# Patient Record
Sex: Female | Born: 1935 | Race: White | Hispanic: No | Marital: Single | Smoking: Never smoker
Health system: Southern US, Community
[De-identification: ages and names within clinical notes are randomized; demographics above are authoritative.]

## PROBLEM LIST (undated history)

## (undated) DIAGNOSIS — M1990 Unspecified osteoarthritis, unspecified site: Secondary | ICD-10-CM

## (undated) DIAGNOSIS — Y92009 Unspecified place in unspecified non-institutional (private) residence as the place of occurrence of the external cause: Secondary | ICD-10-CM

## (undated) DIAGNOSIS — I351 Nonrheumatic aortic (valve) insufficiency: Secondary | ICD-10-CM

## (undated) DIAGNOSIS — I10 Essential (primary) hypertension: Secondary | ICD-10-CM

## (undated) DIAGNOSIS — K219 Gastro-esophageal reflux disease without esophagitis: Secondary | ICD-10-CM

## (undated) DIAGNOSIS — D759 Disease of blood and blood-forming organs, unspecified: Secondary | ICD-10-CM

## (undated) DIAGNOSIS — I059 Rheumatic mitral valve disease, unspecified: Secondary | ICD-10-CM

## (undated) DIAGNOSIS — I8390 Asymptomatic varicose veins of unspecified lower extremity: Secondary | ICD-10-CM

## (undated) DIAGNOSIS — E7800 Pure hypercholesterolemia, unspecified: Secondary | ICD-10-CM

## (undated) DIAGNOSIS — K5790 Diverticulosis of intestine, part unspecified, without perforation or abscess without bleeding: Secondary | ICD-10-CM

## (undated) DIAGNOSIS — W19XXXA Unspecified fall, initial encounter: Secondary | ICD-10-CM

## (undated) HISTORY — DX: Nonrheumatic aortic (valve) insufficiency: I35.1

## (undated) HISTORY — PX: VAGINAL HYSTERECTOMY: SUR661

## (undated) HISTORY — DX: Asymptomatic varicose veins of unspecified lower extremity: I83.90

## (undated) HISTORY — PX: HERNIA REPAIR: SHX51

## (undated) HISTORY — PX: CATARACT EXTRACTION W/ INTRAOCULAR LENS  IMPLANT, BILATERAL: SHX1307

---

## 1998-02-18 ENCOUNTER — Ambulatory Visit (HOSPITAL_COMMUNITY): Admission: RE | Admit: 1998-02-18 | Discharge: 1998-02-18 | Payer: Self-pay | Admitting: Obstetrics and Gynecology

## 1999-01-23 ENCOUNTER — Ambulatory Visit (HOSPITAL_COMMUNITY): Admission: RE | Admit: 1999-01-23 | Discharge: 1999-01-23 | Payer: Self-pay

## 1999-01-23 ENCOUNTER — Encounter: Payer: Self-pay | Admitting: Obstetrics and Gynecology

## 1999-01-30 ENCOUNTER — Ambulatory Visit (HOSPITAL_COMMUNITY): Admission: RE | Admit: 1999-01-30 | Discharge: 1999-01-30 | Payer: Self-pay | Admitting: Obstetrics and Gynecology

## 1999-07-11 ENCOUNTER — Ambulatory Visit (HOSPITAL_COMMUNITY): Admission: RE | Admit: 1999-07-11 | Discharge: 1999-07-11 | Payer: Self-pay | Admitting: Obstetrics and Gynecology

## 1999-07-11 ENCOUNTER — Encounter: Payer: Self-pay | Admitting: Obstetrics and Gynecology

## 1999-12-23 ENCOUNTER — Encounter: Payer: Self-pay | Admitting: Obstetrics and Gynecology

## 1999-12-23 ENCOUNTER — Encounter: Admission: RE | Admit: 1999-12-23 | Discharge: 1999-12-23 | Payer: Self-pay | Admitting: Obstetrics and Gynecology

## 2000-01-06 ENCOUNTER — Ambulatory Visit (HOSPITAL_COMMUNITY): Admission: RE | Admit: 2000-01-06 | Discharge: 2000-01-06 | Payer: Self-pay | Admitting: Gastroenterology

## 2000-12-28 ENCOUNTER — Encounter: Payer: Self-pay | Admitting: Obstetrics and Gynecology

## 2000-12-28 ENCOUNTER — Ambulatory Visit (HOSPITAL_COMMUNITY): Admission: RE | Admit: 2000-12-28 | Discharge: 2000-12-28 | Payer: Self-pay | Admitting: Obstetrics and Gynecology

## 2002-01-04 ENCOUNTER — Ambulatory Visit (HOSPITAL_COMMUNITY): Admission: RE | Admit: 2002-01-04 | Discharge: 2002-01-04 | Payer: Self-pay | Admitting: Obstetrics and Gynecology

## 2002-01-04 ENCOUNTER — Encounter: Payer: Self-pay | Admitting: Obstetrics and Gynecology

## 2003-01-09 ENCOUNTER — Encounter: Payer: Self-pay | Admitting: Obstetrics and Gynecology

## 2003-01-09 ENCOUNTER — Ambulatory Visit (HOSPITAL_COMMUNITY): Admission: RE | Admit: 2003-01-09 | Discharge: 2003-01-09 | Payer: Self-pay | Admitting: Obstetrics and Gynecology

## 2003-05-30 ENCOUNTER — Encounter: Payer: Self-pay | Admitting: Gastroenterology

## 2003-05-30 ENCOUNTER — Encounter: Admission: RE | Admit: 2003-05-30 | Discharge: 2003-05-30 | Payer: Self-pay | Admitting: Gastroenterology

## 2003-07-10 ENCOUNTER — Encounter: Admission: RE | Admit: 2003-07-10 | Discharge: 2003-07-10 | Payer: Self-pay | Admitting: Gastroenterology

## 2004-01-24 ENCOUNTER — Ambulatory Visit (HOSPITAL_COMMUNITY): Admission: RE | Admit: 2004-01-24 | Discharge: 2004-01-24 | Payer: Self-pay | Admitting: Obstetrics and Gynecology

## 2004-09-12 ENCOUNTER — Ambulatory Visit: Payer: Self-pay | Admitting: Hematology & Oncology

## 2005-01-29 ENCOUNTER — Ambulatory Visit (HOSPITAL_COMMUNITY): Admission: RE | Admit: 2005-01-29 | Discharge: 2005-01-29 | Payer: Self-pay | Admitting: Obstetrics and Gynecology

## 2005-03-17 ENCOUNTER — Ambulatory Visit: Payer: Self-pay | Admitting: Hematology & Oncology

## 2005-03-20 ENCOUNTER — Ambulatory Visit (HOSPITAL_COMMUNITY): Admission: RE | Admit: 2005-03-20 | Discharge: 2005-03-20 | Payer: Self-pay | Admitting: Hematology & Oncology

## 2005-09-16 ENCOUNTER — Ambulatory Visit: Payer: Self-pay | Admitting: Hematology & Oncology

## 2005-12-23 ENCOUNTER — Encounter: Payer: Self-pay | Admitting: Obstetrics and Gynecology

## 2006-01-14 ENCOUNTER — Encounter: Admission: RE | Admit: 2006-01-14 | Discharge: 2006-01-14 | Payer: Self-pay | Admitting: Gastroenterology

## 2006-02-03 ENCOUNTER — Ambulatory Visit (HOSPITAL_COMMUNITY): Admission: RE | Admit: 2006-02-03 | Discharge: 2006-02-03 | Payer: Self-pay | Admitting: Obstetrics and Gynecology

## 2006-02-12 ENCOUNTER — Encounter: Admission: RE | Admit: 2006-02-12 | Discharge: 2006-02-12 | Payer: Self-pay | Admitting: Gastroenterology

## 2006-03-30 ENCOUNTER — Ambulatory Visit: Payer: Self-pay | Admitting: Hematology & Oncology

## 2006-04-01 LAB — CBC WITH DIFFERENTIAL/PLATELET
BASO%: 0.4 % (ref 0.0–2.0)
EOS%: 1.7 % (ref 0.0–7.0)
HCT: 40 % (ref 34.8–46.6)
LYMPH%: 19 % (ref 14.0–48.0)
MCH: 33.7 pg (ref 26.0–34.0)
MCHC: 34.7 g/dL (ref 32.0–36.0)
MONO#: 0.8 10*3/uL (ref 0.1–0.9)
NEUT%: 68.7 % (ref 39.6–76.8)
Platelets: 238 10*3/uL (ref 145–400)
RBC: 4.12 10*6/uL (ref 3.70–5.32)
WBC: 8.1 10*3/uL (ref 3.9–10.0)

## 2006-04-02 LAB — PROTEIN ELECTROPHORESIS, SERUM
Gamma Globulin: 17.5 % (ref 11.1–18.8)
M-Spike, %: 0.36 g/dL
Total Protein, Serum Electrophoresis: 7.2 g/dL (ref 6.0–8.3)

## 2006-04-06 LAB — UIFE/LIGHT CHAINS/TP QN, 24-HR UR
Free Kappa Lt Chains,Ur: 0.13 mg/dL (ref 0.04–1.51)
Free Lambda Lt Chains,Ur: <0.09 mg/dL (ref 0.08–1.01)
Free Lt Chn Excr Rate: 2.44 mg/d
Time: 24 hours
Volume, Urine: 1875 mL

## 2006-09-22 ENCOUNTER — Ambulatory Visit: Payer: Self-pay | Admitting: Hematology & Oncology

## 2006-09-27 LAB — CBC WITH DIFFERENTIAL/PLATELET
Basophils Absolute: 0 10*3/uL (ref 0.0–0.1)
EOS%: 1.5 % (ref 0.0–7.0)
HCT: 37.3 % (ref 34.8–46.6)
HGB: 13.2 g/dL (ref 11.6–15.9)
LYMPH%: 18.1 % (ref 14.0–48.0)
MCH: 34 pg (ref 26.0–34.0)
MCV: 96 fL (ref 81.0–101.0)
NEUT%: 72 % (ref 39.6–76.8)
Platelets: 209 10*3/uL (ref 145–400)
lymph#: 1.3 10*3/uL (ref 0.9–3.3)

## 2006-09-27 LAB — COMPREHENSIVE METABOLIC PANEL
AST: 27 U/L (ref 0–37)
BUN: 17 mg/dL (ref 6–23)
Calcium: 9.2 mg/dL (ref 8.4–10.5)
Chloride: 105 mEq/L (ref 96–112)
Creatinine, Ser: 0.77 mg/dL (ref 0.40–1.20)
Total Bilirubin: 0.3 mg/dL (ref 0.3–1.2)

## 2006-09-27 LAB — CHCC SMEAR

## 2006-09-29 LAB — PROTEIN ELECTROPHORESIS, SERUM
Albumin ELP: 57.9 % (ref 55.8–66.1)
Alpha-1-Globulin: 4.2 % (ref 2.9–4.9)
Alpha-2-Globulin: 9.6 % (ref 7.1–11.8)
Beta 2: 4.5 % (ref 3.2–6.5)
Beta Globulin: 6.5 % (ref 4.7–7.2)
Gamma Globulin: 17.3 % (ref 11.1–18.8)
M-Spike, %: 0.46 g/dL
Total Protein, Serum Electrophoresis: 6.9 g/dL (ref 6.0–8.3)

## 2006-09-29 LAB — IGG, IGA, IGM
IgA: 267 mg/dL (ref 68–378)
IgG (Immunoglobin G), Serum: 1150 mg/dL (ref 694–1618)
IgM, Serum: 148 mg/dL (ref 60–263)

## 2007-02-09 ENCOUNTER — Ambulatory Visit (HOSPITAL_COMMUNITY): Admission: RE | Admit: 2007-02-09 | Discharge: 2007-02-09 | Payer: Self-pay | Admitting: Obstetrics and Gynecology

## 2007-03-24 ENCOUNTER — Ambulatory Visit: Payer: Self-pay | Admitting: Hematology & Oncology

## 2007-04-01 LAB — CBC WITH DIFFERENTIAL/PLATELET
Eosinophils Absolute: 0.1 10*3/uL (ref 0.0–0.5)
MONO#: 0.8 10*3/uL (ref 0.1–0.9)
NEUT#: 5.5 10*3/uL (ref 1.5–6.5)
RBC: 3.84 10*6/uL (ref 3.70–5.32)
RDW: 13 % (ref 11.3–14.5)
WBC: 7.9 10*3/uL (ref 3.9–10.0)
lymph#: 1.4 10*3/uL (ref 0.9–3.3)

## 2007-04-05 LAB — PROTEIN ELECTROPHORESIS, SERUM
Albumin ELP: 57.9 % (ref 55.8–66.1)
Alpha-1-Globulin: 4.4 % (ref 2.9–4.9)
Alpha-2-Globulin: 9 % (ref 7.1–11.8)
Beta Globulin: 6.7 % (ref 4.7–7.2)
Total Protein, Serum Electrophoresis: 7 g/dL (ref 6.0–8.3)

## 2007-11-14 ENCOUNTER — Encounter: Admission: RE | Admit: 2007-11-14 | Discharge: 2007-11-14 | Payer: Self-pay | Admitting: Gastroenterology

## 2008-02-10 ENCOUNTER — Ambulatory Visit (HOSPITAL_COMMUNITY): Admission: RE | Admit: 2008-02-10 | Discharge: 2008-02-10 | Payer: Self-pay | Admitting: Obstetrics and Gynecology

## 2008-03-22 ENCOUNTER — Ambulatory Visit (HOSPITAL_COMMUNITY): Admission: RE | Admit: 2008-03-22 | Discharge: 2008-03-22 | Payer: Self-pay | Admitting: Obstetrics and Gynecology

## 2008-04-04 ENCOUNTER — Ambulatory Visit: Payer: Self-pay | Admitting: Hematology & Oncology

## 2008-04-23 LAB — CBC WITH DIFFERENTIAL (CANCER CENTER ONLY)
BASO%: 0.5 % (ref 0.0–2.0)
LYMPH%: 25.3 % (ref 14.0–48.0)
MCH: 33.5 pg (ref 26.0–34.0)
MCHC: 35 g/dL (ref 32.0–36.0)
MCV: 96 fL (ref 81–101)
MONO%: 6.8 % (ref 0.0–13.0)
Platelets: 216 10*3/uL (ref 145–400)
RDW: 10.9 % (ref 10.5–14.6)
WBC: 6 10*3/uL (ref 3.9–10.0)

## 2008-04-24 ENCOUNTER — Ambulatory Visit (HOSPITAL_BASED_OUTPATIENT_CLINIC_OR_DEPARTMENT_OTHER): Admission: RE | Admit: 2008-04-24 | Discharge: 2008-04-24 | Payer: Self-pay | Admitting: Hematology & Oncology

## 2008-04-25 LAB — PROTEIN ELECTROPHORESIS, SERUM
Alpha-1-Globulin: 4.4 % (ref 2.9–4.9)
Beta 2: 5 % (ref 3.2–6.5)
Gamma Globulin: 16.6 % (ref 11.1–18.8)
M-Spike, %: 0.53 g/dL

## 2008-06-07 ENCOUNTER — Emergency Department (HOSPITAL_BASED_OUTPATIENT_CLINIC_OR_DEPARTMENT_OTHER): Admission: EM | Admit: 2008-06-07 | Discharge: 2008-06-07 | Payer: Self-pay | Admitting: Emergency Medicine

## 2008-10-23 ENCOUNTER — Ambulatory Visit: Payer: Self-pay | Admitting: Hematology & Oncology

## 2008-10-25 LAB — CBC WITH DIFFERENTIAL (CANCER CENTER ONLY)
BASO#: 0 10*3/uL (ref 0.0–0.2)
Eosinophils Absolute: 0.2 10*3/uL (ref 0.0–0.5)
LYMPH%: 21.8 % (ref 14.0–48.0)
MCH: 32.9 pg (ref 26.0–34.0)
MCV: 100 fL (ref 81–101)
MONO%: 6.5 % (ref 0.0–13.0)
NEUT%: 68.7 % (ref 39.6–80.0)
Platelets: 189 10*3/uL (ref 145–400)
RBC: 4.09 10*6/uL (ref 3.70–5.32)

## 2008-10-29 LAB — PROTEIN ELECTROPHORESIS, SERUM
Beta 2: 4.5 % (ref 3.2–6.5)
Beta Globulin: 6.4 % (ref 4.7–7.2)
Gamma Globulin: 16.6 % (ref 11.1–18.8)

## 2008-10-29 LAB — COMPREHENSIVE METABOLIC PANEL
Albumin: 4 g/dL (ref 3.5–5.2)
BUN: 17 mg/dL (ref 6–23)
CO2: 27 mEq/L (ref 19–32)
Chloride: 102 mEq/L (ref 96–112)
Creatinine, Ser: 0.72 mg/dL (ref 0.40–1.20)
Glucose, Bld: 77 mg/dL (ref 70–99)

## 2009-02-12 ENCOUNTER — Ambulatory Visit (HOSPITAL_BASED_OUTPATIENT_CLINIC_OR_DEPARTMENT_OTHER): Admission: RE | Admit: 2009-02-12 | Discharge: 2009-02-12 | Payer: Self-pay | Admitting: Obstetrics and Gynecology

## 2009-02-12 ENCOUNTER — Ambulatory Visit: Payer: Self-pay | Admitting: Diagnostic Radiology

## 2009-05-07 ENCOUNTER — Ambulatory Visit: Payer: Self-pay | Admitting: Hematology & Oncology

## 2009-05-09 LAB — CBC WITH DIFFERENTIAL (CANCER CENTER ONLY)
BASO#: 0.1 10*3/uL (ref 0.0–0.2)
Eosinophils Absolute: 0.1 10*3/uL (ref 0.0–0.5)
HGB: 13.1 g/dL (ref 11.6–15.9)
MCH: 34 pg (ref 26.0–34.0)
MCV: 98 fL (ref 81–101)
MONO%: 9.3 % (ref 0.0–13.0)
NEUT#: 4.4 10*3/uL (ref 1.5–6.5)
RBC: 3.85 10*6/uL (ref 3.70–5.32)

## 2009-05-13 LAB — KAPPA/LAMBDA LIGHT CHAINS
Kappa free light chain: 1.16 mg/dL (ref 0.33–1.94)
Kappa:Lambda Ratio: 0.5 (ref 0.26–1.65)
Lambda Free Lght Chn: 2.33 mg/dL (ref 0.57–2.63)

## 2009-05-13 LAB — SPEP & IFE WITH QIG
Albumin ELP: 55.8 % (ref 55.8–66.1)
Alpha-1-Globulin: 5.1 % — ABNORMAL HIGH (ref 2.9–4.9)
Beta 2: 4.9 % (ref 3.2–6.5)
Beta Globulin: 6.6 % (ref 4.7–7.2)

## 2009-08-24 HISTORY — PX: PATELLA FRACTURE SURGERY: SHX735

## 2009-11-13 ENCOUNTER — Ambulatory Visit: Payer: Self-pay | Admitting: Hematology & Oncology

## 2009-11-14 ENCOUNTER — Ambulatory Visit: Payer: Self-pay | Admitting: Diagnostic Radiology

## 2009-11-14 ENCOUNTER — Ambulatory Visit (HOSPITAL_BASED_OUTPATIENT_CLINIC_OR_DEPARTMENT_OTHER): Admission: RE | Admit: 2009-11-14 | Discharge: 2009-11-14 | Payer: Self-pay | Admitting: Hematology & Oncology

## 2009-11-14 LAB — CBC WITH DIFFERENTIAL (CANCER CENTER ONLY)
BASO#: 0 10*3/uL (ref 0.0–0.2)
Eosinophils Absolute: 0.2 10*3/uL (ref 0.0–0.5)
HCT: 39.1 % (ref 34.8–46.6)
LYMPH%: 26.4 % (ref 14.0–48.0)
MCH: 32.7 pg (ref 26.0–34.0)
MCV: 98 fL (ref 81–101)
MONO#: 0.5 10*3/uL (ref 0.1–0.9)
MONO%: 8.6 % (ref 0.0–13.0)
NEUT%: 61.7 % (ref 39.6–80.0)
RBC: 4 10*6/uL (ref 3.70–5.32)
WBC: 6.2 10*3/uL (ref 3.9–10.0)

## 2009-11-18 LAB — SPEP & IFE WITH QIG
Alpha-2-Globulin: 10.3 % (ref 7.1–11.8)
Gamma Globulin: 16.6 % (ref 11.1–18.8)
IgA: 281 mg/dL (ref 68–378)
IgG (Immunoglobin G), Serum: 1200 mg/dL (ref 694–1618)
IgM, Serum: 201 mg/dL (ref 60–263)
M-Spike, %: 0.56 g/dL

## 2009-11-18 LAB — KAPPA/LAMBDA LIGHT CHAINS: Kappa:Lambda Ratio: 0.76 (ref 0.26–1.65)

## 2010-05-06 ENCOUNTER — Ambulatory Visit: Payer: Self-pay | Admitting: Hematology & Oncology

## 2010-05-21 ENCOUNTER — Emergency Department (HOSPITAL_BASED_OUTPATIENT_CLINIC_OR_DEPARTMENT_OTHER)
Admission: EM | Admit: 2010-05-21 | Discharge: 2010-05-21 | Payer: Self-pay | Source: Home / Self Care | Admitting: Emergency Medicine

## 2010-05-21 ENCOUNTER — Ambulatory Visit: Payer: Self-pay | Admitting: Diagnostic Radiology

## 2010-06-11 ENCOUNTER — Ambulatory Visit: Payer: Self-pay | Admitting: Hematology & Oncology

## 2010-06-12 LAB — CBC WITH DIFFERENTIAL (CANCER CENTER ONLY)
BASO#: 0 10*3/uL (ref 0.0–0.2)
Eosinophils Absolute: 0.1 10*3/uL (ref 0.0–0.5)
HGB: 11.6 g/dL (ref 11.6–15.9)
MONO#: 0.7 10*3/uL (ref 0.1–0.9)
NEUT#: 5.7 10*3/uL (ref 1.5–6.5)
Platelets: 213 10*3/uL (ref 145–400)
RBC: 3.51 10*6/uL — ABNORMAL LOW (ref 3.70–5.32)
WBC: 8.2 10*3/uL (ref 3.9–10.0)

## 2010-06-12 LAB — CHCC SATELLITE - SMEAR

## 2010-06-16 LAB — PROTEIN ELECTROPHORESIS, SERUM
Alpha-2-Globulin: 11.2 % (ref 7.1–11.8)
Gamma Globulin: 15.8 % (ref 11.1–18.8)
M-Spike, %: 0.48 g/dL

## 2010-06-16 LAB — COMPREHENSIVE METABOLIC PANEL
ALT: <8 U/L (ref 0–35)
Albumin: 4.1 g/dL (ref 3.5–5.2)
CO2: 25 mEq/L (ref 19–32)
Calcium: 9.5 mg/dL (ref 8.4–10.5)
Chloride: 103 mEq/L (ref 96–112)
Potassium: 4.2 mEq/L (ref 3.5–5.3)
Sodium: 140 mEq/L (ref 135–145)
Total Protein: 7 g/dL (ref 6.0–8.3)

## 2010-06-16 LAB — KAPPA/LAMBDA LIGHT CHAINS
Kappa free light chain: 1.35 mg/dL (ref 0.33–1.94)
Kappa:Lambda Ratio: 0.63 (ref 0.26–1.65)
Lambda Free Lght Chn: 2.15 mg/dL (ref 0.57–2.63)

## 2010-06-16 LAB — LACTATE DEHYDROGENASE: LDH: 166 U/L (ref 94–250)

## 2010-06-26 ENCOUNTER — Ambulatory Visit (HOSPITAL_BASED_OUTPATIENT_CLINIC_OR_DEPARTMENT_OTHER): Admission: RE | Admit: 2010-06-26 | Discharge: 2010-06-26 | Payer: Self-pay | Admitting: Obstetrics and Gynecology

## 2010-06-26 ENCOUNTER — Ambulatory Visit: Payer: Self-pay | Admitting: Diagnostic Radiology

## 2010-08-05 ENCOUNTER — Encounter
Admission: RE | Admit: 2010-08-05 | Discharge: 2010-08-05 | Payer: Self-pay | Source: Home / Self Care | Attending: Rheumatology | Admitting: Rheumatology

## 2010-09-02 ENCOUNTER — Encounter
Admission: RE | Admit: 2010-09-02 | Discharge: 2010-09-02 | Payer: Self-pay | Source: Home / Self Care | Attending: Rheumatology | Admitting: Rheumatology

## 2010-09-14 ENCOUNTER — Encounter: Payer: Self-pay | Admitting: Gastroenterology

## 2010-11-06 LAB — CBC
HCT: 33.5 % — ABNORMAL LOW (ref 36.0–46.0)
Hemoglobin: 11.3 g/dL — ABNORMAL LOW (ref 12.0–15.0)
MCHC: 33.8 g/dL (ref 30.0–36.0)

## 2010-11-06 LAB — DIFFERENTIAL
Basophils Relative: 0 % (ref 0–1)
Lymphocytes Relative: 6 % — ABNORMAL LOW (ref 12–46)
Lymphs Abs: 0.6 10*3/uL — ABNORMAL LOW (ref 0.7–4.0)
Monocytes Absolute: 0.8 10*3/uL (ref 0.1–1.0)
Monocytes Relative: 8 % (ref 3–12)
Neutro Abs: 8.7 10*3/uL — ABNORMAL HIGH (ref 1.7–7.7)

## 2010-11-06 LAB — BASIC METABOLIC PANEL
GFR calc non Af Amer: >60 mL/min (ref >60)
Glucose, Bld: 115 mg/dL — ABNORMAL HIGH (ref 70–99)
Potassium: 4.5 mEq/L (ref 3.5–5.1)
Sodium: 141 mEq/L (ref 135–145)

## 2010-12-18 ENCOUNTER — Encounter (HOSPITAL_BASED_OUTPATIENT_CLINIC_OR_DEPARTMENT_OTHER): Payer: Medicare Other | Admitting: Hematology & Oncology

## 2010-12-18 ENCOUNTER — Other Ambulatory Visit: Payer: Self-pay | Admitting: Hematology & Oncology

## 2010-12-18 DIAGNOSIS — D472 Monoclonal gammopathy: Secondary | ICD-10-CM

## 2010-12-18 LAB — CBC WITH DIFFERENTIAL (CANCER CENTER ONLY)
BASO%: 0.3 % (ref 0.0–2.0)
EOS%: 1.3 % (ref 0.0–7.0)
LYMPH#: 1.4 10*3/uL (ref 0.9–3.3)
MCHC: 33.2 g/dL (ref 32.0–36.0)
MONO#: 0.9 10*3/uL (ref 0.1–0.9)
Platelets: 229 10*3/uL (ref 145–400)
RDW: 12.2 % (ref 11.1–15.7)
WBC: 9.4 10*3/uL (ref 3.9–10.0)

## 2010-12-22 LAB — SPEP & IFE WITH QIG
Alpha-2-Globulin: 12.9 % — ABNORMAL HIGH (ref 7.1–11.8)
Beta 2: 4.5 % (ref 3.2–6.5)
Gamma Globulin: 13.2 % (ref 11.1–18.8)
IgG (Immunoglobin G), Serum: 841 mg/dL (ref 694–1618)
IgM, Serum: 206 mg/dL (ref 60–263)
M-Spike, %: 0.38 g/dL

## 2010-12-22 LAB — KAPPA/LAMBDA LIGHT CHAINS
Kappa free light chain: 1.91 mg/dL (ref 0.33–1.94)
Kappa:Lambda Ratio: 0.7 (ref 0.26–1.65)
Lambda Free Lght Chn: 2.72 mg/dL — ABNORMAL HIGH (ref 0.57–2.63)

## 2010-12-22 LAB — COMPREHENSIVE METABOLIC PANEL
BUN: 21 mg/dL (ref 6–23)
CO2: 24 mEq/L (ref 19–32)
Calcium: 9.5 mg/dL (ref 8.4–10.5)
Chloride: 101 mEq/L (ref 96–112)
Creatinine, Ser: 0.8 mg/dL (ref 0.40–1.20)
Glucose, Bld: 89 mg/dL (ref 70–99)

## 2010-12-30 ENCOUNTER — Ambulatory Visit: Payer: Medicare Other | Admitting: Physical Therapy

## 2011-01-06 ENCOUNTER — Ambulatory Visit: Payer: Medicare Other | Attending: Hematology & Oncology | Admitting: Physical Therapy

## 2011-01-06 DIAGNOSIS — M545 Low back pain, unspecified: Secondary | ICD-10-CM | POA: Insufficient documentation

## 2011-01-06 DIAGNOSIS — IMO0001 Reserved for inherently not codable concepts without codable children: Secondary | ICD-10-CM | POA: Insufficient documentation

## 2011-01-06 DIAGNOSIS — M2560 Stiffness of unspecified joint, not elsewhere classified: Secondary | ICD-10-CM | POA: Insufficient documentation

## 2011-01-06 DIAGNOSIS — M25519 Pain in unspecified shoulder: Secondary | ICD-10-CM | POA: Insufficient documentation

## 2011-01-09 NOTE — Procedures (Signed)
[place]  Patient:    [name], [name]                       MRN: [id number] Proc. Date: 01/06/00 Adm. Date:  [id number] Attending:  Orland Mustard                           Procedure Report  PROCEDURE:  Colonoscopy.  MEDICATIONS:  Fentanyl 70 mcg, Versed 6 mg IV.  INDICATIONS:  Patient has had what was felt as clinical diverticular disease with left lower quadrant pain.  Due to continued symptoms, a colonoscopy was performed.  DESCRIPTION OF SCOPE:  The Olympus pediatric video colonoscope.  DESCRIPTION OF PROCEDURE:  Procedure has been explained to patient, consent obtained.  Patient in left lateral decubitus position.  The pediatric Olympus video colonoscope was inserted and advanced under direct visualization.  The patient had marked diverticular disease of the sigmoid colon.  Eventually, we were able to pass this with the scope using abdominal pressure and position changes.  Finally, the patient in supine position, we were able to advance to the cecum.  Ileocecal valve and appendiceal orifice were seen and the right lower quadrant was transilluminated.  Scope withdrawn.  Cecum, ascending colon, hepatic flexure, transverse colon, splenic flexure, descending and sigmoid colon were seen well.  The sigmoid colon, again marked diverticular disease was seen.  No polyps or other lesions were seen.  The scope was withdrawn.  The patient tolerated the procedure well, was maintained on low-flow oxygen and pulse oximeter throughout the procedure with no obvious problem.  ASSESSMENT:  Marked diverticulosis of the sigmoid colon.  Patient probably had left lower quadrant pain on the basis of low-grade diverticulitis.  No active diverticulitis currently.  PLAN:  Will give information about diverticular disease, start on fiber supplements daily, see back in the office in six weeks. DD:  01/06/00 TD:  01/06/00 Job: [id number]

## 2011-01-13 ENCOUNTER — Ambulatory Visit: Payer: Medicare Other | Admitting: Physical Therapy

## 2011-01-15 ENCOUNTER — Ambulatory Visit: Payer: Medicare Other | Admitting: Physical Therapy

## 2011-01-20 ENCOUNTER — Ambulatory Visit: Payer: Medicare Other | Admitting: Physical Therapy

## 2011-01-22 ENCOUNTER — Ambulatory Visit: Payer: Medicare Other | Admitting: Physical Therapy

## 2011-01-27 ENCOUNTER — Ambulatory Visit: Payer: Medicare Other | Attending: Hematology & Oncology | Admitting: Physical Therapy

## 2011-01-27 DIAGNOSIS — M25519 Pain in unspecified shoulder: Secondary | ICD-10-CM | POA: Insufficient documentation

## 2011-01-27 DIAGNOSIS — M545 Low back pain, unspecified: Secondary | ICD-10-CM | POA: Insufficient documentation

## 2011-01-27 DIAGNOSIS — M2560 Stiffness of unspecified joint, not elsewhere classified: Secondary | ICD-10-CM | POA: Insufficient documentation

## 2011-01-27 DIAGNOSIS — IMO0001 Reserved for inherently not codable concepts without codable children: Secondary | ICD-10-CM | POA: Insufficient documentation

## 2011-01-29 ENCOUNTER — Ambulatory Visit: Payer: Medicare Other | Admitting: Physical Therapy

## 2011-02-04 ENCOUNTER — Ambulatory Visit: Payer: Medicare Other | Admitting: Physical Therapy

## 2011-02-11 ENCOUNTER — Ambulatory Visit: Payer: Medicare Other | Admitting: Physical Therapy

## 2011-02-13 ENCOUNTER — Ambulatory Visit: Payer: Medicare Other | Admitting: Physical Therapy

## 2011-02-16 ENCOUNTER — Ambulatory Visit: Payer: Medicare Other | Admitting: Physical Therapy

## 2011-02-18 ENCOUNTER — Ambulatory Visit: Payer: Medicare Other | Admitting: Physical Therapy

## 2011-02-23 ENCOUNTER — Ambulatory Visit: Payer: Medicare Other | Attending: Hematology & Oncology | Admitting: Physical Therapy

## 2011-02-23 DIAGNOSIS — M25519 Pain in unspecified shoulder: Secondary | ICD-10-CM | POA: Insufficient documentation

## 2011-02-23 DIAGNOSIS — IMO0001 Reserved for inherently not codable concepts without codable children: Secondary | ICD-10-CM | POA: Insufficient documentation

## 2011-02-23 DIAGNOSIS — M545 Low back pain, unspecified: Secondary | ICD-10-CM | POA: Insufficient documentation

## 2011-02-23 DIAGNOSIS — M2560 Stiffness of unspecified joint, not elsewhere classified: Secondary | ICD-10-CM | POA: Insufficient documentation

## 2011-02-26 ENCOUNTER — Ambulatory Visit: Payer: Medicare Other | Admitting: Physical Therapy

## 2011-03-01 ENCOUNTER — Emergency Department (INDEPENDENT_AMBULATORY_CARE_PROVIDER_SITE_OTHER): Payer: Medicare Other

## 2011-03-01 ENCOUNTER — Emergency Department (HOSPITAL_BASED_OUTPATIENT_CLINIC_OR_DEPARTMENT_OTHER)
Admission: EM | Admit: 2011-03-01 | Discharge: 2011-03-02 | Disposition: A | Discharge status: Other Acute Inpatient Hospital | Payer: Medicare Other | Source: Home / Self Care | Attending: Emergency Medicine | Admitting: Emergency Medicine

## 2011-03-01 ENCOUNTER — Encounter: Payer: Self-pay | Admitting: *Deleted

## 2011-03-01 ENCOUNTER — Other Ambulatory Visit: Payer: Self-pay

## 2011-03-01 DIAGNOSIS — T148XXA Other injury of unspecified body region, initial encounter: Secondary | ICD-10-CM

## 2011-03-01 DIAGNOSIS — S1093XA Contusion of unspecified part of neck, initial encounter: Secondary | ICD-10-CM

## 2011-03-01 DIAGNOSIS — W010XXA Fall on same level from slipping, tripping and stumbling without subsequent striking against object, initial encounter: Secondary | ICD-10-CM

## 2011-03-01 DIAGNOSIS — S82009A Unspecified fracture of unspecified patella, initial encounter for closed fracture: Secondary | ICD-10-CM

## 2011-03-01 DIAGNOSIS — S0003XA Contusion of scalp, initial encounter: Secondary | ICD-10-CM

## 2011-03-01 DIAGNOSIS — R609 Edema, unspecified: Secondary | ICD-10-CM

## 2011-03-01 DIAGNOSIS — S8000XA Contusion of unspecified knee, initial encounter: Secondary | ICD-10-CM

## 2011-03-01 DIAGNOSIS — W19XXXA Unspecified fall, initial encounter: Secondary | ICD-10-CM

## 2011-03-01 DIAGNOSIS — Z01818 Encounter for other preprocedural examination: Secondary | ICD-10-CM

## 2011-03-01 DIAGNOSIS — M25569 Pain in unspecified knee: Secondary | ICD-10-CM

## 2011-03-01 HISTORY — DX: Pure hypercholesterolemia, unspecified: E78.00

## 2011-03-01 HISTORY — DX: Essential (primary) hypertension: I10

## 2011-03-01 LAB — CBC
MCHC: 34.5 g/dL (ref 30.0–36.0)
MCV: 94.8 fL (ref 78.0–100.0)
Platelets: 193 10*3/uL (ref 150–400)
RDW: 12.4 % (ref 11.5–15.5)
WBC: 11.5 10*3/uL — ABNORMAL HIGH (ref 4.0–10.5)

## 2011-03-01 LAB — BASIC METABOLIC PANEL
BUN: 18 mg/dL (ref 6–23)
Calcium: 10.5 mg/dL (ref 8.4–10.5)
Chloride: 99 mEq/L (ref 96–112)
Creatinine, Ser: 0.7 mg/dL (ref 0.50–1.10)
GFR calc Af Amer: >60 mL/min (ref >60)
GFR calc non Af Amer: >60 mL/min (ref >60)

## 2011-03-01 LAB — PROTIME-INR: Prothrombin Time: 13.4 seconds (ref 11.6–15.2)

## 2011-03-01 MED ORDER — ONDANSETRON HCL 4 MG/2ML IJ SOLN
4.0000 mg | Freq: Once | INTRAMUSCULAR | Status: AC
  Administered 2011-03-01: 4 mg via INTRAVENOUS
  Filled 2011-03-01: qty 2

## 2011-03-01 MED ORDER — MORPHINE SULFATE 4 MG/ML IJ SOLN
4.0000 mg | Freq: Once | INTRAMUSCULAR | Status: AC
  Administered 2011-03-01: 4 mg via INTRAVENOUS
  Filled 2011-03-01: qty 1

## 2011-03-01 NOTE — ED Notes (Signed)
Prior to pt leaving, she began to c/o CP. [facility] personnel out to desk. Dr. Ignacia Palma advised and EKG requested and being done. Xray also advised of pt's need for a C-Spine xray. They spoke with Dr. Ignacia Palma and the order was changed.

## 2011-03-01 NOTE — ED Notes (Signed)
MD at bedside. 

## 2011-03-01 NOTE — ED Provider Notes (Addendum)
History     Chief Complaint  Patient presents with  . Fall    Patient tripped over dog's leash and hit R knee & face, hurts to stand or touch knee   Patient is a 77 y.o. female presenting with fall. The history is provided by the patient.  Fall The accident occurred 3 to 5 hours ago. The fall occurred while walking. She landed on a hard floor. The point of impact was the right knee and head. The pain is present in the right knee. The pain is moderate. She was not ambulatory at the scene. Pertinent negatives include no fever, no numbness, no abdominal pain, no vomiting, no headaches and no loss of consciousness. The symptoms are aggravated by ambulation.   Shortly pta, pt tripped over dogs leash at home, falling onto right knee and hitting face/nose on ground. No loc. No faintness or dizziness. C/o pain to right knee, and nose. No headache. No neck or back pain. No nv. Hurts to bend knee or try to stand. No numbness/weakness. No coumadin use.    Past Medical History  Diagnosis Date  . Hypertension   . High cholesterol     Past Surgical History  Procedure Date  . Abdominal hysterectomy     No family history on file.  History  Substance Use Topics  . Smoking status: Never Smoker   . Smokeless tobacco: Not on file  . Alcohol Use: Yes     very rarely    OB History    Grav Para Term Preterm Abortions TAB SAB Ect Mult Living                  Review of Systems  Constitutional: Negative for fever and chills.  HENT: Negative for neck pain.   Eyes: Negative for pain.  Respiratory: Negative for shortness of breath.   Cardiovascular: Negative for chest pain.  Gastrointestinal: Negative for vomiting and abdominal pain.  Genitourinary: Negative for dysuria.  Musculoskeletal: Negative for arthralgias.  Skin: Negative for rash.  Neurological: Negative for loss of consciousness, numbness and headaches.  Hematological: Does not bruise/bleed easily.  Psychiatric/Behavioral: Negative  for behavioral problems.    Physical Exam  BP 179/77  Pulse 77  Temp(Src) 98.1 F (36.7 C) (Oral)  Resp 18  SpO2 99%  Physical Exam  Nursing note and vitals reviewed. Constitutional: She appears well-developed and well-nourished. No distress.  HENT:       sts and tenderness to nose. No septal hematoma. Other facial bones/orbits intact. No malocclusion. Teeth intact. -  Eyes: Conjunctivae are normal. Pupils are equal, round, and reactive to light. No scleral icterus.  Neck: Neck supple. No tracheal deviation present.  Cardiovascular: Normal rate.   Pulmonary/Chest: Effort normal. No respiratory distress.  Abdominal: Normal appearance. She exhibits no distension.  Musculoskeletal: She exhibits no edema.       Cervical back: She exhibits no tenderness.       Thoracic back: She exhibits no tenderness.       Lumbar back: She exhibits no tenderness.       Right knee sts and tenderness. rom limited by pain/swelling. Distal pulses palp. No pain w rom at hip and ankle. Skin intact.   Neurological: She is alert.  Skin: Skin is warm and dry. No rash noted.  Psychiatric: She has a normal mood and affect.    ED Course  Procedures  MDM Icepack/elevation to knee. xrays ordered. 1015 xrays pending, signed out to dr Ignacia Palma to check xrays  when back, recheck pt, dispo appropriately. Morphine iv. Icepack. Pain improved. Discussed pt with ddr duda who accepts in transfer to [facility]. Pt agreeable w transfer to [facility]. States has no pcp or othopedist.   Discussed plan of transfer w Dr Ignacia Palma and that labs remain pending - will check when back.       Suzi Roots, MD 03/01/11 2149  Suzi Roots, MD 03/01/11 4132  Suzi Roots, MD 03/01/11 4401  Suzi Roots, MD 03/01/11 0272  Suzi Roots, MD 03/01/11 5366  Suzi Roots, MD 03/11/11 [phone]

## 2011-03-01 NOTE — ED Notes (Signed)
Given ice pack

## 2011-03-01 NOTE — ED Notes (Signed)
UEA:VW09<WJ> Expected date:03/01/11<BR> Expected time: 7:48 PM<BR> Means of arrival:Ambulance<BR> Comments:<BR> [facility] 77 yo fall, knee injury, no obvious deformity, no head injury.

## 2011-03-01 NOTE — ED Provider Notes (Addendum)
History     Chief Complaint  Patient presents with  . Fall    Patient tripped over dog's leash and hit R knee & face, hurts to stand or touch knee   HPI  Past Medical History  Diagnosis Date  . Hypertension   . High cholesterol     Past Surgical History  Procedure Date  . Abdominal hysterectomy     No family history on file.  History  Substance Use Topics  . Smoking status: Never Smoker   . Smokeless tobacco: Not on file  . Alcohol Use: Yes     very rarely    OB History    Grav Para Term Preterm Abortions TAB SAB Ect Mult Living                  Review of Systems  Physical Exam  BP 164/77  Pulse 80  Temp(Src) 98.2 F (36.8 C) (Oral)  Resp 28  SpO2 100%  Physical Exam  ED Course  Procedures  MDM Lab, EKG, and CT of C-spine all negative.  Pt transferred.      Carleene Cooper III, MD 03/02/11 0101  Carleene Cooper III, MD 03/25/11 1818  Carleene Cooper III, MD 03/29/11 0454  Carleene Cooper III, MD 03/29/11 [phone]

## 2011-03-02 ENCOUNTER — Encounter (HOSPITAL_BASED_OUTPATIENT_CLINIC_OR_DEPARTMENT_OTHER): Payer: Self-pay | Admitting: *Deleted

## 2011-03-02 ENCOUNTER — Ambulatory Visit: Payer: Medicare Other | Admitting: Physical Therapy

## 2011-03-02 ENCOUNTER — Inpatient Hospital Stay (HOSPITAL_COMMUNITY)
Admission: EM | Admit: 2011-03-02 | Discharge: 2011-03-05 | DRG: 494 | Disposition: A | Discharge status: Skilled Nursing Facility | Payer: Medicare Other | Source: Other Acute Inpatient Hospital | Attending: Orthopedic Surgery | Admitting: Orthopedic Surgery

## 2011-03-02 ENCOUNTER — Emergency Department (INDEPENDENT_AMBULATORY_CARE_PROVIDER_SITE_OTHER): Payer: Medicare Other

## 2011-03-02 DIAGNOSIS — W010XXA Fall on same level from slipping, tripping and stumbling without subsequent striking against object, initial encounter: Secondary | ICD-10-CM

## 2011-03-02 DIAGNOSIS — Z01812 Encounter for preprocedural laboratory examination: Secondary | ICD-10-CM

## 2011-03-02 DIAGNOSIS — M5020 Other cervical disc displacement, unspecified cervical region: Secondary | ICD-10-CM

## 2011-03-02 DIAGNOSIS — M542 Cervicalgia: Secondary | ICD-10-CM

## 2011-03-02 DIAGNOSIS — W19XXXA Unspecified fall, initial encounter: Secondary | ICD-10-CM | POA: Diagnosis present

## 2011-03-02 DIAGNOSIS — S0003XA Contusion of scalp, initial encounter: Secondary | ICD-10-CM | POA: Diagnosis present

## 2011-03-02 DIAGNOSIS — S1093XA Contusion of unspecified part of neck, initial encounter: Secondary | ICD-10-CM | POA: Diagnosis present

## 2011-03-02 DIAGNOSIS — I10 Essential (primary) hypertension: Secondary | ICD-10-CM | POA: Diagnosis present

## 2011-03-02 DIAGNOSIS — M47812 Spondylosis without myelopathy or radiculopathy, cervical region: Secondary | ICD-10-CM

## 2011-03-02 DIAGNOSIS — S82009A Unspecified fracture of unspecified patella, initial encounter for closed fracture: Principal | ICD-10-CM | POA: Diagnosis present

## 2011-03-02 LAB — SURGICAL PCR SCREEN: Staphylococcus aureus: NEGATIVE

## 2011-03-02 NOTE — ED Notes (Signed)
Pt was admitted to [facility] Rm 5023 and transferrred there by [facility].

## 2011-03-03 DIAGNOSIS — S82009A Unspecified fracture of unspecified patella, initial encounter for closed fracture: Secondary | ICD-10-CM

## 2011-03-03 LAB — PROTIME-INR: Prothrombin Time: 14.2 seconds (ref 11.6–15.2)

## 2011-03-04 ENCOUNTER — Encounter: Payer: Medicare Other | Admitting: Physical Therapy

## 2011-03-04 NOTE — Discharge Summary (Signed)
  NAMEMOIRA, UMHOLTZ                ACCOUNT NO.:  [id number]  MEDICAL RECORD NO.:  [id number]  LOCATION:  5023                         FACILITY:  [facility]  PHYSICIAN:  Nadara Mustard, MD     DATE OF BIRTH:  [birth year]  DATE OF ADMISSION:  03/02/2011 DATE OF DISCHARGE:  03/04/2011                              DISCHARGE SUMMARY   FINAL DIAGNOSIS:  Transverse right mid patella fracture.  Discharged to skilled nursing in stable condition.  Physical therapy, progressive ambulation, weightbearing as tolerated with the knee immobilizer with the Bledsoe brace locked in extension.  Dry dressing changes p.r.n.  May shower.  May get incision wet.  Discharge medications as per her admission medications plus a prescription for Percocet for pain and Coumadin for DVT prophylaxis.  Follow up with Dr. Lajoyce Corners in 2 weeks.     Nadara Mustard, MD     MVD/MEDQ  D:  03/04/2011  T:  03/04/2011  Job:  [id number]  Electronically Signed by Aldean Baker MD on 03/04/2011 09:42:57 AM

## 2011-03-04 NOTE — Op Note (Signed)
  NAMEJAYLEAN, BUENAVENTURA                ACCOUNT NO.:  [id number]  MEDICAL RECORD NO.:  [id number]  LOCATION:  5023                         FACILITY:  [facility]  PHYSICIAN:  Nadara Mustard, MD     DATE OF BIRTH:  [birth year]  DATE OF PROCEDURE:  03/02/2011 DATE OF DISCHARGE:                              OPERATIVE REPORT   PREOPERATIVE DIAGNOSIS:  Transverse right mid patellar fracture.  POSTOPERATIVE DIAGNOSIS:  Transverse right mid patellar fracture.  PROCEDURE:  Open reduction and internal fixation of right patella.  SURGEON:  Nadara Mustard, MD  ANESTHESIA:  General.  ESTIMATED BLOOD LOSS:  Minimal.  ANTIBIOTICS:  One gram of Kefzol.  DRAINS:  None.  COMPLICATIONS:  None.  TOURNIQUET TIME:  None.  DISPOSITION:  To PACU in stable condition.  INDICATIONS FOR PROCEDURE:  The patient is an 77 year old woman who fell at home.  She lives alone, sustaining a midshaft patella fracture.  The patient was initially brought to [facility] and then was brought to [facility] for definitive treatment.  Risks and benefits with surgery were discussed including infection, neurovascular injury, persistent pain, arthritis, and need for additional surgery.  The patient states she understands and wished to proceed at this time.  DESCRIPTION OF PROCEDURE:  The patient was brought to OR room #15 and underwent general anesthetic.  After adequate level of anesthesia was obtained, the patient's right lower extremity was prepped using DuraPrep, draped into a sterile field and Ioban was used to cover all exposed skin.  A midline incision was made and this was carried down to the patella.  The hemarthrosis was evacuated and irrigated with normal saline.  The edges of the bone were freshened.  A 1.6-mm K-wire was then inserted from the proximal aspect of the distal pole.  From the mid substance exiting distally, the guidewires were then grasped distally. The fracture was reduced and the  guidewires were then driven across, the fracture site exiting the proximal aspect of the proximal pole.  A 20 gauge wire was then looped around the wire in a figure-of-eight fashion. This was secured.  The proximal aspect of the wires was curved and brought distally to capture the proximal aspect of the wire.  They were also then curved distally to minimize risk of the wire pulling out.  The wound was then irrigated with normal saline.  Subcu was closed using a running 0 Vicryl.  Skin was closed with approximating staples.  The wound was covered with a Mepilex dressing, ABD, Kerlix, and Coban.  The patient was placed in a knee immobilizer and taken to PACU in stable condition.  Anticipate short-term skilled nursing placement.     Nadara Mustard, MD     MVD/MEDQ  D:  03/02/2011  T:  03/03/2011  Job:  [id number]  Electronically Signed by Aldean Baker MD on 03/04/2011 09:42:53 AM

## 2011-03-05 LAB — PROTIME-INR
INR: 2.13 — ABNORMAL HIGH (ref 0.00–1.49)
Prothrombin Time: 24.2 seconds — ABNORMAL HIGH (ref 11.6–15.2)

## 2011-03-10 ENCOUNTER — Encounter: Payer: Medicare Other | Admitting: Physical Therapy

## 2011-03-12 ENCOUNTER — Encounter: Payer: Medicare Other | Admitting: Physical Therapy

## 2011-03-17 ENCOUNTER — Encounter: Payer: Medicare Other | Admitting: Physical Therapy

## 2011-03-19 ENCOUNTER — Encounter: Payer: Medicare Other | Admitting: Physical Therapy

## 2011-05-04 ENCOUNTER — Ambulatory Visit: Payer: Medicare Other | Admitting: Physical Therapy

## 2011-06-30 ENCOUNTER — Other Ambulatory Visit (HOSPITAL_BASED_OUTPATIENT_CLINIC_OR_DEPARTMENT_OTHER): Payer: Self-pay | Admitting: Obstetrics and Gynecology

## 2011-06-30 ENCOUNTER — Ambulatory Visit (HOSPITAL_BASED_OUTPATIENT_CLINIC_OR_DEPARTMENT_OTHER)
Admission: RE | Admit: 2011-06-30 | Discharge: 2011-06-30 | Disposition: A | Discharge status: Home / Self Care | Payer: Medicare Other | Source: Ambulatory Visit | Attending: Obstetrics and Gynecology | Admitting: Obstetrics and Gynecology

## 2011-06-30 ENCOUNTER — Inpatient Hospital Stay (HOSPITAL_BASED_OUTPATIENT_CLINIC_OR_DEPARTMENT_OTHER): Admission: RE | Admit: 2011-06-30 | Payer: Medicare Other | Source: Ambulatory Visit

## 2011-06-30 DIAGNOSIS — Z1231 Encounter for screening mammogram for malignant neoplasm of breast: Secondary | ICD-10-CM | POA: Insufficient documentation

## 2011-07-03 ENCOUNTER — Telehealth: Payer: Self-pay | Admitting: Hematology & Oncology

## 2011-07-03 NOTE — Telephone Encounter (Signed)
Pt called made 12-21 appointment from missed 10-11

## 2011-08-14 ENCOUNTER — Other Ambulatory Visit (HOSPITAL_BASED_OUTPATIENT_CLINIC_OR_DEPARTMENT_OTHER): Payer: Medicare Other | Admitting: Lab

## 2011-08-14 ENCOUNTER — Other Ambulatory Visit: Payer: Self-pay | Admitting: Hematology & Oncology

## 2011-08-14 ENCOUNTER — Ambulatory Visit (HOSPITAL_BASED_OUTPATIENT_CLINIC_OR_DEPARTMENT_OTHER): Payer: Medicare Other | Admitting: Hematology & Oncology

## 2011-08-14 VITALS — BP 110/61 | HR 68 | Temp 96.5°F | Ht 62.5 in | Wt 125.0 lb

## 2011-08-14 DIAGNOSIS — D472 Monoclonal gammopathy: Secondary | ICD-10-CM

## 2011-08-14 DIAGNOSIS — M4800 Spinal stenosis, site unspecified: Secondary | ICD-10-CM

## 2011-08-14 DIAGNOSIS — E559 Vitamin D deficiency, unspecified: Secondary | ICD-10-CM

## 2011-08-14 LAB — CBC WITH DIFFERENTIAL (CANCER CENTER ONLY)
BASO%: 0.8 % (ref 0.0–2.0)
EOS%: 2.3 % (ref 0.0–7.0)
LYMPH#: 1.5 10*3/uL (ref 0.9–3.3)
LYMPH%: 20.4 % (ref 14.0–48.0)
MCHC: 33.1 g/dL (ref 32.0–36.0)
MONO#: 0.7 10*3/uL (ref 0.1–0.9)
Platelets: 238 10*3/uL (ref 145–400)
RDW: 13.4 % (ref 11.1–15.7)
WBC: 7.5 10*3/uL (ref 3.9–10.0)

## 2011-08-14 NOTE — Progress Notes (Signed)
This office note has been dictated.

## 2011-08-14 NOTE — Progress Notes (Signed)
CC:   Nolon Nations, MD  DIAGNOSIS:  IgG kappa monoclonal gammopathy of undetermined significance.  CURRENT THERAPY:  Observation.  INTERIM HISTORY:  [name] comes in for followup.  We last saw her in April.  She fell at home and broke her right knee.  She had surgery for this.  She went to rehab for about a month.  She is back home now.  She is doing fairly well.  When we last saw her, her monoclonal studies show that her M spike was 0.38 g/dL.  Her IgG level was 841 mg/dL.  Her serum kappa light chain was 2.72 mg/dL.  These were all relatively stable.  She was doing some physical therapy for her neck and shoulders.  She does have spinal stenosis.  She has also been told that she has polymyalgia rheumatica.  She is eating well.  She is having no problem with bowels or bladder. There have been no rashes.  PHYSICAL EXAM:  General:  This is a well-developed, well-nourished white female in no obvious distress.  Vital Signs:  Temperature 96.5, pulse 68, respiratory rate 20, blood pressure 110/61.  Weight is 125. Head/Neck:  Exam shows a normocephalic, atraumatic skull.  There are no ocular or oral lesions.  There are no palpable cervical or supraclavicular lymph nodes.  Lungs:  Clear to percussion and auscultation bilaterally.  Cardiac:  Regular rate and rhythm with a normal S1, S2.  There are no murmurs, rubs, or bruits.  Abdomen:  Soft with good bowel sounds.  There is no palpable abdominal mass.  There is no fluid wave.  There is no palpable hepatosplenomegaly.  Back:  Exam shows no kyphosis or osteoporotic changes.  There is no tenderness over the spine, ribs, or hips.  Extremities:  No clubbing, cyanosis or edema. Neurologic:  Exam shows no focal neurological deficits.  LABORATORY STUDIES:  White cell count 7.5, hemoglobin 12.3, hematocrit 37.2, platelet count 238.  IMPRESSION:  [name] is a 77 year old white female with a history of IgG kappa monoclonal gammopathy of  undetermined significance.  So far, there has been no evidence of progression of her MGUS.  Of note, she had her mammogram in November.  Everything looks fine on the mammogram.  We will plan to get her back in 6 more months for followup.  I do not see that we need to do any blood work in between visits.   ______________________________ Josph Macho, M.D. PRE/MEDQ  D:  08/14/2011  T:  08/14/2011  Job:  [id number]  ADDENDUM:  M-Spike is 0.44 g/dL.  IgG is 972 mg/dL. KAPPA light is 2.85 mg/dL.

## 2011-08-19 LAB — SPEP & IFE WITH QIG
Alpha-1-Globulin: 5.1 % — ABNORMAL HIGH (ref 2.9–4.9)
Alpha-2-Globulin: 11.2 % (ref 7.1–11.8)
Beta 2: 4.9 % (ref 3.2–6.5)
Beta Globulin: 6.4 % (ref 4.7–7.2)
Gamma Globulin: 14.2 % (ref 11.1–18.8)

## 2011-08-19 LAB — LACTATE DEHYDROGENASE: LDH: 163 U/L (ref 94–250)

## 2011-08-19 LAB — COMPREHENSIVE METABOLIC PANEL
BUN: 27 mg/dL — ABNORMAL HIGH (ref 6–23)
CO2: 26 mEq/L (ref 19–32)
Creatinine, Ser: 0.94 mg/dL (ref 0.50–1.10)
Glucose, Bld: 117 mg/dL — ABNORMAL HIGH (ref 70–99)
Total Bilirubin: 0.3 mg/dL (ref 0.3–1.2)
Total Protein: 6.9 g/dL (ref 6.0–8.3)

## 2011-08-19 LAB — KAPPA/LAMBDA LIGHT CHAINS
Kappa free light chain: 2.85 mg/dL — ABNORMAL HIGH (ref 0.33–1.94)
Kappa:Lambda Ratio: 1.04 (ref 0.26–1.65)
Lambda Free Lght Chn: 2.75 mg/dL — ABNORMAL HIGH (ref 0.57–2.63)

## 2011-08-19 LAB — VITAMIN D 25 HYDROXY (VIT D DEFICIENCY, FRACTURES): Vit D, 25-Hydroxy: 34 ng/mL (ref 30–89)

## 2011-08-24 ENCOUNTER — Encounter (HOSPITAL_BASED_OUTPATIENT_CLINIC_OR_DEPARTMENT_OTHER): Payer: Self-pay | Admitting: Emergency Medicine

## 2011-08-24 ENCOUNTER — Emergency Department (INDEPENDENT_AMBULATORY_CARE_PROVIDER_SITE_OTHER): Payer: Medicare Other

## 2011-08-24 ENCOUNTER — Inpatient Hospital Stay (HOSPITAL_BASED_OUTPATIENT_CLINIC_OR_DEPARTMENT_OTHER)
Admission: EM | Admit: 2011-08-24 | Discharge: 2011-08-30 | DRG: 354 | Disposition: A | Discharge status: Home / Self Care | Payer: Medicare Other | Attending: Surgery | Admitting: Surgery

## 2011-08-24 DIAGNOSIS — K450 Other specified abdominal hernia with obstruction, without gangrene: Principal | ICD-10-CM | POA: Diagnosis present

## 2011-08-24 DIAGNOSIS — I10 Essential (primary) hypertension: Secondary | ICD-10-CM | POA: Diagnosis present

## 2011-08-24 DIAGNOSIS — E7800 Pure hypercholesterolemia, unspecified: Secondary | ICD-10-CM | POA: Diagnosis present

## 2011-08-24 DIAGNOSIS — M1990 Unspecified osteoarthritis, unspecified site: Secondary | ICD-10-CM

## 2011-08-24 DIAGNOSIS — K560 Paralytic ileus: Secondary | ICD-10-CM | POA: Diagnosis not present

## 2011-08-24 DIAGNOSIS — Z8673 Personal history of transient ischemic attack (TIA), and cerebral infarction without residual deficits: Secondary | ICD-10-CM

## 2011-08-24 DIAGNOSIS — K4130 Unilateral femoral hernia, with obstruction, without gangrene, not specified as recurrent: Secondary | ICD-10-CM

## 2011-08-24 DIAGNOSIS — D472 Monoclonal gammopathy: Secondary | ICD-10-CM | POA: Diagnosis present

## 2011-08-24 DIAGNOSIS — K56609 Unspecified intestinal obstruction, unspecified as to partial versus complete obstruction: Secondary | ICD-10-CM

## 2011-08-24 DIAGNOSIS — I252 Old myocardial infarction: Secondary | ICD-10-CM

## 2011-08-24 DIAGNOSIS — I351 Nonrheumatic aortic (valve) insufficiency: Secondary | ICD-10-CM

## 2011-08-24 LAB — URINE MICROSCOPIC-ADD ON

## 2011-08-24 LAB — DIFFERENTIAL
Basophils Absolute: 0 10*3/uL (ref 0.0–0.1)
Lymphocytes Relative: 15 % (ref 12–46)
Lymphs Abs: 1.4 10*3/uL (ref 0.7–4.0)
Monocytes Absolute: 0.7 10*3/uL (ref 0.1–1.0)
Neutro Abs: 7.2 10*3/uL (ref 1.7–7.7)

## 2011-08-24 LAB — CBC
Hemoglobin: 12.9 g/dL (ref 12.0–15.0)
MCH: 32.2 pg (ref 26.0–34.0)
MCHC: 33.3 g/dL (ref 30.0–36.0)
Platelets: 196 10*3/uL (ref 150–400)

## 2011-08-24 LAB — URINALYSIS, ROUTINE W REFLEX MICROSCOPIC
Glucose, UA: NEGATIVE mg/dL
Hgb urine dipstick: NEGATIVE
Specific Gravity, Urine: 1.018 (ref 1.005–1.030)
pH: 7.5 (ref 5.0–8.0)

## 2011-08-24 MED ORDER — FENTANYL CITRATE 0.05 MG/ML IJ SOLN
50.0000 ug | Freq: Once | INTRAMUSCULAR | Status: AC
  Administered 2011-08-25: 50 ug via INTRAVENOUS
  Filled 2011-08-24: qty 2

## 2011-08-24 MED ORDER — SODIUM CHLORIDE 0.9 % IV SOLN
Freq: Once | INTRAVENOUS | Status: AC
  Administered 2011-08-25: via INTRAVENOUS

## 2011-08-24 MED ORDER — ONDANSETRON HCL 4 MG/2ML IJ SOLN
4.0000 mg | Freq: Once | INTRAMUSCULAR | Status: AC
  Administered 2011-08-24: 4 mg via INTRAVENOUS
  Filled 2011-08-24: qty 2

## 2011-08-24 NOTE — ED Notes (Signed)
Pt c/o abd pain & nausea since 1700; has been able to eat, but pain is worse now

## 2011-08-24 NOTE — ED Provider Notes (Addendum)
History    Scribed for Hanley Seamen, MD, the patient was seen in room MH08/MH08. This chart was scribed by Katha Cabal.   CSN: [id number]  Arrival date & time 08/24/11  2216   None     Chief Complaint  Patient presents with  . Abdominal Pain    (Consider location/radiation/quality/duration/timing/severity/associated sxs/prior treatment) Patient is a 77 y.o. female presenting with abdominal pain. The history is provided by the patient. No language interpreter was used.  Abdominal Pain The primary symptoms of the illness include abdominal pain and nausea. The primary symptoms of the illness do not include fever, vomiting, diarrhea or dysuria. Episode onset: 5 PM. The problem has been gradually worsening.  The abdominal pain is located in the LLQ and LUQ. The abdominal pain radiates to the suprapubic region.  The patient states that she believes she is currently not pregnant. The patient has not had a change in bowel habit. Risk factors for an acute abdominal problem include being elderly. Symptoms associated with the illness do not include chills.   Patient reports never having pain before.  Accompanied to the ED by daughter.  Patient reports sharp pains suddenly across lower abdomen.  Patient was able to eat dinner around 8 PM.  Patient has not vomited.  Pain is not relieved by BM.  No burning with urination.  Patient reports difficulty urinating and left lower chest pain that may be referred.  Patient denies cough, straining, fever or chills.      Past Medical History  Diagnosis Date  . Hypertension   . High cholesterol     Past Surgical History  Procedure Date  . Abdominal hysterectomy     No family history on file.  History  Substance Use Topics  . Smoking status: Never Smoker   . Smokeless tobacco: Not on file  . Alcohol Use: Yes     very rarely    OB History    Grav Para Term Preterm Abortions TAB SAB Ect Mult Living                  Review of Systems    Constitutional: Negative for fever and chills.  Respiratory: Negative for cough.   Gastrointestinal: Positive for nausea and abdominal pain. Negative for vomiting and diarrhea.  Genitourinary: Positive for difficulty urinating. Negative for dysuria.  All other systems reviewed and are negative.    Allergies  Zyrtec  Home Medications   Current Outpatient Rx  Name Route Sig Dispense Refill  . ACTONEL 150 MG PO TABS Oral Take 150 mg by mouth every 30 (thirty) days.     . ASPIRIN 500 MG PO TBEC Oral Take 1,000 mg by mouth at bedtime.     Marland Kitchen CALCIUM CITRATE-VITAMIN D 315-200 MG-UNIT PO TABS Oral Take 1 tablet by mouth daily.     Marland Kitchen VITAMIN D 2000 UNITS PO CAPS Oral Take 2,000 Units by mouth daily.      . FUROSEMIDE 20 MG PO TABS Oral Take 20 mg by mouth daily.      Marland Kitchen GABAPENTIN 300 MG PO CAPS Oral Take 600 mg by mouth at bedtime.      Marland Kitchen LISINOPRIL 20 MG PO TABS Oral Take 20 mg by mouth daily.      Marland Kitchen POLYETHYL GLYCOL-PROPYL GLYCOL 0.4-0.3 % OP SOLN Both Eyes Place 1 drop into both eyes 2 (two) times daily.     Marland Kitchen POLYETHYLENE GLYCOL 3350 PO PACK Oral Take 17 g by mouth daily.      Marland Kitchen  SIMVASTATIN 40 MG PO TABS Oral Take 40 mg by mouth at bedtime.        BP 191/88  Pulse 72  Temp(Src) 97.9 F (36.6 C) (Oral)  Resp 18  SpO2 100%  Physical Exam General: Well-developed, well-nourished female in no acute distress; appearance much younger than her years HENT: normocephalic, atraumatic Eyes: pupils equal round and reactive to light; extraocular muscles intact, lens implants from cataract surgery, right conjunctival hemorrhage  Neck: supple Heart: regular rate and rhythm; no murmurs, rubs or gallops Lungs: clear to auscultation bilaterally Abdomen: soft; nontender; nondistended; no masses or hepatosplenomegaly; bowel sounds present, tenderness prominent in LLQ and tenderness in LUQ and suprapubic region Back: kyphosis that is age appropriate  Extremities: No deformity; full range of motion;  pulses normal, trace edema, well healing surgical wound of right knee Neurologic: Awake, alert and oriented;motor function intact in all extremities and symmetric; no facial droop Skin: Warm and dry Psychiatric: Normal mood and affect   ED Course  Hernia reduction Date/Time: 08/25/2011 2:25 AM Performed by: Hanley Seamen Authorized by: Hanley Seamen Consent: Verbal consent obtained. Written consent obtained. Risks and benefits: risks, benefits and alternatives were discussed Consent given by: patient Patient identity confirmed: verbally with patient and arm band Time out: Immediately prior to procedure a "time out" was called to verify the correct patient, procedure, equipment, support staff and site/side marked as required. Patient sedated: yes Sedation type: moderate (conscious) sedation Sedatives: etomidate Vitals: Vital signs were monitored during sedation. Patient tolerance: Patient tolerated the procedure well with no immediate complications. Comments: Unsuccessful attempt to reduce left femoral hernia. Patient did experience emesis and was suctioned and rolled to her side to prevent aspiration. Patient is now back to her presedation baseline.   (including critical care time)   DIAGNOSTIC STUDIES: Oxygen Saturation is 100% on room air, normal by my interpretation.     COORDINATION OF CARE: 10:54 PM   Physical exam complete.         MDM   Nursing notes and vitals signs, including pulse oximetry, reviewed.  Summary of this visit's results, reviewed by myself:  Labs:  Results for orders placed during the hospital encounter of 08/24/11  CBC      Component Value Range   WBC 9.4  4.0 - 10.5 (K/uL)   RBC 4.01  3.87 - 5.11 (MIL/uL)   Hemoglobin 12.9  12.0 - 15.0 (g/dL)   HCT 16.1  09.6 - 04.5 (%)   MCV 96.5  78.0 - 100.0 (fL)   MCH 32.2  26.0 - 34.0 (pg)   MCHC 33.3  30.0 - 36.0 (g/dL)   RDW 40.9  81.1 - 91.4 (%)   Platelets 196  150 - 400 (K/uL)  DIFFERENTIAL        Component Value Range   Neutrophils Relative 77  43 - 77 (%)   Neutro Abs 7.2  1.7 - 7.7 (K/uL)   Lymphocytes Relative 15  12 - 46 (%)   Lymphs Abs 1.4  0.7 - 4.0 (K/uL)   Monocytes Relative 7  3 - 12 (%)   Monocytes Absolute 0.7  0.1 - 1.0 (K/uL)   Eosinophils Relative 1  0 - 5 (%)   Eosinophils Absolute 0.1  0.0 - 0.7 (K/uL)   Basophils Relative 0  0 - 1 (%)   Basophils Absolute 0.0  0.0 - 0.1 (K/uL)  BASIC METABOLIC PANEL      Component Value Range   Sodium 138  135 -  145 (mEq/L)   Potassium 4.2  3.5 - 5.1 (mEq/L)   Chloride 99  96 - 112 (mEq/L)   CO2 28  19 - 32 (mEq/L)   Glucose, Bld 121 (*) 70 - 99 (mg/dL)   BUN 27 (*) 6 - 23 (mg/dL)   Creatinine, Ser 1.61  0.50 - 1.10 (mg/dL)   Calcium 9.8  8.4 - 09.6 (mg/dL)   GFR calc non Af Amer 80 (*) >90 (mL/min)   GFR calc Af Amer >90  >90 (mL/min)  URINALYSIS, ROUTINE W REFLEX MICROSCOPIC      Component Value Range   Color, Urine YELLOW  YELLOW    APPearance CLEAR  CLEAR    Specific Gravity, Urine 1.018  1.005 - 1.030    pH 7.5  5.0 - 8.0    Glucose, UA NEGATIVE  NEGATIVE (mg/dL)   Hgb urine dipstick NEGATIVE  NEGATIVE    Bilirubin Urine NEGATIVE  NEGATIVE    Ketones, ur NEGATIVE  NEGATIVE (mg/dL)   Protein, ur NEGATIVE  NEGATIVE (mg/dL)   Urobilinogen, UA 0.2  0.0 - 1.0 (mg/dL)   Nitrite NEGATIVE  NEGATIVE    Leukocytes, UA SMALL (*) NEGATIVE   URINE MICROSCOPIC-ADD ON      Component Value Range   Squamous Epithelial / LPF FEW (*) RARE    WBC, UA 3-6  <3 (WBC/hpf)   RBC / HPF 0-2  <3 (RBC/hpf)   Casts GRANULAR CAST (*) NEGATIVE    Urine-Other AMORPHOUS URATES/PHOSPHATES     I personally performed the services described in this documentation, which was scribed in my presence.  The recorded information has been reviewed and considered.  Ct Abdomen Pelvis W Contrast  08/25/2011  *RADIOLOGY REPORT*  Clinical Data: Left lower quadrant pain.  Nausea and vomiting.  CT ABDOMEN AND PELVIS WITH CONTRAST  Technique:   Multidetector CT imaging of the abdomen and pelvis was performed following the standard protocol during bolus administration of intravenous contrast.  Contrast: OMNIPAQUE IOHEXOL 300 MG/ML IV SOLN, 20mL OMNIPAQUE IOHEXOL 300 MG/ML IV SOLN  Comparison: 04/24/2008  Findings: The patient has a left inguinal or femoral hernia which is causing a small bowel obstruction.  This is best seen on images number 63 through 67 of series 2.  The liver, spleen, adrenal glands, and kidneys are normal except for a simple appearing 2 cm cyst on the inferior aspect of the midportion of the right kidney.  There is diffuse pancreatic atrophy.  The appendix and terminal ileum are normal.  There are diverticula in the distal colon but there is no diverticulitis.  IMPRESSION: Incarcerated left femoral hernia causing small bowel obstruction.  Original Report Authenticated By: Gwynn Burly, M.D.   Dg Chest Portable 1 View  08/25/2011  *RADIOLOGY REPORT*  Clinical Data: Preop bowel obstruction.  Abdominal pain.  PORTABLE CHEST - 1 VIEW  Comparison: 03/01/2011  Findings: Shallow inspiration.  Heart size and pulmonary vascularity are normal.  Linear fibrosis in the left lung base.  No focal airspace consolidation.  No blunting of costophrenic angles. No pneumothorax.  Tortuous aorta.  Degenerative changes and curvature of the spine.  No significant change since previous study.  IMPRESSION: No evidence of active pulmonary disease.  Original Report Authenticated By: Marlon Pel, M.D.    2:33 AM Nursing staff unable to place nasogastric tube despite several attempts. We'll transfer patient to [facility] ED for where Dr. Biagio Quint will evaluate her.  EKG Interpretation:  Date & Time: 08/25/2011 2:49 AM  Rate: 71  Rhythm: normal sinus rhythm  QRS Axis: normal  Intervals: normal  ST/T Wave abnormalities: normal  Conduction Disutrbances:none  Narrative Interpretation:   Old EKG Reviewed: none available     Hanley Seamen, MD 08/25/11 0234  Hanley Seamen, MD 08/25/11 8469  Hanley Seamen, MD 08/25/11 0345

## 2011-08-25 ENCOUNTER — Encounter (HOSPITAL_COMMUNITY): Payer: Self-pay | Admitting: Anesthesiology

## 2011-08-25 ENCOUNTER — Encounter (HOSPITAL_COMMUNITY): Payer: Self-pay | Admitting: Surgery

## 2011-08-25 ENCOUNTER — Emergency Department (HOSPITAL_COMMUNITY): Payer: Medicare Other | Admitting: Anesthesiology

## 2011-08-25 ENCOUNTER — Encounter (HOSPITAL_COMMUNITY): Admission: EM | Disposition: A | Discharge status: Home / Self Care | Payer: Self-pay | Source: Home / Self Care

## 2011-08-25 ENCOUNTER — Other Ambulatory Visit: Payer: Self-pay

## 2011-08-25 ENCOUNTER — Emergency Department (INDEPENDENT_AMBULATORY_CARE_PROVIDER_SITE_OTHER): Payer: Medicare Other

## 2011-08-25 DIAGNOSIS — R112 Nausea with vomiting, unspecified: Secondary | ICD-10-CM

## 2011-08-25 DIAGNOSIS — K56609 Unspecified intestinal obstruction, unspecified as to partial versus complete obstruction: Secondary | ICD-10-CM

## 2011-08-25 DIAGNOSIS — R1032 Left lower quadrant pain: Secondary | ICD-10-CM

## 2011-08-25 DIAGNOSIS — Z01818 Encounter for other preprocedural examination: Secondary | ICD-10-CM

## 2011-08-25 DIAGNOSIS — K450 Other specified abdominal hernia with obstruction, without gangrene: Secondary | ICD-10-CM

## 2011-08-25 DIAGNOSIS — R109 Unspecified abdominal pain: Secondary | ICD-10-CM

## 2011-08-25 DIAGNOSIS — IMO0001 Reserved for inherently not codable concepts without codable children: Secondary | ICD-10-CM

## 2011-08-25 HISTORY — PX: LAPAROSCOPY: SHX197

## 2011-08-25 LAB — BASIC METABOLIC PANEL
CO2: 28 mEq/L (ref 19–32)
Chloride: 99 mEq/L (ref 96–112)
Glucose, Bld: 121 mg/dL — ABNORMAL HIGH (ref 70–99)
Sodium: 138 mEq/L (ref 135–145)

## 2011-08-25 SURGERY — LAPAROSCOPY, DIAGNOSTIC
Anesthesia: General | Site: Abdomen | Wound class: Clean

## 2011-08-25 MED ORDER — LIDOCAINE-EPINEPHRINE 1 %-1:100000 IJ SOLN
INTRAMUSCULAR | Status: DC | PRN
  Administered 2011-08-25: 15 mL

## 2011-08-25 MED ORDER — LACTATED RINGERS IR SOLN
Status: DC | PRN
  Administered 2011-08-25: 1000 mL

## 2011-08-25 MED ORDER — CEFAZOLIN SODIUM 1-5 GM-% IV SOLN
1.0000 g | Freq: Once | INTRAVENOUS | Status: AC
  Administered 2011-08-25: 1 g via INTRAVENOUS
  Filled 2011-08-25: qty 50

## 2011-08-25 MED ORDER — BUPIVACAINE HCL (PF) 0.25 % IJ SOLN
INTRAMUSCULAR | Status: DC | PRN
  Administered 2011-08-25: 15 mL

## 2011-08-25 MED ORDER — LACTATED RINGERS IV SOLN: INTRAVENOUS | Status: DC

## 2011-08-25 MED ORDER — PROPOFOL 10 MG/ML IV BOLUS
INTRAVENOUS | Status: DC | PRN
  Administered 2011-08-25: 160 mg via INTRAVENOUS

## 2011-08-25 MED ORDER — NEOSTIGMINE METHYLSULFATE 1 MG/ML IJ SOLN
INTRAMUSCULAR | Status: DC | PRN
  Administered 2011-08-25: 4 mg via INTRAVENOUS

## 2011-08-25 MED ORDER — ENOXAPARIN SODIUM 40 MG/0.4ML ~~LOC~~ SOLN
40.0000 mg | SUBCUTANEOUS | Status: DC
  Administered 2011-08-26 – 2011-08-30 (×5): 40 mg via SUBCUTANEOUS
  Filled 2011-08-25 (×5): qty 0.4

## 2011-08-25 MED ORDER — IOHEXOL 300 MG/ML  SOLN
20.0000 mL | INTRAMUSCULAR | Status: AC
  Administered 2011-08-24: 20 mL via ORAL

## 2011-08-25 MED ORDER — FENTANYL CITRATE 0.05 MG/ML IJ SOLN
50.0000 ug | Freq: Once | INTRAMUSCULAR | Status: AC
  Administered 2011-08-25: 50 ug via INTRAVENOUS
  Filled 2011-08-25: qty 2

## 2011-08-25 MED ORDER — IOHEXOL 300 MG/ML  SOLN
100.0000 mL | Freq: Once | INTRAMUSCULAR | Status: AC | PRN
  Administered 2011-08-25: 100 mL via INTRAVENOUS

## 2011-08-25 MED ORDER — FENTANYL CITRATE 0.05 MG/ML IJ SOLN: 25.0000 ug | INTRAMUSCULAR | Status: DC | PRN

## 2011-08-25 MED ORDER — HYDROMORPHONE HCL PF 1 MG/ML IJ SOLN
INTRAMUSCULAR | Status: AC
  Administered 2011-08-25: 0.5 mg
  Administered 2011-08-25: 0.25 mg
  Filled 2011-08-25: qty 1

## 2011-08-25 MED ORDER — LIDOCAINE-EPINEPHRINE 1 %-1:100000 IJ SOLN
INTRAMUSCULAR | Status: AC
  Filled 2011-08-25: qty 1

## 2011-08-25 MED ORDER — SUCCINYLCHOLINE CHLORIDE 20 MG/ML IJ SOLN
INTRAMUSCULAR | Status: DC | PRN
  Administered 2011-08-25: 100 mg via INTRAVENOUS

## 2011-08-25 MED ORDER — PROMETHAZINE HCL 25 MG/ML IJ SOLN: 6.2500 mg | INTRAMUSCULAR | Status: DC | PRN

## 2011-08-25 MED ORDER — GLYCOPYRROLATE 0.2 MG/ML IJ SOLN
INTRAMUSCULAR | Status: DC | PRN
  Administered 2011-08-25: .6 mg via INTRAVENOUS

## 2011-08-25 MED ORDER — METOCLOPRAMIDE HCL 5 MG/ML IJ SOLN
5.0000 mg | Freq: Once | INTRAMUSCULAR | Status: AC
  Administered 2011-08-25: 5 mg via INTRAVENOUS
  Filled 2011-08-25: qty 2

## 2011-08-25 MED ORDER — ONDANSETRON HCL 4 MG/2ML IJ SOLN
INTRAMUSCULAR | Status: DC | PRN
  Administered 2011-08-25: 4 mg via INTRAVENOUS

## 2011-08-25 MED ORDER — LACTATED RINGERS IV SOLN
INTRAVENOUS | Status: DC
  Administered 2011-08-25: 09:00:00 via INTRAVENOUS
  Administered 2011-08-25: 1000 mL via INTRAVENOUS

## 2011-08-25 MED ORDER — CEFAZOLIN SODIUM 1-5 GM-% IV SOLN
INTRAVENOUS | Status: AC
  Filled 2011-08-25: qty 50

## 2011-08-25 MED ORDER — FENTANYL CITRATE 0.05 MG/ML IJ SOLN
INTRAMUSCULAR | Status: DC | PRN
  Administered 2011-08-25: 100 ug via INTRAVENOUS
  Administered 2011-08-25 (×3): 50 ug via INTRAVENOUS

## 2011-08-25 MED ORDER — LIDOCAINE HCL (CARDIAC) 20 MG/ML IV SOLN
INTRAVENOUS | Status: DC | PRN
  Administered 2011-08-25: 80 mg via INTRAVENOUS

## 2011-08-25 MED ORDER — ONDANSETRON HCL 4 MG/2ML IJ SOLN: 4.0000 mg | Freq: Four times a day (QID) | INTRAMUSCULAR | Status: DC | PRN

## 2011-08-25 MED ORDER — BUPIVACAINE HCL (PF) 0.25 % IJ SOLN
INTRAMUSCULAR | Status: AC
  Filled 2011-08-25: qty 30

## 2011-08-25 MED ORDER — ETOMIDATE 2 MG/ML IV SOLN
0.3000 mg/kg | Freq: Once | INTRAVENOUS | Status: AC
  Administered 2011-08-25: 8.5 mg via INTRAVENOUS

## 2011-08-25 MED ORDER — MORPHINE SULFATE 2 MG/ML IJ SOLN
1.0000 mg | INTRAMUSCULAR | Status: DC | PRN
  Administered 2011-08-25 – 2011-08-28 (×7): 2 mg via INTRAVENOUS
  Filled 2011-08-25 (×7): qty 1

## 2011-08-25 MED ORDER — ONDANSETRON HCL 4 MG/2ML IJ SOLN
INTRAMUSCULAR | Status: AC
  Administered 2011-08-25: 4 mg
  Filled 2011-08-25: qty 2

## 2011-08-25 MED ORDER — ROCURONIUM BROMIDE 100 MG/10ML IV SOLN
INTRAVENOUS | Status: DC | PRN
  Administered 2011-08-25: 20 mg via INTRAVENOUS
  Administered 2011-08-25: 5 mg via INTRAVENOUS

## 2011-08-25 MED ORDER — 0.9 % SODIUM CHLORIDE (POUR BTL) OPTIME
TOPICAL | Status: DC | PRN
  Administered 2011-08-25: 1000 mL

## 2011-08-25 MED ORDER — ETOMIDATE 2 MG/ML IV SOLN
INTRAVENOUS | Status: AC
  Administered 2011-08-25: 8.5 mg via INTRAVENOUS
  Filled 2011-08-25: qty 10

## 2011-08-25 MED ORDER — KCL IN DEXTROSE-NACL 20-5-0.45 MEQ/L-%-% IV SOLN
INTRAVENOUS | Status: DC
  Administered 2011-08-25 – 2011-08-28 (×6): via INTRAVENOUS
  Filled 2011-08-25 (×15): qty 1000

## 2011-08-25 MED ORDER — EPHEDRINE SULFATE 50 MG/ML IJ SOLN
INTRAMUSCULAR | Status: DC | PRN
  Administered 2011-08-25: 10 mg via INTRAVENOUS
  Administered 2011-08-25: 5 mg via INTRAVENOUS

## 2011-08-25 SURGICAL SUPPLY — 86 items
ADH SKN CLS APL DERMABOND .7 (GAUZE/BANDAGES/DRESSINGS)
APL SKNCLS STERI-STRIP NONHPOA (GAUZE/BANDAGES/DRESSINGS) ×2
APPLIER CLIP 5 13 M/L LIGAMAX5 (MISCELLANEOUS)
APPLIER CLIP ROT 10 11.4 M/L (STAPLE)
APR CLP MED LRG 11.4X10 (STAPLE)
APR CLP MED LRG 5 ANG JAW (MISCELLANEOUS)
BENZOIN TINCTURE PRP APPL 2/3 (GAUZE/BANDAGES/DRESSINGS) ×3 IMPLANT
BLADE EXTENDED COATED 6.5IN (ELECTRODE) IMPLANT
BLADE HEX COATED 2.75 (ELECTRODE) IMPLANT
BLADE SURG SZ10 CARB STEEL (BLADE) IMPLANT
CABLE HI FREQUENCY MONOPOLAR (ELECTROSURGICAL) ×2 IMPLANT
CANISTER SUCTION 2500CC (MISCELLANEOUS) ×3 IMPLANT
CANNULA ENDOPATH XCEL 11M (ENDOMECHANICALS) IMPLANT
CLIP APPLIE 5 13 M/L LIGAMAX5 (MISCELLANEOUS) IMPLANT
CLIP APPLIE ROT 10 11.4 M/L (STAPLE) IMPLANT
CLOTH BEACON ORANGE TIMEOUT ST (SAFETY) ×3 IMPLANT
COVER MAYO STAND STRL (DRAPES) ×1 IMPLANT
DECANTER SPIKE VIAL GLASS SM (MISCELLANEOUS) ×3 IMPLANT
DERMABOND ADVANCED (GAUZE/BANDAGES/DRESSINGS)
DERMABOND ADVANCED .7 DNX12 (GAUZE/BANDAGES/DRESSINGS) IMPLANT
DEVICE TROCAR PUNCTURE CLOSURE (ENDOMECHANICALS) ×2 IMPLANT
DRAIN PENROSE 18X1/2 LTX STRL (DRAIN) IMPLANT
DRAPE LAPAROSCOPIC ABDOMINAL (DRAPES) ×3 IMPLANT
DRAPE LAPAROTOMY TRNSV 102X78 (DRAPE) ×1 IMPLANT
DRAPE UTILITY XL STRL (DRAPES) ×3 IMPLANT
DRAPE WARM FLUID 44X44 (DRAPE) IMPLANT
DRSG TEGADERM 4X4.75 (GAUZE/BANDAGES/DRESSINGS) ×3 IMPLANT
ELECT CAUTERY BLADE 6.4 (BLADE) ×1 IMPLANT
ELECT REM PT RETURN 9FT ADLT (ELECTROSURGICAL) ×3
ELECTRODE REM PT RTRN 9FT ADLT (ELECTROSURGICAL) ×2 IMPLANT
FILTER SMOKE EVAC LAPAROSHD (FILTER) IMPLANT
GLOVE BIO SURGEON STRL SZ7 (GLOVE) ×3 IMPLANT
GLOVE BIO SURGEON STRL SZ7.5 (GLOVE) ×3 IMPLANT
GLOVE BIOGEL M 7.0 STRL (GLOVE) ×3 IMPLANT
GLOVE BIOGEL PI IND STRL 7.0 (GLOVE) ×2 IMPLANT
GLOVE BIOGEL PI INDICATOR 7.0 (GLOVE) ×1
GLOVE SURG SS PI 7.5 STRL IVOR (GLOVE) ×6 IMPLANT
GOWN PREVENTION PLUS LG XLONG (DISPOSABLE) ×3 IMPLANT
GOWN STRL NON-REIN LRG LVL3 (GOWN DISPOSABLE) ×3 IMPLANT
GOWN STRL REIN XL XLG (GOWN DISPOSABLE) ×6 IMPLANT
KIT BASIN OR (CUSTOM PROCEDURE TRAY) ×3 IMPLANT
LIGASURE IMPACT 36 18CM CVD LR (INSTRUMENTS) IMPLANT
NEEDLE HYPO 22GX1.5 SAFETY (NEEDLE) ×1 IMPLANT
NS IRRIG 1000ML POUR BTL (IV SOLUTION) ×3 IMPLANT
PACK GENERAL/GYN (CUSTOM PROCEDURE TRAY) ×3 IMPLANT
PENCIL BUTTON HOLSTER BLD 10FT (ELECTRODE) ×2 IMPLANT
SCALPEL HARMONIC ACE (MISCELLANEOUS) IMPLANT
SCISSORS LAP 5X35 DISP (ENDOMECHANICALS) ×2 IMPLANT
SET IRRIG TUBING LAPAROSCOPIC (IRRIGATION / IRRIGATOR) ×4 IMPLANT
SOLUTION ANTI FOG 6CC (MISCELLANEOUS) ×3 IMPLANT
SPONGE GAUZE 4X4 12PLY (GAUZE/BANDAGES/DRESSINGS) ×1 IMPLANT
SPONGE LAP 18X18 X RAY DECT (DISPOSABLE) IMPLANT
STAPLER VISISTAT 35W (STAPLE) IMPLANT
STRIP CLOSURE SKIN 1/2X4 (GAUZE/BANDAGES/DRESSINGS) ×1 IMPLANT
SUCTION POOLE TIP (SUCTIONS) IMPLANT
SUT MNCRL AB 4-0 PS2 18 (SUTURE) ×5 IMPLANT
SUT PDS AB 1 TP1 96 (SUTURE) IMPLANT
SUT PROLENE 2 0 CT2 30 (SUTURE) IMPLANT
SUT PROLENE 2 0 KS (SUTURE) IMPLANT
SUT PROLENE 2 0 SH DA (SUTURE) IMPLANT
SUT SILK 2 0 (SUTURE)
SUT SILK 2 0 SH (SUTURE) ×6 IMPLANT
SUT SILK 2 0 SH CR/8 (SUTURE) IMPLANT
SUT SILK 2-0 18XBRD TIE 12 (SUTURE) IMPLANT
SUT SILK 3 0 (SUTURE)
SUT SILK 3 0 SH CR/8 (SUTURE) IMPLANT
SUT SILK 3-0 18XBRD TIE 12 (SUTURE) IMPLANT
SUT VIC AB 2-0 SH 27 (SUTURE)
SUT VIC AB 2-0 SH 27X BRD (SUTURE) ×1 IMPLANT
SUT VIC AB 3-0 SH 18 (SUTURE) ×1 IMPLANT
SUT VICRYL 0 ENDOLOOP (SUTURE) ×2 IMPLANT
SUT VICRYL 0 UR6 27IN ABS (SUTURE) ×2 IMPLANT
SYR CONTROL 10ML LL (SYRINGE) ×1 IMPLANT
SYS LAPSCP GELPORT 120MM (MISCELLANEOUS)
SYSTEM LAPSCP GELPORT 120MM (MISCELLANEOUS) IMPLANT
TOWEL OR 17X26 10 PK STRL BLUE (TOWEL DISPOSABLE) ×3 IMPLANT
TRAY FOLEY CATH 14FRSI W/METER (CATHETERS) ×3 IMPLANT
TRAY LAP CHOLE (CUSTOM PROCEDURE TRAY) ×3 IMPLANT
TROCAR BALLN 12MMX100 BLUNT (TROCAR) ×2 IMPLANT
TROCAR BLADELESS OPT 5 75 (ENDOMECHANICALS) ×7 IMPLANT
TROCAR XCEL 12X100 BLDLESS (ENDOMECHANICALS) IMPLANT
TROCAR XCEL BLUNT TIP 100MML (ENDOMECHANICALS) IMPLANT
TROCAR XCEL NON-BLD 11X100MML (ENDOMECHANICALS) IMPLANT
TUBING INSUFFLATION 10FT LAP (TUBING) ×3 IMPLANT
YANKAUER SUCT BULB TIP 10FT TU (MISCELLANEOUS) IMPLANT
YANKAUER SUCT BULB TIP NO VENT (SUCTIONS) IMPLANT

## 2011-08-25 NOTE — Transfer of Care (Signed)
Immediate Anesthesia Transfer of Care Note  Patient: [name]  Procedure(s) Performed:  LAPAROSCOPY DIAGNOSTIC - reduction obturator hernia obturator hernia repair  Patient Location: PACU  Anesthesia Type: General  Level of Consciousness: awake, alert  and oriented  Airway & Oxygen Therapy: Patient Spontanous Breathing and Patient connected to face mask oxygen  Post-op Assessment: Report given to PACU RN and Post -op Vital signs reviewed and stable  Post vital signs: Reviewed and stable  Complications: No apparent anesthesia complications

## 2011-08-25 NOTE — ED Notes (Signed)
EAV:[id number]> Expected date:<BR> Expected time:<BR> Means of arrival:<BR> Comments:<BR> [facility] [place]surgery-Dr. Biagio Quint

## 2011-08-25 NOTE — ED Notes (Signed)
PT vomitted about 100cc with food particles

## 2011-08-25 NOTE — H&P (Signed)
Reason for Consult:left femoral hernia, bowel obstruction Referring Physician: [place]  [name] is an 78 y.o. female.  HPI: this [place] of a bowel obstruction but because by an incarcerated left femoral hernia is found on CT scan. She was in her usual state of health until this afternoon at about 4:00 she began having some left-sided abdominal pain which radiated across her abdomen and worsened throughout the night. She describes as "cramping" and had associated nausea and several episodes of vomiting. She has some mild distention but otherwise her abdomen is normal except for the pain. She denies any fevers or chills and had a normal colonoscopy 2 weeks ago except for some diverticulosis. She states that her bowels have been normal otherwise. She has denied any prior bulge in her groin area. She denied any knowledge of any prior hernia.  Past Medical History  Diagnosis Date  . Hypertension   . High cholesterol   leaky heart valves x3 followed by Dr. Harriette Bouillon  Past Surgical History  Procedure Date  . Abdominal hysterectomy     No family history on file.  Social History:  reports that she has never smoked. She does not have any smokeless tobacco history on file. She reports that she drinks alcohol. She reports that she does not use illicit drugs.  Allergies:  Allergies  Allergen Reactions  . Zyrtec (Cetirizine Hcl) Hives    Medications: I have reviewed the patient's current medications.  Results for orders placed during the hospital encounter of 08/24/11 (from the past 48 hour(s))  URINALYSIS, ROUTINE W REFLEX MICROSCOPIC     Status: Abnormal   Collection Time   08/24/11 11:19 PM      Component Value Range Comment   Color, Urine YELLOW  YELLOW     APPearance CLEAR  CLEAR     Specific Gravity, Urine 1.018  1.005 - 1.030     pH 7.5  5.0 - 8.0     Glucose, UA NEGATIVE  NEGATIVE (mg/dL)    Hgb urine dipstick NEGATIVE   NEGATIVE     Bilirubin Urine NEGATIVE  NEGATIVE     Ketones, ur NEGATIVE  NEGATIVE (mg/dL)    Protein, ur NEGATIVE  NEGATIVE (mg/dL)    Urobilinogen, UA 0.2  0.0 - 1.0 (mg/dL)    Nitrite NEGATIVE  NEGATIVE     Leukocytes, UA SMALL (*) NEGATIVE    URINE MICROSCOPIC-ADD ON     Status: Abnormal   Collection Time   08/24/11 11:19 PM      Component Value Range Comment   Squamous Epithelial / LPF FEW (*) RARE     WBC, UA 3-6  <3 (WBC/hpf)    RBC / HPF 0-2  <3 (RBC/hpf)    Casts GRANULAR CAST (*) NEGATIVE  HYALINE CASTS   Urine-Other AMORPHOUS URATES/PHOSPHATES     CBC     Status: Normal   Collection Time   08/24/11 11:35 PM      Component Value Range Comment   WBC 9.4  4.0 - 10.5 (K/uL)    RBC 4.01  3.87 - 5.11 (MIL/uL)    Hemoglobin 12.9  12.0 - 15.0 (g/dL)    HCT 09.8  11.9 - 14.7 (%)    MCV 96.5  78.0 - 100.0 (fL)    MCH 32.2  26.0 - 34.0 (pg)    MCHC 33.3  30.0 - 36.0 (g/dL)    RDW 82.9  56.2 - 13.0 (%)  Platelets 196  150 - 400 (K/uL)   DIFFERENTIAL     Status: Normal   Collection Time   08/24/11 11:35 PM      Component Value Range Comment   Neutrophils Relative 77  43 - 77 (%)    Neutro Abs 7.2  1.7 - 7.7 (K/uL)    Lymphocytes Relative 15  12 - 46 (%)    Lymphs Abs 1.4  0.7 - 4.0 (K/uL)    Monocytes Relative 7  3 - 12 (%)    Monocytes Absolute 0.7  0.1 - 1.0 (K/uL)    Eosinophils Relative 1  0 - 5 (%)    Eosinophils Absolute 0.1  0.0 - 0.7 (K/uL)    Basophils Relative 0  0 - 1 (%)    Basophils Absolute 0.0  0.0 - 0.1 (K/uL)   BASIC METABOLIC PANEL     Status: Abnormal   Collection Time   08/24/11 11:35 PM      Component Value Range Comment   Sodium 138  135 - 145 (mEq/L)    Potassium 4.2  3.5 - 5.1 (mEq/L)    Chloride 99  96 - 112 (mEq/L)    CO2 28  19 - 32 (mEq/L)    Glucose, Bld 121 (*) 70 - 99 (mg/dL)    BUN 27 (*) 6 - 23 (mg/dL)    Creatinine, Ser 1.61  0.50 - 1.10 (mg/dL)    Calcium 9.8  8.4 - 10.5 (mg/dL)    GFR calc non Af Amer 80 (*) >90 (mL/min)     GFR calc Af Amer >90  >90 (mL/min)     Ct Abdomen Pelvis W Contrast  08/25/2011  *RADIOLOGY REPORT*  Clinical Data: Left lower quadrant pain.  Nausea and vomiting.  CT ABDOMEN AND PELVIS WITH CONTRAST  Technique:  Multidetector CT imaging of the abdomen and pelvis was performed following the standard protocol during bolus administration of intravenous contrast.  Contrast: OMNIPAQUE IOHEXOL 300 MG/ML IV SOLN, 20mL OMNIPAQUE IOHEXOL 300 MG/ML IV SOLN  Comparison: 04/24/2008  Findings: The patient has a left inguinal or femoral hernia which is causing a small bowel obstruction.  This is best seen on images number 63 through 67 of series 2.  The liver, spleen, adrenal glands, and kidneys are normal except for a simple appearing 2 cm cyst on the inferior aspect of the midportion of the right kidney.  There is diffuse pancreatic atrophy.  The appendix and terminal ileum are normal.  There are diverticula in the distal colon but there is no diverticulitis.  IMPRESSION: Incarcerated left femoral hernia causing small bowel obstruction.  Original Report Authenticated By: Gwynn Burly, M.D.   Dg Chest Portable 1 View  08/25/2011  *RADIOLOGY REPORT*  Clinical Data: Preop bowel obstruction.  Abdominal pain.  PORTABLE CHEST - 1 VIEW  Comparison: 03/01/2011  Findings: Shallow inspiration.  Heart size and pulmonary vascularity are normal.  Linear fibrosis in the left lung base.  No focal airspace consolidation.  No blunting of costophrenic angles. No pneumothorax.  Tortuous aorta.  Degenerative changes and curvature of the spine.  No significant change since previous study.  IMPRESSION: No evidence of active pulmonary disease.  Original Report Authenticated By: Marlon Pel, M.D.    All other review of systems negative or noncontributory except as stated in the HPI  Blood pressure 141/67, pulse 64, temperature 97.9 F (36.6 C), temperature source Oral, resp. rate 15, SpO2 97.00%. General appearance:  alert, cooperative and no distress  Head: Normocephalic, without obvious abnormality, atraumatic Neck: no JVD and supple, symmetrical, trachea midline Resp: clear to auscultation bilaterally Chest wall: no tenderness Cardio: normal rate, regular rhythm GI: soft, no significant distension, LLQ tenderness and some tenderness to palpation in left inguinal region but I do not appreciate any bulge with valsalva, no right inguinal bulge as well., I do not appreciate any bulge in the femoral region as well. Extremities: extremities normal, atraumatic, no cyanosis or edema Neurologic: Grossly normal  Assessment/Plan: Bowel obstruction thought to be due to incarcerated left inguinal or femoral hernia. Though I do not appreciate any femoral or inguinal hernia on exam, she is tender in the left groin region and left lower quadrant consistent with CT scan findings of an incarcerated left femoral hernia. This could also represent an incarcerated obturator hernia. Given her tenderness and CT scan findings, have recommended diagnostic laparoscopy and possible repair with laparoscopic or open repair of her left inguinal hernia. Discussed the risks of surgery as well as the alternatives of doing nothing and the risk of that.  She would like to proceed with surgery as recommended. We discussed the risks of infection, bleeding, pain, scarring, need for open surgery, need for possible bowel resection, recurrence and she expressed understanding. To proceed with a laparoscopy to an open or laparoscopic repair as soon as possible.  Lodema Pilot DAVID 08/25/2011, 5:18 AM

## 2011-08-25 NOTE — Anesthesia Preprocedure Evaluation (Signed)
Anesthesia Evaluation  Patient identified by MRN, date of birth, ID band Patient awake    Reviewed: Allergy & Precautions, H&P , NPO status , Patient's Chart, lab work & pertinent test results  Airway Mallampati: III TM Distance: <3 FB Neck ROM: full  Mouth opening: Limited Mouth Opening  Dental No notable dental hx. (+) Caps, Teeth Intact and Dental Advidsory Given   Pulmonary neg pulmonary ROS,  clear to auscultation  Pulmonary exam normal       Cardiovascular Exercise Tolerance: Good hypertension, On Medications neg cardio ROS + Valvular Problems/Murmurs regular Normal    Neuro/Psych CVA, No Residual Symptoms Negative Neurological ROS  Negative Psych ROS   GI/Hepatic negative GI ROS, Neg liver ROS,   Endo/Other  Negative Endocrine ROS  Renal/GU negative Renal ROS  Genitourinary negative   Musculoskeletal   Abdominal Normal abdominal exam  (+)   Peds  Hematology negative hematology ROS (+)   Anesthesia Other Findings   Reproductive/Obstetrics negative OB ROS                           Anesthesia Physical Anesthesia Plan  ASA: III and Emergent  Anesthesia Plan: General ETT   Post-op Pain Management:    Induction:   Airway Management Planned:   Additional Equipment:   Intra-op Plan:   Post-operative Plan:   Informed Consent: I have reviewed the patients History and Physical, chart, labs and discussed the procedure including the risks, benefits and alternatives for the proposed anesthesia with the patient or authorized representative who has indicated his/her understanding and acceptance.   Dental Advisory Given  Plan Discussed with: CRNA  Anesthesia Plan Comments:         Anesthesia Quick Evaluation

## 2011-08-25 NOTE — Op Note (Signed)
[name], [name]                ACCOUNT NO.:  [id number]  MEDICAL RECORD NO.:  [id number]  LOCATION:  [facility]                         FACILITY:  [facility]  PHYSICIAN:  Lodema Pilot, MD       DATE OF BIRTH:  Sep 26, 1928  DATE OF PROCEDURE:  08/25/2011 DATE OF DISCHARGE:                              OPERATIVE REPORT   PROCEDURE:  Diagnostic laparoscopy with reduction of obturator hernia and laparoscopic repair of obturator hernia.  PREOPERATIVE DIAGNOSIS:  Incarcerated bowel obstruction and femoral hernia.  POSTOPERATIVE DIAGNOSIS:  Obturator hernia and bowel obstruction.  SURGEON:  Lodema Pilot, MD  ASSISTANT:  Dr. Azalia Bilis.  ANESTHESIA:  General endotracheal anesthesia with 30 mL of 1% lidocaine with epinephrine, 0.25% Marcaine in a 50:50 mixture.  FLUIDS:  1550 mL of fluids.  ESTIMATED BLOOD LOSS:  Minimal.  DRAINS:  None.  SPECIMENS:  None.  COMPLICATIONS:  None apparent.  FINDINGS:  Left obturator hernia, weakness of the inguinal floor in the direct space, but no evidence of any obvious hernia.  No indirect hernia on the left.  No right inguinal hernias were identified.  There was no obvious incarceration of any bowel.  She did have an adhesive band to the right lower quadrant, which was lysed using sharp dissection.  She also had an obvious area of incarcerated bowel, which was identified while running the bowel, but this was viable, and there was no other evidence of any obstruction.  COMPLICATIONS:  None apparent.  INDICATION FOR PROCEDURE:  [name] is an 78 year old female with 1- day history of left lower quadrant abdominal pain and nausea and vomiting, which she presented to the [facility].  CT scan of the abdomen was obtained, which demonstrated incarcerated left femoral hernia as the source of small bowel obstruction.  She was transferred to [facility] for further evaluation.  On exam, I did not feel any hernias in the inguinal indirect region,  but she had tenderness in her left lower quadrant and some abdominal distention consistent with bowel obstruction and given her CT findings and history and exam, we offered her diagnostic laparoscopy for further evaluation.  OPERATIVE DETAILS:  [name] was seen and evaluated in the emergency room.  Risks and benefits of the procedure were discussed in lay terms. Informed consent was obtained.  She was taken to the operating room and given prophylactic antibiotics and general endotracheal anesthesia was obtained.  Foley catheter was placed and her arms were tucked bilaterally.  Abdomen was prepped and draped in a standard surgical fashion.  Procedure time-out was performed with all operative team members to confirm proper patient, procedure, and a 12-mm balloon port was placed inferior to the umbilicus in the midline.  Two 5 mm right and left rectus ports were placed under direct visualization.  The abdomen was explored and there was no obvious inguinal hernias bilaterally.  She did have some weakness in the direct space in the left groin, but no obvious hernia.  No evidence of any incarcerated bowel.  She has had a defect in the medial and posterior to the iliac vessels on the left, near the ureter, which was seen also in the  area.  This appeared to be posterior to the pubic bone, concerning for obturator hernia, but as the patient was placed in Trendelenburg position, and the bowel was rolled out of the pelvis, there was no obvious incarceration of bowel.  I then decided to run the small intestine from the ileocecal valve.  Approximately, the appendix was seen and noted to be normal and the terminal ileum was run proximally.  Distal small bowel was normal in appearance.  She did have an adhesion to the right pelvic sidewall, which was a single band of adhesion.  This was divided using sharp dissection.  This did not appear to be the source of her obstruction. She also had some  interloop adhesions, which almost appeared as though she has had 2 areas of prior surgical anastomoses in a side-to-side fashion.  The small bowel was adhesed in a side-to-side fashion, but again this did not appear to be the source of her obstruction.  As I got just proximal to these areas, as I discontinued around the bowel, she had lifted up the small intestine, which had approximately one-third of the circumference of the small bowel, a circular area of edema and ecchymoses consistent as the source of the obstruction as this had been incarcerated and a potential hernia space.  The bowel proximal to this was fluid filled and mildly dilated, but was healthy in appearance. This appeared to be a mild obstruction and partial obstruction.  Again the bowel appeared viable and so then we again investigated the pelvis for any potential hernia spaces and the only potential space was the area in the left obturator region, which was still covered with peritoneum and was not significantly dimpled in, but was an area of potential bowel incarceration.  I laparoscopically placed 2 interrupted 2-0 silk sutures in this area from the peritoneum.  There were peritoneal stitches covering this defect with the peritoneum.  Care was taken to avoid injuring the iliac vessels laterally and the ureter was also seen coursing over the iliac vessels in this area as well.  Care was taken to avoid placing the sutures through the structures.  Two simple sutures were selected to approximate the peritoneum over this defect in order to prevent further potential herniation.  Also in order to place the stitches, there was an omental adhesion down the pelvis, which showed limited visualization of this area.  So a 0 Vicryl Endoloop was placed over this omentum and pulled out through the left lower quadrant abdominal wall using an Endoclose device in order to retract this anteriorly and laterally in order to place the sutures  in the pelvis.  With this space approximated, again the free fluid was suctioned from the pelvis, and there was no other potential hernia spaces identified.  This could have potentially been an adhesion through an internal hernia for another adhesion, although the only adhesion that we saw were used to interloop areas of adhesions and her adhesion to the right pelvic sidewall and these were very large defects and not likely the source of her obstruction.  We did not see any other defects. Therefore the right lateral trocar was removed under direct visualization.  Abdominal wall was noted to be hemostatic.  The umbilical trocar was removed and the fascia was approximated with interrupted 0 Vicryl sutures, and then the abdomen was reinsufflated through the left rectus trocar and the abdominal wall closure was noted be adequate without any evidence of bowel injury.  The air was removed from the  abdomen and the last trocar was removed.  The skin edges were approximated with 4-0 Monocryl subcuticular suture and the skin was washed and dried, and Dermabond was applied.  Foley catheter was removed at the end the case and all sponge and instrument counts were correct at the end of the case.  The patient tolerated the procedure well without apparent complication.          ______________________________ Lodema Pilot, MD     BL/MEDQ  D:  08/25/2011  T:  08/25/2011  Job:  [id number]

## 2011-08-25 NOTE — Brief Op Note (Signed)
08/24/2011 - 08/25/2011  10:59 AM  PATIENT:  [name]  78 y.o. female  PRE-OPERATIVE DIAGNOSIS:  incarcerated hernia  POST-OPERATIVE DIAGNOSIS:  obturator hernia  PROCEDURE:  Procedure(s): LAPAROSCOPY DIAGNOSTIC  SURGEON:  Surgeon(s): Rulon Abide, DO Atilano Ina, MD  PHYSICIAN ASSISTANT:   ASSISTANTS: wilson   ANESTHESIA:   general  EBL:  Total I/O In: 1600 [I.V.:1600] Out: 200 [Urine:200]  BLOOD ADMINISTERED:none  DRAINS: none   LOCAL MEDICATIONS USED:  MARCAINE 15CC and LIDOCAINE 15CC  SPECIMEN:  No Specimen  DISPOSITION OF SPECIMEN:  N/A  COUNTS:  YES  TOURNIQUET:  * No tourniquets in log *  DICTATION: .Other Dictation: Dictation Number [id number]  PLAN OF CARE: Admit to inpatient   PATIENT DISPOSITION:  PACU - hemodynamically stable.   Delay start of Pharmacological VTE agent (>24hrs) due to surgical blood loss or risk of bleeding:  {YES/NO/NOT APPLICABLE:20182

## 2011-08-25 NOTE — ED Notes (Signed)
Report called to TerriRN at [facility] ER

## 2011-08-25 NOTE — ED Notes (Signed)
During procedure vomited lg amt of undigested food.  Suctioned and turned onto side..   Fully awakened during episode of vomiting.  Back to normal    Alert  Cough on command and able to handle secrtions  Weston Scale of 6.   Cleaned  And made comfortable.  Daughter at bedside during entire procedure.  Several trys at inserting NG tube,unsuccessful.

## 2011-08-26 ENCOUNTER — Encounter (HOSPITAL_COMMUNITY): Payer: Self-pay | Admitting: General Surgery

## 2011-08-26 ENCOUNTER — Inpatient Hospital Stay (HOSPITAL_COMMUNITY): Payer: Medicare Other

## 2011-08-26 DIAGNOSIS — K4130 Unilateral femoral hernia, with obstruction, without gangrene, not specified as recurrent: Secondary | ICD-10-CM | POA: Diagnosis present

## 2011-08-26 DIAGNOSIS — E7800 Pure hypercholesterolemia, unspecified: Secondary | ICD-10-CM | POA: Diagnosis present

## 2011-08-26 DIAGNOSIS — D472 Monoclonal gammopathy: Secondary | ICD-10-CM | POA: Diagnosis present

## 2011-08-26 DIAGNOSIS — I10 Essential (primary) hypertension: Secondary | ICD-10-CM | POA: Diagnosis present

## 2011-08-26 LAB — CARDIAC PANEL(CRET KIN+CKTOT+MB+TROPI)
Relative Index: INVALID (ref 0.0–2.5)
Troponin I: <0.3 ng/mL (ref <0.30)

## 2011-08-26 LAB — BASIC METABOLIC PANEL
BUN: 4 mg/dL — ABNORMAL LOW (ref 6–23)
CO2: 27 mEq/L (ref 19–32)
Calcium: 8.6 mg/dL (ref 8.4–10.5)
Chloride: 104 mEq/L (ref 96–112)
Creatinine, Ser: 0.67 mg/dL (ref 0.50–1.10)
GFR calc Af Amer: >90 mL/min (ref >90)
GFR calc non Af Amer: 82 mL/min — ABNORMAL LOW (ref >90)
Glucose, Bld: 125 mg/dL — ABNORMAL HIGH (ref 70–99)
Potassium: 4.1 mEq/L (ref 3.5–5.1)
Potassium: 4.1 mEq/L (ref 3.5–5.1)
Sodium: 137 mEq/L (ref 135–145)

## 2011-08-26 LAB — CBC
HCT: 31.8 % — ABNORMAL LOW (ref 36.0–46.0)
HCT: 31.5 % — ABNORMAL LOW (ref 36.0–46.0)
Hemoglobin: 10.5 g/dL — ABNORMAL LOW (ref 12.0–15.0)
Hemoglobin: 10.6 g/dL — ABNORMAL LOW (ref 12.0–15.0)
MCH: 33 pg (ref 26.0–34.0)
MCV: 97.5 fL (ref 78.0–100.0)
RBC: 3.26 MIL/uL — ABNORMAL LOW (ref 3.87–5.11)
RBC: 3.21 MIL/uL — ABNORMAL LOW (ref 3.87–5.11)
RDW: 13.7 % (ref 11.5–15.5)
WBC: 7.1 10*3/uL (ref 4.0–10.5)

## 2011-08-26 NOTE — Progress Notes (Signed)
Patient ID: [name], female   DOB: 1929-04-08, 78 y.o.   MRN: [id number]   Subjective:     nursing staff shortly ago stating that the patient was short of breath but denied chest pain P. Complaining of NG discomfort. Complained of mild nausea.  Objective:        the patient is alert. Color good. Skin warm and dry. No acute distress. Elderly. Oriented.  Blood pressure 154/82, respiratory rate 16 and unlabored, heart rate 88 and regular, oxygen saturation 99% on 2 L.  Lungs decreased breath sounds at bases. No wheezing or rhonchi.  Abdomen: Slightly distended, soft, diffusely tender, no bowel sounds. Wounds okay.  EKG showed no acute changes.  Assessment:      transient dyspnea. Better now. Doubt acute cardiac or pulmonary problems.  Status post laparoscopic repair of incarcerated obturator hernia. Doubt acute abdominal complication.  Plan:          Will check chest x-ray, cardiac enzymes, and basic lab work.  Patient is to sei up and use incentive spirometer.  Care plan reinforced with nursing staff   Ernestene Mention 08/26/2011 9:39 PM

## 2011-08-26 NOTE — Significant Event (Signed)
Rapid Response Event Note  Overview: Time Called: 2100 Arrival Time: 2105 Event Type: Respiratory  Initial Focused Assessment:   Interventions:   Event Summary:  CALLED TO SEE PATIENT WITH SOB. PATIENT RESTING IN BED NO VISUAL DISTRESS. REPOSITIONED AND ASSESSED PAIN -BP ELEVATED AND MORPHINE 2MG  GIVEN. EKG DONE AND ORDERS HAD BEEN RECEIVED FROM DR. INGRAM PRIOR TO ARRIVAL.   LUNGS CLEAR WITH DIMENISHED BASES. N/G ASSESSED FOR PLACEMENT AND IRRIGATED. HER ABDOMEN WAS  VERY TENDER AND HYPOACTIVE BOWEL SOUNDS. PCXR WAS DONE AND PAIN NOT RELIEVED WITH MORPHINE BUT PT. STATES IT HAS MOVED FROM UPPER ABD. TO LOWER,SHE HAS NOT PASSED FLATUS. V/S STABLE AND DR. INGRAM IN TO SEE PATIENT. PT RESTING QUIETLY AND SOB HAS IMPROVED, REPORT GIVEN TO HER RN AND PLAN TO GIVE MORE PAIN MED. LAB ON THE WAY TO COLLECT CBC,BMET AND CARDIAC PANEL.    at          [facility]

## 2011-08-26 NOTE — Progress Notes (Signed)
1 Day Post-Op  Subjective: Still c/o abdominal pain.  She states that this is different than preop pain. C/o throat pain. No flatus or BM  Objective: Vital signs in last 24 hours: Temp:  [95.2 F (35.1 C)-99.3 F (37.4 C)] 99.2 F (37.3 C) (01/02 0706) Pulse Rate:  [67-88] 79  (01/02 0706) Resp:  [12-16] 16  (01/02 0706) BP: (99-125)/(47-96) 123/96 mmHg (01/02 0706) SpO2:  [94 %-100 %] 94 % (01/02 0706) Weight:  [133 lb 6.1 oz (60.5 kg)] 133 lb 6.1 oz (60.5 kg) (01/01 1253) Last BM Date: 08/24/11  Intake/Output from previous day: 01/01 0701 - 01/02 0700 In: 1725 [I.V.:1725] Out: 600 [Urine:600] Intake/Output this shift: Total I/O In: -  Out: 500 [Urine:500]  General appearance: alert, cooperative and no distress GI: soft, still with mild distension, mild diffuse tenderness RLQ>LLQ, wounds okay, no infection, no peritoneal signs.  Lab Results:   Basename 08/26/11 0400 08/24/11 2335  WBC 7.1 9.4  HGB 10.5* 12.9  HCT 31.8* 38.7  PLT 183 196   BMET  Basename 08/26/11 0400 08/24/11 2335  NA 137 138  K 4.1 4.2  CL 104 99  CO2 27 28  GLUCOSE 136* 121*  BUN 7 27*  CREATININE 0.67 0.70  CALCIUM 8.2* 9.8   PT/INR No results found for this basename: LABPROT:2,INR:2 in the last 72 hours ABG No results found for this basename: PHART:2,PCO2:2,PO2:2,HCO3:2 in the last 72 hours  Studies/Results: Ct Abdomen Pelvis W Contrast  08/25/2011  *RADIOLOGY REPORT*  Clinical Data: Left lower quadrant pain.  Nausea and vomiting.  CT ABDOMEN AND PELVIS WITH CONTRAST  Technique:  Multidetector CT imaging of the abdomen and pelvis was performed following the standard protocol during bolus administration of intravenous contrast.  Contrast: OMNIPAQUE IOHEXOL 300 MG/ML IV SOLN, 20mL OMNIPAQUE IOHEXOL 300 MG/ML IV SOLN  Comparison: 04/24/2008  Findings: The patient has a left inguinal or femoral hernia which is causing a small bowel obstruction.  This is best seen on images number 63  through 67 of series 2.  The liver, spleen, adrenal glands, and kidneys are normal except for a simple appearing 2 cm cyst on the inferior aspect of the midportion of the right kidney.  There is diffuse pancreatic atrophy.  The appendix and terminal ileum are normal.  There are diverticula in the distal colon but there is no diverticulitis.  IMPRESSION: Incarcerated left femoral hernia causing small bowel obstruction.  Original Report Authenticated By: Gwynn Burly, M.D.   Dg Chest Portable 1 View  08/25/2011  *RADIOLOGY REPORT*  Clinical Data: Preop bowel obstruction.  Abdominal pain.  PORTABLE CHEST - 1 VIEW  Comparison: 03/01/2011  Findings: Shallow inspiration.  Heart size and pulmonary vascularity are normal.  Linear fibrosis in the left lung base.  No focal airspace consolidation.  No blunting of costophrenic angles. No pneumothorax.  Tortuous aorta.  Degenerative changes and curvature of the spine.  No significant change since previous study.  IMPRESSION: No evidence of active pulmonary disease.  Original Report Authenticated By: Marlon Pel, M.D.    Anti-infectives: Anti-infectives     Start     Dose/Rate Route Frequency Ordered Stop   08/25/11 0900   ceFAZolin (ANCEF) IVPB 1 g/50 mL premix        1 g 100 mL/hr over 30 Minutes Intravenous  Once 08/25/11 0810 08/25/11 0848          Assessment/Plan: s/p Procedure(s): LAPAROSCOPY DIAGNOSTIC She has "different" pain than preop, I think that  her exam is appropriate for POD 1, wbc normal and vitals normal.  awaiting bowel function.  LOS: 2 days    Lodema Pilot DAVID 08/26/2011

## 2011-08-27 ENCOUNTER — Inpatient Hospital Stay (HOSPITAL_COMMUNITY): Payer: Medicare Other

## 2011-08-27 DIAGNOSIS — I639 Cerebral infarction, unspecified: Secondary | ICD-10-CM | POA: Insufficient documentation

## 2011-08-27 DIAGNOSIS — M129 Arthropathy, unspecified: Secondary | ICD-10-CM | POA: Insufficient documentation

## 2011-08-27 DIAGNOSIS — Z8673 Personal history of transient ischemic attack (TIA), and cerebral infarction without residual deficits: Secondary | ICD-10-CM

## 2011-08-27 DIAGNOSIS — I359 Nonrheumatic aortic valve disorder, unspecified: Secondary | ICD-10-CM | POA: Insufficient documentation

## 2011-08-27 DIAGNOSIS — M1990 Unspecified osteoarthritis, unspecified site: Secondary | ICD-10-CM

## 2011-08-27 DIAGNOSIS — I351 Nonrheumatic aortic (valve) insufficiency: Secondary | ICD-10-CM

## 2011-08-27 LAB — BASIC METABOLIC PANEL
BUN: 4 mg/dL — ABNORMAL LOW (ref 6–23)
Creatinine, Ser: 0.61 mg/dL (ref 0.50–1.10)
GFR calc non Af Amer: 83 mL/min — ABNORMAL LOW (ref >90)
Glucose, Bld: 119 mg/dL — ABNORMAL HIGH (ref 70–99)
Potassium: 4 mEq/L (ref 3.5–5.1)

## 2011-08-27 LAB — CBC
HCT: 31.4 % — ABNORMAL LOW (ref 36.0–46.0)
Hemoglobin: 10.4 g/dL — ABNORMAL LOW (ref 12.0–15.0)
MCH: 32.6 pg (ref 26.0–34.0)
MCHC: 33.1 g/dL (ref 30.0–36.0)
MCV: 98.4 fL (ref 78.0–100.0)
RBC: 3.19 MIL/uL — ABNORMAL LOW (ref 3.87–5.11)

## 2011-08-27 LAB — DIFFERENTIAL
Basophils Relative: 1 % (ref 0–1)
Eosinophils Absolute: 0.2 10*3/uL (ref 0.0–0.7)
Lymphs Abs: 1.1 10*3/uL (ref 0.7–4.0)
Monocytes Absolute: 1.2 10*3/uL — ABNORMAL HIGH (ref 0.1–1.0)
Monocytes Relative: 15 % — ABNORMAL HIGH (ref 3–12)
Neutrophils Relative %: 69 % (ref 43–77)

## 2011-08-27 MED ORDER — IOHEXOL 300 MG/ML  SOLN
100.0000 mL | Freq: Once | INTRAMUSCULAR | Status: AC | PRN
  Administered 2011-08-27: 80 mL via INTRAVENOUS

## 2011-08-27 NOTE — Progress Notes (Signed)
08/26/10, Kathi Der RNC-MNN, BSN, [phone], CM received referral to begin discharge planning, pt lives alone.  CM met with pt.  Pt sates that she has used [facility] services in the past,  but cannot recall the name.  She will have someone check and will let me know.  Pt has PT/OT consult ordered-will await recommendation of services.  Will follow.

## 2011-08-27 NOTE — Anesthesia Postprocedure Evaluation (Signed)
Anesthesia Post Note  Patient: [name]  Procedure(s) Performed:  LAPAROSCOPY DIAGNOSTIC - reduction obturator hernia obturator hernia repair  Anesthesia type: General  Patient location: PACU  Post pain: Pain level controlled  Post assessment: Post-op Vital signs reviewed  Last Vitals:  Filed Vitals:   08/27/11 2129  BP: 166/80  Pulse: 78  Temp: 37 C  Resp: 16    Post vital signs: Reviewed  Level of consciousness: sedated  Complications: No apparent anesthesia complications

## 2011-08-27 NOTE — Progress Notes (Signed)
RRT called

## 2011-08-27 NOTE — Plan of Care (Signed)
Problem: Phase II Progression Outcomes Goal: Surgical site without signs of infection Outcome: Progressing Patients abd less distended than was noted 08/26/11 7p shift; not as tender, per patient, less redness noted. LLQ with BS. No s/s of infection at dermabond sites

## 2011-08-27 NOTE — Progress Notes (Signed)
OT Cancellation Note  ___Treatment cancelled today due to medical issues with patient which prohibited therapy  ___ Treatment cancelled today due to patient receiving procedure or test   ___ Treatment cancelled today due to patient's refusal to participate   _x__ Treatment cancelled today due to pt does not feel up to OT today;  She states she has another test scheduled for today.   Will reattempt tomorrow.      [place], OTR/L [phone] 08/27/2011

## 2011-08-27 NOTE — Progress Notes (Signed)
2 Days Post-Op  Subjective: No flatus or BM.  C/O stomach being tender, SOB last pm may be related to her NG  CXR, shows some increased LLL atelectasis,NG just barely in and not working when I got here the sump had a wet filter in it. She has a low grade fever 99.9, now sl. Tachy 88, BP 189/79, Sats 96% R/a. Labs OK Objective: Vital signs in last 24 hours: Temp:  [98 F (36.7 C)-99.7 F (37.6 C)] 99.6 F (37.6 C) (01/03 0619) Pulse Rate:  [75-82] 75  (01/03 0619) Resp:  [14-20] 16  (01/03 0619) BP: (134-180)/(75-88) 155/82 mmHg (01/03 0619) SpO2:  [94 %-100 %] 97 % (01/03 0619) FiO2 (%):  [2 %] 2 % (01/03 0619) Last BM Date: 08/24/11  Intake/Output from previous day: 01/02 0701 - 01/03 0700 In: 0  Out: 2575 [Urine:2350; Emesis/NG output:225] Intake/Output this shift: Total I/O In: -  Out: 350 [Urine:350]  General appearance: alert, cooperative and no distress, but very uncomfortable.   Resp: rales in bases,   GI: mild distension, bowel sounds hypoactive, tender to palpation. Extremities: She doesn't have edema, but she's tender just looking for Edema , in fact feel tender all over. 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  I do not hear a murmur. Lab Results:   Basename 08/27/11 0411 08/26/11 2242  WBC 8.1 8.0  HGB 10.4* 10.6*  HCT 31.4* 31.5*  PLT 164 159    BMET  Basename 08/27/11 0411 08/26/11 2242  NA 138 137  K 4.0 4.1  CL 105 105  CO2 25 26  GLUCOSE 119* 125*  BUN 4* 4*  CREATININE 0.61 0.63  CALCIUM 8.5 8.6   PT/INR No results found for this basename: LABPROT:2,INR:2 in the last 72 hours   Studies/Results: Dg Chest 1 View  08/26/2011  *RADIOLOGY REPORT*  Clinical Data: Shortness of breath.  CHEST - 1 VIEW  Comparison: 08/25/2011.  Findings: The heart remains normal in size.  Previously demonstrated linear density at the left lung base currently has a more triangular configuration and appears larger.  Stable mildly prominent interstitial  markings.  Diffuse osteopenia.  Nasogastric tube side hole just above the gastroesophageal junction.  The tube tip is in the proximal stomach.  IMPRESSION:  1.  Nasogastric tube side hole just above the gastroesophageal junction, in the distal esophagus. 2.  Mildly increased left lower lobe atelectasis.  Original Report Authenticated By: Darrol Angel, M.D.    Anti-infectives: Anti-infectives     Start     Dose/Rate Route Frequency Ordered Stop   08/25/11 0900   ceFAZolin (ANCEF) IVPB 1 g/50 mL premix        1 g 100 mL/hr over 30 Minutes Intravenous  Once 08/25/11 0810 08/25/11 0848         Current Facility-Administered Medications  Medication Dose Route Frequency Provider Last Rate Last Dose  . dextrose 5 % and 0.45 % NaCl with KCl 20 mEq/L infusion   Intravenous Continuous Rulon Abide, DO 125 mL/hr at 08/26/11 2049    . enoxaparin (LOVENOX) injection 40 mg  40 mg Subcutaneous Q24H Rulon Abide, DO   40 mg at 08/27/11 0818  . morphine 2 MG/ML injection 1-4 mg  1-4 mg Intravenous Q1H PRN Rulon Abide, DO   2 mg at 08/27/11 0818  . ondansetron (ZOFRAN) injection 4 mg  4 mg Intravenous Q6H PRN Rulon Abide, DO        Assessment/Plan Incarcerated  hernia with laparoscopic repair.08/25/11 Hypertension High cholesterol Right Patellar fx home on coumadin 02/2011 Spinal stenosis disc dz. 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 Followed in [facility].. Also on Neurontin, she says it was given to her by  A Neurologist some years ago.  She thinks it's for arthritis pain Arthritis in shoulders., limited activity at home.  Plan: get info from cardiology in [place].Get OOB and start mobilizing more.  Get NG working.  Ask case manager to see also.  LOS: 3 days    JENNINGS,WILLARD 08/27/2011  She is still tender and no bowel function.  Will repeat Ct to see if still with blockage.

## 2011-08-27 NOTE — Progress Notes (Signed)
PT Cancellation Note  ___Treatment cancelled today due to medical issues with patient which prohibited   therapy  _X__ Treatment cancelled today due to patient receiving procedure or test receiving contrast for CT scan later this pm.  Also pt and RN report she walked earlier and sat up in chair 2 hours.  Will retry in am.  ___ Treatment cancelled today due to patient's refusal to participate   ___ Treatment cancelled today due to  [place], Benson [phone] 08/27/2011

## 2011-08-27 NOTE — Progress Notes (Signed)
RRT to patients room - 1529 - see Elnita Maxwell RN note.

## 2011-08-27 NOTE — Plan of Care (Signed)
Problem: Phase II Progression Outcomes Goal: Tolerating diet Outcome: Not Applicable Date Met:  08/27/11 Patient remains NPO

## 2011-08-27 NOTE — Progress Notes (Signed)
Patient appears to be resting without resp distress, eyes closed, resp even and unlabored. Will continue to monitor

## 2011-08-27 NOTE — Plan of Care (Signed)
Problem: Phase II Progression Outcomes Goal: Progressing with IS, TCDB Outcome: Progressing Patient is able to use IS up to 1000. Encouraged to keep trying.

## 2011-08-27 NOTE — Progress Notes (Addendum)
Approximately at this time, while assessing the patient,she voiced that she was "having a hard time breathing"  Auscultated lungs to find them to be shallow and clear. Repositioned hob to about 60%. No c/o chest pain at present. VS were found to be stable at 99.7, 82, 16, 154/82 99 on 2LNC. Dr. Foye Spurling notified of patients  Findings. New orders were given. Patients abdomen was found to be extended, tender with slight redness around dermabond sites, very tender to touch. Medicated with 2mg  morphine IV after moving paitent up in bed for comfort.  NGT maintained.

## 2011-08-27 NOTE — Clinical Documentation Improvement (Signed)
COMPLICATION DOCUMENTATION CLARIFICATION QUERY  THIS DOCUMENT IS NOT A PERMANENT PART OF THE MEDICAL RECORD  TO RESPOND TO THE THIS QUERY, FOLLOW THE INSTRUCTIONS BELOW:  1. If needed, update documentation for the patient's encounter via the notes activity.  2. Access this query again and click edit on the In [employer].  3. After updating, or not, click F2 to complete all highlighted (required) fields concerning your review. Select "additional documentation in the medical record" OR "no additional documentation provided".  4. Click Sign note button.  5. The deficiency will fall out of your In Basket *Please let us know if you are not able to complete this workflow by phone or e-mail (listed below).  Please update your documentation within the medical record to reflect your response to this query.                                                                                        08/27/11   Dear Dr. Biagio Quint Marton Redwood,  In a better effort to capture your patient's severity of illness, reflect appropriate length of stay and utilization of resources, a review of the patient medical record has revealed the following indicators.    Based on your clinical judgment, please clarify and document in a progress note and/or discharge summary the clinical condition associated with the following supporting information:  In responding to this query please exercise your independent judgment.  The fact that a  query is asked, does not imply that any particular answer is desired or expected.  The following diagnosis:  Incarcerated left femoral hernia causing small bowel obstruction.  Procedure: Lap repair 08/25/11  Please clarify in progress notes /d/c summary whether or not pt meets any of underlying cause for abd distension after recent surgery. Thank you.  -Post op Ileus  -Paralytic ileus  Other Condition___normal resolution of preopertive bowel obstruction_______________  Cannot  Clinically Determine_____________   Supporting Information: Risk Factors: Bowel obstruction, incarcerated femoral hernia with repair  Signs & Symptoms: Abd tender , no bowel function 08/27/11 GI: mild distension, bowel sounds hypoactive 08/27/11 No flatus or BM    Diagnostics: CT scan of abd scheduled  Treatment: NG tube  Encouraged ambulation    You may use possible, probable, or suspect with inpatient documentation. possible, probable, suspected diagnoses MUST be documented at the time of discharge  Reviewed: additional documentation in the medical record  Thank You,  Andy Gauss RN   Clinical Documentation Specialist:  Pager [phone] Email: [email or web address]  Health Information Management [place]

## 2011-08-28 LAB — BASIC METABOLIC PANEL WITH GFR
BUN: 5 mg/dL — ABNORMAL LOW (ref 6–23)
CO2: 26 meq/L (ref 19–32)
Calcium: 8.6 mg/dL (ref 8.4–10.5)
Chloride: 103 meq/L (ref 96–112)
Creatinine, Ser: 0.58 mg/dL (ref 0.50–1.10)
GFR calc Af Amer: >90 mL/min
GFR calc non Af Amer: 85 mL/min — ABNORMAL LOW
Glucose, Bld: 125 mg/dL — ABNORMAL HIGH (ref 70–99)
Potassium: 3.6 meq/L (ref 3.5–5.1)
Sodium: 136 meq/L (ref 135–145)

## 2011-08-28 NOTE — Progress Notes (Signed)
Physical Therapy Evaluation Patient Details Name: [name] MRN: [id number] DOB: September 15, 1928 Today's Date: 08/28/2011 1352-1415 Ev2 Discharge Recommendation: Patient hesitant to accept, but would benefit from HHPT at d/c.  Problem List:  Patient Active Problem List  Diagnoses  . IgM monoclonal gammopathy of uncertain significance  . Hypertension  . Incarcerated femoral hernia  . Elevated cholesterol  . AI (aortic insufficiency)  . Arthritis  . Hx-TIA (transient ischemic attack)    Past Medical History:  Past Medical History  Diagnosis Date  . Hypertension   . High cholesterol    Past Surgical History:  Past Surgical History  Procedure Date  . Abdominal hysterectomy   . Laparoscopy 08/25/2011    Procedure: LAPAROSCOPY DIAGNOSTIC;  Surgeon: Rulon Abide, DO;  Location: [facility]S;  Service: General;  Laterality: N/A;  reduction obturator hernia obturator hernia repair    PT Assessment/Plan/Recommendation PT Assessment Clinical Impression Statement: Patient with hernia s/p repair presents with mild imbalance and gait abnnormalities.  Discussed with her use of walker at home to decreased fall risk and recommended HHPT for safety eval.  Patient hesitant about HHPT and stated she would let them call her, but not sure about having them come to her home.  Will benefit from skilled PT in acute to improve safety and independence with ambulation and stair negotiation. PT Recommendation/Assessment: Patient will need skilled PT in the acute care venue PT Problem List: Decreased balance;Decreased mobility Barriers to Discharge: Decreased caregiver support PT Therapy Diagnosis : Abnormality of gait;Generalized weakness PT Plan PT Frequency: Min 3X/week PT Treatment/Interventions: Stair training;Gait training;DME instruction;Patient/family education PT Recommendation Follow Up Recommendations: Home health PT Equipment Recommended: None recommended by PT PT Goals  Acute Rehab PT  Goals PT Goal Formulation: With patient/family Time For Goal Achievement: 7 days Pt will go Sit to Stand: with modified independence PT Goal: Sit to Stand - Progress: Not met Pt will go Stand to Sit: with modified independence PT Goal: Stand to Sit - Progress: Not met Pt will Stand: with modified independence;with no upper extremity support;1 - 2 min (during simple functional task) PT Goal: Stand - Progress: Not met Pt will Ambulate: >150 feet;with modified independence;with least restrictive assistive device PT Goal: Ambulate - Progress: Not met Pt will Go Up / Down Stairs: 3-5 stairs;with modified independence;with rail(s) PT Goal: Up/Down Stairs - Progress: Not met  PT Evaluation Precautions/Restrictions  Precautions Precautions: Fall Prior Functioning  Home Living Lives With: Alone Type of Home: House Home Layout: One level Home Access: Stairs to enter Entrance Stairs-Rails: Right Entrance Stairs-Number of Steps: 3 Bathroom Shower/Tub: Engineer, manufacturing systems: Standard (vanity next to it) Home Adaptive Equipment: Straight cane;Walker - rolling;Tub transfer bench Prior Function Level of Independence: Independent with basic ADLs;Independent with homemaking with ambulation;Independent with gait Driving: Yes Cognition Cognition Arousal/Alertness: Awake/alert Overall Cognitive Status: Appears within functional limits for tasks assessed Sensation/Coordination Sensation Light Touch: Appears Intact Extremity Assessment RLE Assessment RLE Assessment: Exceptions to [facility] RLE AROM (degrees) Overall AROM Right Lower Extremity: Within functional limits for tasks assessed RLE Strength RLE Overall Strength: Deficits RLE Overall Strength Comments: hip flexion 3+/5, ankle dorsiflexion 4+/5, knee extension 4/5 mild pain, knee flexion 4/5 LLE Assessment LLE Assessment: Exceptions to WFL LLE AROM (degrees) Overall AROM Left Lower Extremity: Within functional limits for tasks  assessed LLE Strength LLE Overall Strength: Deficits LLE Overall Strength Comments: hip flexion 4-/5, knee extension 4+/5, knee flexion 4/5, ankle dorsiflexion 4+/5 Mobility (including Balance) Bed Mobility Bed Mobility: Yes Supine to  Sit: 6: Modified independent (Device/Increase time);HOB elevated (Comment degrees) (HOB 50*) Sitting - Scoot to Edge of Bed: 6: Modified independent (Device/Increase time) Transfers Transfers: Yes Sit to Stand: 4: Min assist;From bed;With upper extremity assist Sit to Stand Details (indicate cue type and reason): struggled to get up unaided first, successful with min assist Stand to Sit: 5: Supervision;To chair/3-in-1;With armrests Stand to Sit Details: high seat and uncontrolled landing Ambulation/Gait Ambulation/Gait: Yes Ambulation/Gait Assistance: 4: Min assist Ambulation/Gait Assistance Details (indicate cue type and reason): minguard assist with patient demo some imbalance and reaches occasionally to wall rail. Ambulation Distance (Feet): 400 Feet Assistive device: None Gait Pattern: Decreased stride length  Posture/Postural Control Posture/Postural Control: No significant limitations Exercise    End of Session PT - End of Session Activity Tolerance: Patient tolerated treatment well Patient left: in chair;with call bell in reach;with family/visitor present General Behavior During Session: [facility] for tasks performed Cognition: [facility] for tasks performed  [facility] 08/28/2011, 2:44 PM

## 2011-08-28 NOTE — Progress Notes (Signed)
Occupational Therapy Evaluation Patient Details Name: [name] MRN: [id number] DOB: [birth year] Today's Date: 08/28/2011 1400 1423 ev2  Problem List:  Patient Active Problem List  Diagnoses  . IgM monoclonal gammopathy of uncertain significance  . Hypertension  . Incarcerated femoral hernia  . Elevated cholesterol  . AI (aortic insufficiency)  . Arthritis  . Hx-TIA (transient ischemic attack)    Past Medical History:  Past Medical History  Diagnosis Date  . Hypertension   . High cholesterol    Past Surgical History:  Past Surgical History  Procedure Date  . Abdominal hysterectomy   . Laparoscopy 08/25/2011    Procedure: LAPAROSCOPY DIAGNOSTIC;  Surgeon: Rulon Abide, DO;  Location: [facility];  Service: General;  Laterality: N/A;  reduction obturator hernia obturator hernia repair    OT Assessment/Plan/Recommendation OT Assessment Clinical Impression Statement: this 78 yo female was admitted for incarcerated hernia surgery.  She is currently overall min guard to min A for Adls and has AE at home.  Will follow in acute for mod I goals.  Do not anticipate need for follow up OT OT Recommendation/Assessment: Patient will need skilled OT in the acute care venue OT Problem List: Decreased activity tolerance;Decreased strength OT Therapy Diagnosis : Generalized weakness OT Recommendation Follow Up Recommendations: None Equipment Recommended: None recommended by OT OT Goals Acute Rehab OT Goals OT Goal Formulation: With patient Time For Goal Achievement: 2 weeks ADL Goals Pt Will Transfer to Toilet: with modified independence;Ambulation;Regular height toilet ADL Goal: Toilet Transfer - Progress: Not met Pt Will Perform Toileting - Clothing Manipulation: with modified independence;Standing ADL Goal: Toileting - Clothing Manipulation - Progress: Not met Pt Will Perform Toileting - Hygiene: with modified independence;Standing at 3-in-1/toilet ADL Goal: Toileting - Hygiene -  Progress: Not met Miscellaneous OT Goals Miscellaneous OT Goal #1: Pt will gather clothes at mod I level OT Goal: Miscellaneous Goal #1 - Progress: Not met  OT Evaluation Precautions/Restrictions  Precautions Precautions: Fall Restrictions Weight Bearing Restrictions: No Prior Functioning Home Living Lives With: Alone Type of Home: House Home Layout: One level Home Access: Stairs to enter Entrance Stairs-Rails: Right Entrance Stairs-Number of Steps: 3 Bathroom Shower/Tub: Engineer, manufacturing systems: Standard (vanity next to it) Home Adaptive Equipment: Straight cane;Walker - rolling;Tub transfer bench Prior Function Level of Independence: Independent with basic ADLs;Independent with homemaking with ambulation;Independent with gait Driving: Yes ADL ADL Eating/Feeding: Performed;Set up;Other (comment) (drink only) Where Assessed - Eating/Feeding: Chair Grooming: Simulated;Set up Where Assessed - Grooming: Sitting, chair;Supported Upper Body Bathing: Simulated;Set up Where Assessed - Upper Body Bathing: Sitting, chair;Supported Lower Body Bathing: Minimal assistance;Simulated;Other (comment) (has AE at home) Where Assessed - Lower Body Bathing: Sit to stand from chair Upper Body Dressing: Performed;Minimal assistance (robe:  iv) Where Assessed - Upper Body Dressing: Standing Lower Body Dressing: Simulated;Minimal assistance;Other (comment) (AE) Where Assessed - Lower Body Dressing: Sit to stand from chair Toilet Transfer: Simulated;Minimal assistance;Other (comment) (min guard ambulating to recliner) Toilet Transfer Method: Ambulating Toileting - Clothing Manipulation: Simulated;Supervision/safety;Other (comment) (min A for sit to stand) Where Assessed - Toileting Clothing Manipulation: Standing Toileting - Hygiene: Simulated;Supervision/safety Where Assessed - Toileting Hygiene: Sit to stand from 3-in-1 or toilet (min A for sit to stand) Tub/Shower Transfer: Other  (comment) (pt feels she will be able to get onto tub seat with grab bar) ADL Comments: co eval with PT.  Pt has previously gone to rehab:  wants to go home.  Is home alone and has DME/AE.  Ambulated without device during  session; min guard to occasional min A Vision/Perception    Cognition Cognition Arousal/Alertness: Awake/alert Overall Cognitive Status: Appears within functional limits for tasks assessed Sensation/Coordination Sensation Light Touch: Appears Intact Extremity Assessment RUE Assessment RUE Assessment: Within Functional Limits LUE Assessment LUE Assessment: Exceptions to [employer] (shoulder AROM 70; AAROM 90; tight end feel) Mobility  Bed Mobility Bed Mobility: Yes Supine to Sit: 6: Modified independent (Device/Increase time) Sitting - Scoot to Edge of Bed: 6: Modified independent (Device/Increase time) Transfers Transfers: Yes Sit to Stand: 4: Min assist;From bed;With upper extremity assist Sit to Stand Details (indicate cue type and reason): struggled to get up unaided first, successful with min assist Stand to Sit: 5: Supervision Stand to Sit Details: high seat and uncontrolled landing Exercises   End of Session OT - End of Session Activity Tolerance: Patient tolerated treatment well Patient left: in chair;with call bell in reach General Behavior During Session: [facility] for tasks performed Cognition: [facility] for tasks performed   Kainon Varady [phone] 08/28/2011, 3:07 PM

## 2011-08-28 NOTE — Plan of Care (Signed)
Problem: Phase II Progression Outcomes Goal: Return of bowel function (flatus, BM) IF ABDOMINAL SURGERY:  Outcome: Progressing Flatus, no BM

## 2011-08-28 NOTE — Progress Notes (Signed)
OT Cancellation Note  ___Treatment cancelled today due to medical issues with patient which prohibited therapy  ___ Treatment cancelled today due to patient receiving procedure or test   ___ Treatment cancelled today due to patient's refusal to participate   _x__ Treatment cancelled today due to fatique.  Will reattempt later today.    [place], OTR/L [phone] 08/28/2011

## 2011-08-28 NOTE — Progress Notes (Signed)
08/28/11, Kathi Der RNC-MNN, BSN, CM spoke with pt again concerning HH services.  Pt states that she has used [facility] before, but is not sure she wants to use them again.  Pt presented with list of Home Health Agencies that service [facility].  Pt states she will review to determine which one she would like to use if [facility] services ordered.  Pt declines to pick one at this time.  Will follow.

## 2011-08-28 NOTE — Progress Notes (Signed)
3 Days Post-Op  Subjective: Some flatus today.  Abd still distended slightly, slightly tender.  Ng drainage not recorded, changed last PM. And in cannister.  Still having problems with sump on NG.      Objective: Vital signs in last 24 hours: Temp:  [98.6 F (37 C)-98.9 F (37.2 C)] 98.7 F (37.1 C) (01/04 0622) Pulse Rate:  [70-78] 70  (01/04 0622) Resp:  [15-16] 16  (01/04 0622) BP: (155-173)/(78-80) 155/78 mmHg (01/04 0622) SpO2:  [96 %-98 %] 96 % (01/04 0622) Last BM Date: 08/24/11  Intake/Output from previous day: 01/03 0701 - 01/04 0700 In: 1500 [I.V.:1500] Out: 2150 [Urine:2150] Intake/Output this shift:   PE:  Alert less uncomfortable than yesterday., chest, few rales in base.  Abd incisions look good.  Still slightly distended, BS still hypoactive, slightly tender.   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  I do not hear a murmur. Lab Results:   Basename 08/27/11 0411 08/26/11 2242  WBC 8.1 8.0  HGB 10.4* 10.6*  HCT 31.4* 31.5*  PLT 164 159    BMET  Basename 08/28/11 0400 08/27/11 0411  NA 136 138  K 3.6 4.0  CL 103 105  CO2 26 25  GLUCOSE 125* 119*  BUN 5* 4*  CREATININE 0.58 0.61  CALCIUM 8.6 8.5   PT/INR No results found for this basename: LABPROT:2,INR:2 in the last 72 hours   Studies/Results: Dg Chest 1 View  08/26/2011  *RADIOLOGY REPORT*  Clinical Data: Shortness of breath.  CHEST - 1 VIEW  Comparison: 08/25/2011.  Findings: The heart remains normal in size.  Previously demonstrated linear density at the left lung base currently has a more triangular configuration and appears larger.  Stable mildly prominent interstitial markings.  Diffuse osteopenia.  Nasogastric tube side hole just above the gastroesophageal junction.  The tube tip is in the proximal stomach.  IMPRESSION:  1.  Nasogastric tube side hole just above the gastroesophageal junction, in the distal esophagus. 2.  Mildly increased left lower lobe atelectasis.   Original Report Authenticated By: Darrol Angel, M.D.   Ct Abdomen Pelvis W Contrast  08/27/2011  *RADIOLOGY REPORT*  Clinical Data: Status post laparotomy for small bowel obstruction. No symptomatic improvement.  CT ABDOMEN AND PELVIS WITH CONTRAST  Technique:  Multidetector CT imaging of the abdomen and pelvis was performed following the standard protocol during bolus administration of intravenous contrast.  Contrast: 80mL OMNIPAQUE IOHEXOL 300 MG/ML IV SOLN  Comparison: 08/25/2011.  Findings: The previously seen small bowel dilatation is no longer demonstrated.  The previously demonstrated left inguinal or femoral hernia containing a bowel loop is no longer demonstrated. Currently, there are fat-containing lymph nodes in that region.  Interval small amounts of subcutaneous air, primarily on the left. Interval right rectus abdominus muscle hematoma measuring 4.0 x 1.6 cm in maximum dimensions on image number 46.  Small umbilical hernia containing fat.  Small right renal and right lobe liver cysts.  Vicariously excreted contrast in the gallbladder.  Nasogastric tube tip in the stomach.  Scattered colonic diverticula without evidence of diverticulitis. Diffuse pancreatic atrophy.  Unremarkable adrenal glands and left kidney.  Small splenic calcified granuloma.  Unremarkable urinary bladder.  Surgically absent uterus.  No adnexal masses or enlarged lymph nodes.  Atheromatous arterial calcifications.  Small left pleural effusion and mild left lower lobe atelectasis.  Right superior and inferior pubic ramus fractures with nonunion.  Old right sacral ala fracture deformity with sclerosis.  Lumbar and  lower thoracic spine degenerative changes and scoliosis.  IMPRESSION:  1.  Interval repair interval repair of the previously demonstrate left inguinal or femoral hernia. 2.  Resolved small bowel obstruction. 3.  Interval small right rectus abdominus muscle hematoma. 4.  Small umbilical hernia containing fat. 5.  Small  left pleural effusion and mild left lower lobe atelectasis. 6.  Colonic diverticulosis.  Original Report Authenticated By: Darrol Angel, M.D.    Anti-infectives: Anti-infectives     Start     Dose/Rate Route Frequency Ordered Stop   08/25/11 0900   ceFAZolin (ANCEF) IVPB 1 g/50 mL premix        1 g 100 mL/hr over 30 Minutes Intravenous  Once 08/25/11 0810 08/25/11 0848         Current Facility-Administered Medications  Medication Dose Route Frequency Provider Last Rate Last Dose  . dextrose 5 % and 0.45 % NaCl with KCl 20 mEq/L infusion   Intravenous Continuous Rulon Abide, DO 125 mL/hr at 08/28/11 1610    . enoxaparin (LOVENOX) injection 40 mg  40 mg Subcutaneous Q24H Rulon Abide, DO   40 mg at 08/28/11 9604  . iohexol (OMNIPAQUE) 300 MG/ML solution 100 mL  100 mL Intravenous Once PRN Medication Radiologist   80 mL at 08/27/11 1640  . morphine 2 MG/ML injection 1-4 mg  1-4 mg Intravenous Q1H PRN Rulon Abide, DO   2 mg at 08/28/11 5409  . ondansetron (ZOFRAN) injection 4 mg  4 mg Intravenous Q6H PRN Rulon Abide, DO        Assessment/Plan Incarcerated hernia with laparoscopic repair.08/25/11 Hypertension High cholesterol Right Patellar fx home on coumadin 02/2011 Spinal stenosis disc dz. 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 [facility]ed in [facility] Also on Neurontin, she says it was given to her by  A Neurologist some years ago.  She thinks it's for arthritis pain Arthritis in shoulders., limited activity at home.  Plan: CT shows SBO resolved, repainr of L inguinal or femoral hernia, rectus muscle hematoma. LLL atelectasis/effusion.  Continue drainage, mobilize, pulmonary toilet.  JENNINGS,WILLARD 08/28/2011  She looks better today.  He abdominal tenderness is in the area of the rectus hematoma.  I think that she is improving.

## 2011-08-29 MED ORDER — HYDROCODONE-ACETAMINOPHEN 5-325 MG PO TABS
1.0000 | ORAL_TABLET | ORAL | Status: DC | PRN
  Administered 2011-08-29 – 2011-08-30 (×3): 1 via ORAL
  Filled 2011-08-29 (×3): qty 1

## 2011-08-29 NOTE — Progress Notes (Signed)
Patient ID: [name], female   DOB: 09-Jul-1929, 78 y.o.   MRN: [id number]  General Surgery - [facility] - Progress Note  POD# 4  Subjective: Patient improving.  Mild pain.  IV out.  No nausea with CL diet.  BM this AM.  Objective: Vital signs in last 24 hours: Temp:  [97.8 F (36.6 C)-99.4 F (37.4 C)] 99.2 F (37.3 C) (01/05 0545) Pulse Rate:  [60-80] 75  (01/05 0545) Resp:  [16-18] 18  (01/05 0545) BP: (157-172)/(78-83) 157/83 mmHg (01/05 0545) SpO2:  [95 %-96 %] 95 % (01/05 0545) Last BM Date: 08/29/11  Intake/Output from previous day: 01/04 0701 - 01/05 0700 In: 950 [P.O.:600; NG/GT:350] Out: 1800 [Urine:1800]  Exam: HEENT - clear, not icteric Neck - soft, no mass Chest - clear bilaterally Cor - RRR, no murmur Abd - soft, wounds clear and dry, positive BS Ext - no significant edema Neuro - grossly intact, no focal deficits  Lab Results:   Basename 08/27/11 0411 08/26/11 2242  WBC 8.1 8.0  HGB 10.4* 10.6*  HCT 31.4* 31.5*  PLT 164 159     Basename 08/28/11 0400 08/27/11 0411  NA 136 138  K 3.6 4.0  CL 103 105  CO2 26 25  GLUCOSE 125* 119*  BUN 5* 4*  CREATININE 0.58 0.61  CALCIUM 8.6 8.5    Studies/Results: Ct Abdomen Pelvis W Contrast  08/27/2011  *RADIOLOGY REPORT*  Clinical Data: Status post laparotomy for small bowel obstruction. No symptomatic improvement.  CT ABDOMEN AND PELVIS WITH CONTRAST  Technique:  Multidetector CT imaging of the abdomen and pelvis was performed following the standard protocol during bolus administration of intravenous contrast.  Contrast: 80mL OMNIPAQUE IOHEXOL 300 MG/ML IV SOLN  Comparison: 08/25/2011.  Findings: The previously seen small bowel dilatation is no longer demonstrated.  The previously demonstrated left inguinal or femoral hernia containing a bowel loop is no longer demonstrated. Currently, there are fat-containing lymph nodes in that region.  Interval small amounts of subcutaneous air,  primarily on the left. Interval right rectus abdominus muscle hematoma measuring 4.0 x 1.6 cm in maximum dimensions on image number 46.  Small umbilical hernia containing fat.  Small right renal and right lobe liver cysts.  Vicariously excreted contrast in the gallbladder.  Nasogastric tube tip in the stomach.  Scattered colonic diverticula without evidence of diverticulitis. Diffuse pancreatic atrophy.  Unremarkable adrenal glands and left kidney.  Small splenic calcified granuloma.  Unremarkable urinary bladder.  Surgically absent uterus.  No adnexal masses or enlarged lymph nodes.  Atheromatous arterial calcifications.  Small left pleural effusion and mild left lower lobe atelectasis.  Right superior and inferior pubic ramus fractures with nonunion.  Old right sacral ala fracture deformity with sclerosis.  Lumbar and lower thoracic spine degenerative changes and scoliosis.  IMPRESSION:  1.  Interval repair interval repair of the previously demonstrate left inguinal or femoral hernia. 2.  Resolved small bowel obstruction. 3.  Interval small right rectus abdominus muscle hematoma. 4.  Small umbilical hernia containing fat. 5.  Small left pleural effusion and mild left lower lobe atelectasis. 6.  Colonic diverticulosis.  Original Report Authenticated By: Darrol Angel, M.D.    Assessment: Resolving ileus after SBO and laparoscopy  Plan: Advance to regular diet PO pain Rx OOB, ambulate Leave IV out Likely home tomorrow   Velora Heckler, MD, FACS General & Endocrine Surgery [facility]  08/29/2011

## 2011-08-30 MED ORDER — HYDROCODONE-ACETAMINOPHEN 5-325 MG PO TABS: 1.0000 | ORAL_TABLET | ORAL | Status: AC | PRN

## 2011-08-30 NOTE — Progress Notes (Signed)
Assessment unchanged. Pt and daughter verbalized understanding of DC instructions. Pt discharged via wheelchair to front entrance to meet daughter and awaiting vehicle to carry home. Accompanied by nurse tech.

## 2011-08-30 NOTE — Progress Notes (Addendum)
Patient ID: [name], female   DOB: [birth year], 78 y.o.   MRN: [id number]  General Surgery - [facility] - Progress Note  POD# 5  Subjective: Patient ready to go home.  Tolerated reg diet.  Had BM.  Belly a little puffy.  Objective: Vital signs in last 24 hours: Temp:  [98.7 F (37.1 C)-99.4 F (37.4 C)] 99.4 F (37.4 C) (01/06 0555) Pulse Rate:  [72-77] 77  (01/06 0555) Resp:  [18-20] 20  (01/06 0555) BP: (136-151)/(64-73) 136/73 mmHg (01/06 0555) SpO2:  [96 %-97 %] 96 % (01/06 0555) Last BM Date: 08/29/11  Intake/Output from previous day: 01/05 0701 - 01/06 0700 In: 1010 [P.O.:1010] Out: 500 [Urine:500]  Exam: HEENT - clear, not icteric Neck - soft, no mass Chest - clear bilaterally Cor - RRR, no murmur Abd - soft, wounds clear and dry, positive BS Ext - no significant edema Neuro - grossly intact, no focal deficits  Lab Results:  No results found for this basename: WBC:2,HGB:2,HCT:2,PLT:2 in the last 72 hours   Basename 08/28/11 0400  NA 136  K 3.6  CL 103  CO2 26  GLUCOSE 125*  BUN 5*  CREATININE 0.58  CALCIUM 8.6    Studies/Results: No results found.  Assessment: SBO and laparoscopy by Dr. Biagio Quint - 08/25/2011  Plan: To go home.  D/C instructions reviewed with patient.  D/C dictated: #[id number].  DN  08/30/2011.  D. Ezzard Standing  08/30/2011

## 2011-08-30 NOTE — Discharge Summary (Signed)
[name], [name]                ACCOUNT NO.:  [id number]  MEDICAL RECORD NO.:  [id number]  LOCATION:  1529                         FACILITY:  [facility]  PHYSICIAN:  Sandria Bales. Ezzard Standing, M.D.  DATE OF BIRTH:  [birth year]  DATE OF ADMISSION:  08/25/2011 DATE OF DISCHARGE:  08/30/2011                              DISCHARGE SUMMARY   Date of admission:  August 24, 2011. Date of discharge:  August 30, 2011.   DISCHARGE DIAGNOSES: 1. Small bowel obstruction secondary to incarcerated obturator hernia. 2. History of hypertension. 3. Hypercholesterolemia. 4. History of cardiac valvular insufficiency.  OPERATIONS PERFORMED:  The patient had a diagnostic laparoscopy with reduction of obturator hernia and laparoscopic repair of obturator hernia by Dr. Lodema Pilot on August 25, 2011.  HISTORY OF ILLNESS:  [name] is an 78 year old white female, who was transferred from [facility] for evaluation of a bowel obstruction.  She had a normal colonoscopy just 2 weeks prior to this presentation by Dr. Carman Ching.  She never had any prior bowel obstruction history.  She is followed by Dr. Lora Havens at [facility] from a Cardiology standpoint, Dr. Carman Ching from a Gastroenterology standpoint.  She does not have a primary care physician.  Because of worsening symptoms, she was taken to the operating room by Dr. Lodema Pilot, where she underwent a laparoscopic reduction of obturator hernia and laparoscopic repair of an obturator hernia by Dr. Biagio Quint on August 25, 2011.  Postoperatively, she did well.  She was kept n.p.o. for about 4 days. She had a CT scan, which showed a rectus muscle hematoma and atelectasis.  She is now 5 days postop, is afebrile, eating well, having bowel movements with no specific complaints.  She is ready for discharge.  DISCHARGE INSTRUCTIONS:  Will include Vicodin for pain.  She can resume her home medications.  She should not drive  for 3-4 days.  She will see Dr. Biagio Quint back in 2-3 weeks for followup.   Sandria Bales. Ezzard Standing, M.D., FACS   DHN/MEDQ  D:  08/30/2011  T:  08/30/2011  Job:  [id number]  cc:   Dr. Montez Morita [facility]  Llana Aliment. Malon Kindle., M.D. Fax: [phone]  Lodema Pilot, MD [address] [place], [place] [address]

## 2011-08-31 NOTE — Progress Notes (Signed)
08/30/11, Kathi Der RNC-MNN, BSN, Consult to Case Management cancelled.

## 2011-11-09 ENCOUNTER — Other Ambulatory Visit (HOSPITAL_COMMUNITY): Payer: Self-pay | Admitting: Obstetrics and Gynecology

## 2011-11-09 DIAGNOSIS — M8580 Other specified disorders of bone density and structure, unspecified site: Secondary | ICD-10-CM

## 2011-11-17 ENCOUNTER — Ambulatory Visit (HOSPITAL_COMMUNITY)
Admission: RE | Admit: 2011-11-17 | Discharge: 2011-11-17 | Disposition: A | Discharge status: Home / Self Care | Payer: Medicare Other | Source: Ambulatory Visit | Attending: Obstetrics and Gynecology | Admitting: Obstetrics and Gynecology

## 2011-11-17 DIAGNOSIS — M8580 Other specified disorders of bone density and structure, unspecified site: Secondary | ICD-10-CM

## 2011-11-17 DIAGNOSIS — Z780 Asymptomatic menopausal state: Secondary | ICD-10-CM | POA: Insufficient documentation

## 2011-11-17 DIAGNOSIS — Z13820 Encounter for screening for osteoporosis: Secondary | ICD-10-CM | POA: Insufficient documentation

## 2011-11-21 ENCOUNTER — Encounter (HOSPITAL_BASED_OUTPATIENT_CLINIC_OR_DEPARTMENT_OTHER): Payer: Self-pay | Admitting: *Deleted

## 2011-11-21 ENCOUNTER — Emergency Department (INDEPENDENT_AMBULATORY_CARE_PROVIDER_SITE_OTHER): Payer: Medicare Other

## 2011-11-21 ENCOUNTER — Emergency Department (HOSPITAL_BASED_OUTPATIENT_CLINIC_OR_DEPARTMENT_OTHER)
Admission: EM | Admit: 2011-11-21 | Discharge: 2011-11-21 | Disposition: A | Discharge status: Home / Self Care | Payer: Medicare Other | Attending: Emergency Medicine | Admitting: Emergency Medicine

## 2011-11-21 DIAGNOSIS — I10 Essential (primary) hypertension: Secondary | ICD-10-CM | POA: Insufficient documentation

## 2011-11-21 DIAGNOSIS — M4802 Spinal stenosis, cervical region: Secondary | ICD-10-CM

## 2011-11-21 DIAGNOSIS — M79609 Pain in unspecified limb: Secondary | ICD-10-CM

## 2011-11-21 DIAGNOSIS — Z8739 Personal history of other diseases of the musculoskeletal system and connective tissue: Secondary | ICD-10-CM | POA: Insufficient documentation

## 2011-11-21 DIAGNOSIS — W19XXXA Unspecified fall, initial encounter: Secondary | ICD-10-CM

## 2011-11-21 DIAGNOSIS — M542 Cervicalgia: Secondary | ICD-10-CM

## 2011-11-21 DIAGNOSIS — M533 Sacrococcygeal disorders, not elsewhere classified: Secondary | ICD-10-CM

## 2011-11-21 DIAGNOSIS — IMO0001 Reserved for inherently not codable concepts without codable children: Secondary | ICD-10-CM | POA: Insufficient documentation

## 2011-11-21 DIAGNOSIS — E7800 Pure hypercholesterolemia, unspecified: Secondary | ICD-10-CM | POA: Insufficient documentation

## 2011-11-21 DIAGNOSIS — W108XXA Fall (on) (from) other stairs and steps, initial encounter: Secondary | ICD-10-CM | POA: Insufficient documentation

## 2011-11-21 DIAGNOSIS — M959 Acquired deformity of musculoskeletal system, unspecified: Secondary | ICD-10-CM

## 2011-11-21 DIAGNOSIS — Z79899 Other long term (current) drug therapy: Secondary | ICD-10-CM | POA: Insufficient documentation

## 2011-11-21 HISTORY — DX: Unspecified osteoarthritis, unspecified site: M19.90

## 2011-11-21 HISTORY — DX: Diverticulosis of intestine, part unspecified, without perforation or abscess without bleeding: K57.90

## 2011-11-21 HISTORY — DX: Rheumatic mitral valve disease, unspecified: I05.9

## 2011-11-21 MED ORDER — TETANUS-DIPHTH-ACELL PERTUSSIS 5-2.5-18.5 LF-MCG/0.5 IM SUSP
0.5000 mL | Freq: Once | INTRAMUSCULAR | Status: AC
  Administered 2011-11-21: 0.5 mL via INTRAMUSCULAR
  Filled 2011-11-21: qty 0.5

## 2011-11-21 MED ORDER — ACETAMINOPHEN 325 MG PO TABS
650.0000 mg | ORAL_TABLET | Freq: Once | ORAL | Status: AC
  Administered 2011-11-21: 650 mg via ORAL
  Filled 2011-11-21: qty 2

## 2011-11-21 MED ORDER — HYDROCODONE-ACETAMINOPHEN 5-325 MG PO TABS: 1.0000 | ORAL_TABLET | Freq: Four times a day (QID) | ORAL | Status: AC | PRN

## 2011-11-21 MED ORDER — ACETAMINOPHEN 80 MG PO CHEW
650.0000 mg | CHEWABLE_TABLET | Freq: Once | ORAL | Status: DC
  Filled 2011-11-21: qty 8

## 2011-11-21 NOTE — ED Provider Notes (Signed)
This chart was scribed for Doug Sou, MD by Williemae Natter. The patient was seen in room MH07/MH07 at 6:55 PM.  CSN: [id number]  Arrival date & time 11/21/11  1723   First MD Initiated Contact with Patient 11/21/11 1850      Chief Complaint  Patient presents with  . Fall  . Tailbone Pain    (Consider location/radiation/quality/duration/timing/severity/associated sxs/prior treatment) HPI [name] is a 78 y.o. female who presents to the Emergency Department complaining of a fall. Pt does not use walking aids unless she has an injury. Pt was walking up 2 steps 1 day ago when she lost her footing and fell. Pt has pain in her tailbone that is worse when she puts socks and shoes on. Pt reports that pain is relieved when she does not move.  Pain is moderate to severe, sharp in quality. Treated with aspirin with partial relief Pt has pain in the back of her neck. Pt has been able to walk today. Pt treated pain with extra strength bayer. Pt has not had a bm since the fall. Pt is unsure of when her last tetanus shot was. Pt has hx of a broken kneecap and pelvis. Pt had a surgery for hernia earlier this year. No leg pain Past Medical History  Diagnosis Date  . Hypertension   . High cholesterol   . Arthritis   . Mitral valve disorder   . Diverticulosis     Past Surgical History  Procedure Date  . Abdominal hysterectomy   . Laparoscopy 08/25/2011    Procedure: LAPAROSCOPY DIAGNOSTIC;  Surgeon: Rulon Abide, DO;  Location: [facility];  Service: General;  Laterality: N/A;  reduction obturator hernia obturator hernia repair  . Appendectomy     No family history on file.  History  Substance Use Topics  . Smoking status: Never Smoker   . Smokeless tobacco: Never Used  . Alcohol Use: No    OB History    Grav Para Term Preterm Abortions TAB SAB Ect Mult Living                  Review of Systems  Musculoskeletal: Positive for back pain.       Tailbone pain, neck pain  Skin:        Abrasion right forearm  All other systems reviewed and are negative.    Allergies  Zyrtec  Home Medications   Current Outpatient Rx  Name Route Sig Dispense Refill  . ASPIRIN 500 MG PO TBEC Oral Take 1,000 mg by mouth at bedtime as needed. For pain    . CALCIUM CITRATE-VITAMIN D 315-200 MG-UNIT PO TABS Oral Take 1 tablet by mouth daily.     Marland Kitchen VITAMIN D 2000 UNITS PO CAPS Oral Take 2,000 Units by mouth daily.      . FUROSEMIDE 20 MG PO TABS Oral Take 20 mg by mouth daily.      Marland Kitchen GABAPENTIN 300 MG PO CAPS Oral Take 300-600 mg by mouth 3 (three) times daily. Take one tablet in the morning and at noon. Take two tablet at night    . IBANDRONATE SODIUM 150 MG PO TABS Oral Take 150 mg by mouth every 30 (thirty) days. Take in the morning with a full glass of water, on an empty stomach, and do not take anything else by mouth or lie down for the next 30 min.    Marland Kitchen LISINOPRIL 20 MG PO TABS Oral Take 20 mg by mouth daily.      Marland Kitchen  POLYETHYL GLYCOL-PROPYL GLYCOL 0.4-0.3 % OP SOLN Both Eyes Place 1 drop into both eyes 2 (two) times daily.     Marland Kitchen POLYETHYLENE GLYCOL 3350 PO PACK Oral Take 17 g by mouth daily.      Marland Kitchen SIMVASTATIN 40 MG PO TABS Oral Take 40 mg by mouth at bedtime.        BP 153/86  Pulse 89  Temp(Src) 98.3 F (36.8 C) (Oral)  Resp 20  Wt 127 lb (57.607 kg)  SpO2 99%  Physical Exam  Nursing note and vitals reviewed. Constitutional: She appears well-developed and well-nourished.  HENT:  Head: Normocephalic and atraumatic.  Eyes: Conjunctivae are normal. Pupils are equal, round, and reactive to light.  Neck: Neck supple. No tracheal deviation present. No thyromegaly present.       Tender at cervical spine  Cardiovascular: Normal rate and regular rhythm.   No murmur heard. Pulmonary/Chest: Effort normal and breath sounds normal.  Abdominal: Soft. Bowel sounds are normal. She exhibits no distension. There is no tenderness.  Genitourinary:       Tender on rectal exam but no  gross blood  Musculoskeletal: Normal range of motion. She exhibits tenderness (moderate to severe lumbar tenderness). She exhibits no edema.       Tender cervical spine no tenderness at lumbosacral spine or thoracic spine pelvis stable. Right forearm ecchymotic proximally with corresponding tenderness distal ulnar forearm has a possibly 5 cm abrasion  Neurological: She is alert. Coordination normal.  Skin: Skin is warm and dry. No rash noted.       Abrasion at right distal forearm  ecchymosis at rt forearm  Psychiatric: She has a normal mood and affect.    ED Course  Procedures (including critical care time)  Labs Reviewed - No data to display No results found.   No diagnosis found.  10 PM patient is alert ambulatory without difficulty pain is improved after treatment with Tylenol  MDM  Patient requesting prescription for stronger pain medicine, narcotic pain medicine not given here as she is driving herself home Plan prescription codon-A. Pap Patient to followup with Dr.Ameen having significant pain by next week Diagnosis #1 fall #2 contusions of multiple sites    I personally performed the services described in this documentation, which was scribed in my presence. The recorded information has been reviewed and considered.        Doug Sou, MD 11/21/11 2206

## 2011-11-21 NOTE — ED Notes (Signed)
Pt was assisted with getting dressed, and putting her shoes back on.  Pt also was informed that the MD would be back to discuss the results of her xrays with her when they resulted.

## 2011-11-21 NOTE — Discharge Instructions (Signed)
Take Tylenol for mild pain or the pain medicine prescribed for bad pain. See Dr. Ivory Broad in the office if continuing to have significant pain in 4 or 5 days. Return if her condition worsens for any reason

## 2011-11-21 NOTE — ED Notes (Signed)
Pt reports she fell last night while walking up 2 back steps- c/o "hurts all over"- specifically tailbone, neck, shoulders and right leg pain- abrasions noted to inner right wrist

## 2011-12-04 DIAGNOSIS — M549 Dorsalgia, unspecified: Secondary | ICD-10-CM | POA: Insufficient documentation

## 2011-12-11 ENCOUNTER — Ambulatory Visit
Admission: RE | Admit: 2011-12-11 | Discharge: 2011-12-11 | Disposition: A | Discharge status: Home / Self Care | Payer: Medicare Other | Source: Ambulatory Visit | Attending: Gastroenterology | Admitting: Gastroenterology

## 2011-12-11 ENCOUNTER — Other Ambulatory Visit: Payer: Self-pay | Admitting: Gastroenterology

## 2011-12-11 DIAGNOSIS — R52 Pain, unspecified: Secondary | ICD-10-CM

## 2011-12-23 ENCOUNTER — Ambulatory Visit: Payer: Medicare Other | Attending: Family Medicine | Admitting: Physical Therapy

## 2011-12-23 DIAGNOSIS — M545 Low back pain, unspecified: Secondary | ICD-10-CM | POA: Insufficient documentation

## 2011-12-23 DIAGNOSIS — IMO0001 Reserved for inherently not codable concepts without codable children: Secondary | ICD-10-CM | POA: Insufficient documentation

## 2011-12-23 DIAGNOSIS — M2569 Stiffness of other specified joint, not elsewhere classified: Secondary | ICD-10-CM | POA: Insufficient documentation

## 2011-12-28 ENCOUNTER — Ambulatory Visit: Payer: Medicare Other | Admitting: Rehabilitation

## 2011-12-30 ENCOUNTER — Ambulatory Visit: Payer: Medicare Other | Admitting: Rehabilitation

## 2012-01-01 ENCOUNTER — Encounter: Payer: Medicare Other | Admitting: Rehabilitation

## 2012-01-04 ENCOUNTER — Ambulatory Visit: Payer: Medicare Other | Admitting: Rehabilitation

## 2012-01-06 ENCOUNTER — Ambulatory Visit: Payer: Medicare Other | Admitting: Rehabilitation

## 2012-01-07 ENCOUNTER — Encounter: Payer: Medicare Other | Admitting: Rehabilitation

## 2012-01-07 ENCOUNTER — Other Ambulatory Visit: Payer: Self-pay | Admitting: Gastroenterology

## 2012-01-07 DIAGNOSIS — R1032 Left lower quadrant pain: Secondary | ICD-10-CM

## 2012-01-11 ENCOUNTER — Ambulatory Visit: Payer: Medicare Other | Admitting: Physical Therapy

## 2012-01-12 ENCOUNTER — Ambulatory Visit
Admission: RE | Admit: 2012-01-12 | Discharge: 2012-01-12 | Disposition: A | Discharge status: Home / Self Care | Payer: Medicare Other | Source: Ambulatory Visit | Attending: Gastroenterology | Admitting: Gastroenterology

## 2012-01-12 DIAGNOSIS — R1032 Left lower quadrant pain: Secondary | ICD-10-CM

## 2012-01-12 MED ORDER — IOHEXOL 300 MG/ML  SOLN
100.0000 mL | Freq: Once | INTRAMUSCULAR | Status: AC | PRN
  Administered 2012-01-12: 100 mL via INTRAVENOUS

## 2012-01-13 ENCOUNTER — Ambulatory Visit: Payer: Medicare Other | Admitting: Physical Therapy

## 2012-01-14 ENCOUNTER — Other Ambulatory Visit: Payer: Self-pay | Admitting: Gastroenterology

## 2012-01-14 DIAGNOSIS — R399 Unspecified symptoms and signs involving the genitourinary system: Secondary | ICD-10-CM

## 2012-01-15 ENCOUNTER — Encounter: Payer: Medicare Other | Admitting: Physical Therapy

## 2012-01-19 ENCOUNTER — Ambulatory Visit: Payer: Medicare Other | Admitting: Rehabilitation

## 2012-01-21 ENCOUNTER — Ambulatory Visit: Payer: Medicare Other | Admitting: Rehabilitation

## 2012-01-22 ENCOUNTER — Ambulatory Visit
Admission: RE | Admit: 2012-01-22 | Discharge: 2012-01-22 | Disposition: A | Discharge status: Home / Self Care | Payer: Medicare Other | Source: Ambulatory Visit | Attending: Gastroenterology | Admitting: Gastroenterology

## 2012-01-22 DIAGNOSIS — R399 Unspecified symptoms and signs involving the genitourinary system: Secondary | ICD-10-CM

## 2012-01-22 MED ORDER — GADOBENATE DIMEGLUMINE 529 MG/ML IV SOLN
10.0000 mL | Freq: Once | INTRAVENOUS | Status: AC | PRN
  Administered 2012-01-22: 10 mL via INTRAVENOUS

## 2012-01-25 ENCOUNTER — Ambulatory Visit: Payer: Medicare Other | Attending: Family Medicine | Admitting: Physical Therapy

## 2012-01-25 DIAGNOSIS — M545 Low back pain, unspecified: Secondary | ICD-10-CM | POA: Insufficient documentation

## 2012-01-25 DIAGNOSIS — IMO0001 Reserved for inherently not codable concepts without codable children: Secondary | ICD-10-CM | POA: Insufficient documentation

## 2012-01-25 DIAGNOSIS — M2569 Stiffness of other specified joint, not elsewhere classified: Secondary | ICD-10-CM | POA: Insufficient documentation

## 2012-01-27 ENCOUNTER — Encounter: Payer: Medicare Other | Admitting: Physical Therapy

## 2012-01-28 ENCOUNTER — Ambulatory Visit: Payer: Medicare Other | Admitting: Physical Therapy

## 2012-02-01 ENCOUNTER — Ambulatory Visit: Payer: Medicare Other | Admitting: Physical Therapy

## 2012-02-03 ENCOUNTER — Other Ambulatory Visit (HOSPITAL_BASED_OUTPATIENT_CLINIC_OR_DEPARTMENT_OTHER): Payer: Medicare Other | Admitting: Lab

## 2012-02-03 ENCOUNTER — Ambulatory Visit (HOSPITAL_BASED_OUTPATIENT_CLINIC_OR_DEPARTMENT_OTHER): Payer: Medicare Other | Admitting: Hematology & Oncology

## 2012-02-03 VITALS — BP 117/77 | HR 73 | Temp 97.6°F | Ht 62.0 in | Wt 128.0 lb

## 2012-02-03 DIAGNOSIS — D472 Monoclonal gammopathy: Secondary | ICD-10-CM

## 2012-02-03 DIAGNOSIS — D649 Anemia, unspecified: Secondary | ICD-10-CM

## 2012-02-03 DIAGNOSIS — R5381 Other malaise: Secondary | ICD-10-CM

## 2012-02-03 LAB — CBC WITH DIFFERENTIAL (CANCER CENTER ONLY)
BASO%: 0.6 % (ref 0.0–2.0)
EOS%: 2 % (ref 0.0–7.0)
HCT: 36.1 % (ref 34.8–46.6)
LYMPH#: 1.4 10*3/uL (ref 0.9–3.3)
LYMPH%: 19.9 % (ref 14.0–48.0)
MCH: 32.4 pg (ref 26.0–34.0)
MCHC: 33.5 g/dL (ref 32.0–36.0)
MONO%: 11.9 % (ref 0.0–13.0)
NEUT%: 65.6 % (ref 39.6–80.0)
RDW: 13.5 % (ref 11.1–15.7)

## 2012-02-03 NOTE — Progress Notes (Signed)
This office note has been dictated.

## 2012-02-04 ENCOUNTER — Ambulatory Visit: Payer: Medicare Other | Admitting: Physical Therapy

## 2012-02-04 NOTE — Progress Notes (Signed)
CC:   Tina Nations, MD  DIAGNOSIS:  IgG kappa MGUS.  CURRENT THERAPY:  Observation.  INTERVAL HISTORY:  [name] comes in for followup.  We see her every 6 months.  So far, we have not found any obvious issues with respect to her MGUS.  There has been no evidence of progression.  She had her myeloma panel done back in December.  Her monoclonal spike was 0.44 g/dL.  Her IgG level was 972 mg/dL.  Her kappa light chain was 2.85 mg/dL.  She last had x-rays done back in March and April.  She had a fall. There is no evidence of lytic lesions.  There is no fracture with respect to the pelvis.  She did have a post traumatic deformity with the pelvis.  Her right forearm also showed no evidence of fracture or lytic lesions.  The ribs were x-rayed in April.  Again, nothing was seen with ribs to suggest lytic lesions.  She does feel tired.  I am not sure as to why she would feel tired. She has had no bleeding.  She has had a decent appetite.  She has this chronic abdominal pain.  Again, not sure as to what is going on with the abdominal pain.  She had a CT scan done of the abdomen in May.  Again, the CT scan showed nothing that looked suspicious.  There was, however, an "enlarging" right renal lesion. There was a 1.7 mm lesion in the right kidney.  This measured 1.2 mm back in 2009.  Of course, the radiologist recommended an MRI. Therefore, an MRI was done.  An MRI, thankfully, did not show this lesion to be anything but a cyst.  PHYSICAL EXAM:  General: This is an elderly, but fairly well-nourished white female in no obvious distress.  Vital signs:  Temperature of 97.9, pulse 73, respiratory 18, blood pressure 117/77.  Weight is 128.  Head and neck exam:  Shows a normocephalic, atraumatic skull.  There are no ocular or oral lesions.  There are no palpable cervical or supraclavicular lymph nodes.  Lungs:  Clear bilaterally.  Cardiac: Regular rate and rhythm with a normal S1 and S2.  There  are no murmurs, rubs or bruits.  Abdomen:  Soft with good bowel sounds.  There is no palpable abdominal mass.  There is no fluid wave.  There is no palpable hepatosplenomegaly.  Back exam:  No tenderness over the spine, ribs, or hips.  There is no kyphosis.  Extremities:  Show no clubbing, cyanosis or edema.  Neurological exam:  Shows no focal neurological deficits.  LABORATORY STUDIES:  White cell count is 7, hemoglobin 12.1, hematocrit 36.1, platelet count 221.  IMPRESSION:  [name] is an 78 year old white female with an IgG kappa MGUS.  So far, I see no evidence of this being problematic.  She has had multiple x-rays and studies done this year.  None of the x-ray studies showed lytic lesions that would suggest actual myeloma.  I am not sure as to why she has this fatigue.  We will go ahead and plan to get her back to see [place] in 3 months.  I may check her iron studies and see what that looks like.    ______________________________ Josph Macho, M.D. PRE/MEDQ  D:  02/03/2012  T:  02/04/2012  Job:  [id number]

## 2012-02-05 LAB — COMPREHENSIVE METABOLIC PANEL
ALT: 14 U/L (ref 0–35)
AST: 25 U/L (ref 0–37)
Alkaline Phosphatase: 66 U/L (ref 39–117)
BUN: 21 mg/dL (ref 6–23)
Chloride: 102 mEq/L (ref 96–112)
Creatinine, Ser: 0.74 mg/dL (ref 0.50–1.10)
Total Bilirubin: 0.3 mg/dL (ref 0.3–1.2)

## 2012-02-05 LAB — IGG, IGA, IGM: IgM, Serum: 277 mg/dL (ref 52–322)

## 2012-02-05 LAB — KAPPA/LAMBDA LIGHT CHAINS
Kappa free light chain: 2.12 mg/dL — ABNORMAL HIGH (ref 0.33–1.94)
Kappa:Lambda Ratio: 0.96 (ref 0.26–1.65)
Lambda Free Lght Chn: 2.2 mg/dL (ref 0.57–2.63)

## 2012-02-05 LAB — PROTEIN ELECTROPHORESIS, SERUM, WITH REFLEX
Albumin ELP: 58.7 % (ref 55.8–66.1)
M-Spike, %: 0.42 g/dL
Total Protein, Serum Electrophoresis: 6.6 g/dL (ref 6.0–8.3)

## 2012-02-09 ENCOUNTER — Ambulatory Visit: Payer: Medicare Other | Admitting: Physical Therapy

## 2012-02-09 ENCOUNTER — Telehealth: Payer: Self-pay | Admitting: Hematology & Oncology

## 2012-02-09 NOTE — Telephone Encounter (Addendum)
Message copied by Cathi Roan on Tue Feb 09, 2012  3:08 PM ------      Message from: Josph Macho      Created: Sun Feb 07, 2012  8:49 PM       Call - labs are stable.  Cindee Lame  02-09-12 @ 3:08 pm, called patient on home phone and left message regarding above lab results from MD, also gave phone # for patient to call back if needed. ST

## 2012-02-11 ENCOUNTER — Ambulatory Visit: Payer: Medicare Other | Admitting: Physical Therapy

## 2012-02-15 ENCOUNTER — Ambulatory Visit: Payer: Medicare Other | Admitting: Physical Therapy

## 2012-02-18 ENCOUNTER — Ambulatory Visit: Payer: Medicare Other | Admitting: Physical Therapy

## 2012-03-01 ENCOUNTER — Ambulatory Visit: Payer: Medicare Other | Admitting: Physical Therapy

## 2012-03-03 ENCOUNTER — Ambulatory Visit: Payer: Medicare Other | Admitting: Physical Therapy

## 2012-03-07 ENCOUNTER — Ambulatory Visit: Payer: Medicare Other | Admitting: Physical Therapy

## 2012-03-10 ENCOUNTER — Ambulatory Visit: Payer: Medicare Other | Admitting: Physical Therapy

## 2012-04-28 ENCOUNTER — Other Ambulatory Visit: Payer: Self-pay | Admitting: Rheumatology

## 2012-04-28 DIAGNOSIS — M25512 Pain in left shoulder: Secondary | ICD-10-CM

## 2012-05-05 ENCOUNTER — Ambulatory Visit
Admission: RE | Admit: 2012-05-05 | Discharge: 2012-05-05 | Disposition: A | Discharge status: Home / Self Care | Payer: Medicare Other | Source: Ambulatory Visit | Attending: Rheumatology | Admitting: Rheumatology

## 2012-05-05 DIAGNOSIS — M25512 Pain in left shoulder: Secondary | ICD-10-CM

## 2012-05-06 ENCOUNTER — Ambulatory Visit (HOSPITAL_BASED_OUTPATIENT_CLINIC_OR_DEPARTMENT_OTHER): Payer: Medicare Other | Admitting: Medical

## 2012-05-06 ENCOUNTER — Other Ambulatory Visit (HOSPITAL_BASED_OUTPATIENT_CLINIC_OR_DEPARTMENT_OTHER): Payer: Medicare Other | Admitting: Lab

## 2012-05-06 VITALS — BP 126/56 | HR 66 | Temp 98.2°F | Resp 18 | Ht 62.0 in | Wt 123.0 lb

## 2012-05-06 DIAGNOSIS — M810 Age-related osteoporosis without current pathological fracture: Secondary | ICD-10-CM

## 2012-05-06 DIAGNOSIS — D472 Monoclonal gammopathy: Secondary | ICD-10-CM

## 2012-05-06 DIAGNOSIS — D649 Anemia, unspecified: Secondary | ICD-10-CM

## 2012-05-06 LAB — CBC WITH DIFFERENTIAL (CANCER CENTER ONLY)
Eosinophils Absolute: 0.1 10*3/uL (ref 0.0–0.5)
HCT: 35.6 % (ref 34.8–46.6)
LYMPH%: 17.9 % (ref 14.0–48.0)
MCH: 33.2 pg (ref 26.0–34.0)
MCV: 98 fL (ref 81–101)
MONO#: 0.9 10*3/uL (ref 0.1–0.9)
MONO%: 12.4 % (ref 0.0–13.0)
NEUT%: 67.6 % (ref 39.6–80.0)
Platelets: 157 10*3/uL (ref 145–400)
RDW: 13.3 % (ref 11.1–15.7)
WBC: 7.1 10*3/uL (ref 3.9–10.0)

## 2012-05-06 NOTE — Progress Notes (Signed)
Diagnosis: IgG kappa MGUS.  Current therapy: Observation  Interim history: [name] presents today for an office followup visit.  Overall, she, reports, that she's been doing relatively well except for her osteoporosis.  She is currently on Forteo.  Her myeloma panel.  That was done back in June, revealed an M spike of 0.4, 2 g/dL, her IgG level was 4098 mg/dL, and her kappa light chain was 2.1, 2 mg/dL. Marland Kitchen  There has not been any obvious issues with respect to her MGUS.  There has been no evidence of progression.  She reports, that she has a good appetite.  She denies any nausea, vomiting, diarrhea, or constipation.  She denies any new abdominal pain.  She denies any cough, chest pain, or shortness of breath.  She denies any fevers, chills, or night sweats.  She denies any type of unintentional weight loss.  She denies any obvious, or abnormal bleeding.  She denies any headaches, visual changes, or rashes.  Review of Systems: Pt. Denies any changes in their vision, hearing, adenopathy, fevers, chills, nausea, vomiting, diarrhea, constipation, chest pain, shortness of breath, passing blood, passing out, blacking out,  any changes in skin, joints, neurologic or psychiatric except as noted.  Physical Exam: This is an 78 year old, fairly, well-nourished, white female, in no obvious distress Vitals: Temperature 90.2 degrees, pulse 66, respirations 18, blood pressure 126/58, weight 123 pounds HEENT reveals a normocephalic, atraumatic skull, no scleral icterus, no oral lesions  Neck is supple without any cervical or supraclavicular adenopathy.  Lungs are clear to auscultation bilaterally. There are no wheezes, rales or rhonci Cardiac is regular rate and rhythm with a normal S1 and S2. There are no murmurs, rubs, or bruits.  Abdomen is soft with good bowel sounds, there is no palpable mass. There is no palpable hepatosplenomegaly. There is no palpable fluid wave.  Musculoskeletal no tenderness of the  spine, ribs, or hips.  Extremities there are no clubbing, cyanosis, or edema.  Skin no petechia, purpura or ecchymosis Neurologic is nonfocal.  Laboratory Data: White count 7.1, hemoglobin 12.1, hematocrit 35.6, platelets 157,000  Current Outpatient Prescriptions on File Prior to Visit  Medication Sig Dispense Refill  . aspirin 500 MG EC tablet Take 1,000 mg by mouth at bedtime as needed. For pain      . calcium citrate-vitamin D (CITRACAL+D) 315-200 MG-UNIT per tablet Take 1 tablet by mouth daily.       . Cholecalciferol (VITAMIN D) 2000 UNITS CAPS Take 2,000 Units by mouth daily.        . furosemide (LASIX) 20 MG tablet Take 20 mg by mouth daily.        Marland Kitchen gabapentin (NEURONTIN) 300 MG capsule Take 300-600 mg by mouth 3 (three) times daily. Take one tablet in the morning and at noon. Take two tablet at night      . lisinopril (PRINIVIL,ZESTRIL) 20 MG tablet Take 20 mg by mouth daily.        . pantoprazole (PROTONIX) 40 MG tablet       . Polyethyl Glycol-Propyl Glycol (SYSTANE ULTRA PF) 0.4-0.3 % SOLN Place 1 drop into both eyes 2 (two) times daily.       . polyethylene glycol (MIRALAX / GLYCOLAX) packet Take 17 g by mouth daily.        . simvastatin (ZOCOR) 40 MG tablet Take 40 mg by mouth at bedtime.         Assessment/Plan: This is a pleasant, 78 year old, elderly, female, with the following issues:  #  1 IgG kappa MGUS.  So far, there is been no evidence of this being problematic.  She did have some x-ray studies done early in 2013.  That did not show any lytic lesions that would suggest actual myeloma.  We will continue to monitor her cer closely.  #2 followup we will plan on seeing [name].  Back in March, but before then should there be questions or concerns.

## 2012-05-09 ENCOUNTER — Telehealth: Payer: Self-pay | Admitting: *Deleted

## 2012-05-09 NOTE — Telephone Encounter (Signed)
Called patient to let her know that her labwork was all good per dr. Myna Hidalgo.  Left this message on personal phone

## 2012-05-09 NOTE — Telephone Encounter (Signed)
Message copied by Anselm Jungling on Mon May 09, 2012  9:35 AM ------      Message from: Josph Macho      Created: Sat May 07, 2012 11:53 AM       Call - labs look great!!!  Cindee Lame

## 2012-05-10 ENCOUNTER — Other Ambulatory Visit: Payer: Medicare Other

## 2012-05-10 LAB — PROTEIN ELECTROPHORESIS, SERUM, WITH REFLEX
Albumin ELP: 59.2 % (ref 55.8–66.1)
Alpha-1-Globulin: 4.8 % (ref 2.9–4.9)
Total Protein, Serum Electrophoresis: 6.3 g/dL (ref 6.0–8.3)

## 2012-05-10 LAB — FERRITIN: Ferritin: 30 ng/mL (ref 10–291)

## 2012-05-10 LAB — KAPPA/LAMBDA LIGHT CHAINS
Kappa free light chain: 1.69 mg/dL (ref 0.33–1.94)
Lambda Free Lght Chn: 2.38 mg/dL (ref 0.57–2.63)

## 2012-05-10 LAB — RETICULOCYTES (CHCC)
ABS Retic: 41.9 10*3/uL (ref 19.0–186.0)
RBC.: 3.81 MIL/uL — ABNORMAL LOW (ref 3.87–5.11)
Retic Ct Pct: 1.1 % (ref 0.4–2.3)

## 2012-05-10 LAB — IRON AND TIBC
Iron: 84 ug/dL (ref 42–145)
UIBC: 249 ug/dL (ref 125–400)

## 2012-05-10 LAB — IGG, IGA, IGM: IgG (Immunoglobin G), Serum: 806 mg/dL (ref 690–1700)

## 2012-05-18 ENCOUNTER — Telehealth: Payer: Self-pay | Admitting: *Deleted

## 2012-05-18 NOTE — Telephone Encounter (Signed)
Pt called requesting that her labs be faxed to [place] at [facility] [phone]. They need to know what her her "levels" are. She seemed to be most concerned about her calcium levels but wasn't entirely sure. Faxed labs from her last 2 visits along with the cooresponding office notes. She is having a procedure done with "injections".

## 2012-05-19 ENCOUNTER — Telehealth: Payer: Self-pay | Admitting: Hematology & Oncology

## 2012-05-19 NOTE — Telephone Encounter (Signed)
Faxed labs to [facility] for continuity of care. [phone]

## 2012-06-15 ENCOUNTER — Encounter (HOSPITAL_COMMUNITY)
Admission: RE | Admit: 2012-06-15 | Discharge: 2012-06-15 | Disposition: A | Discharge status: Home / Self Care | Payer: Medicare Other | Source: Ambulatory Visit | Attending: Orthopedic Surgery | Admitting: Orthopedic Surgery

## 2012-06-15 ENCOUNTER — Encounter (HOSPITAL_COMMUNITY): Payer: Self-pay

## 2012-06-15 HISTORY — DX: Disease of blood and blood-forming organs, unspecified: D75.9

## 2012-06-15 HISTORY — DX: Gastro-esophageal reflux disease without esophagitis: K21.9

## 2012-06-15 LAB — BASIC METABOLIC PANEL
BUN: 20 mg/dL (ref 6–23)
CO2: 28 mEq/L (ref 19–32)
Calcium: 9.6 mg/dL (ref 8.4–10.5)
Chloride: 99 mEq/L (ref 96–112)
Creatinine, Ser: 0.74 mg/dL (ref 0.50–1.10)

## 2012-06-15 LAB — SURGICAL PCR SCREEN: MRSA, PCR: NEGATIVE

## 2012-06-15 LAB — CBC
HCT: 37.8 % (ref 36.0–46.0)
MCHC: 33.3 g/dL (ref 30.0–36.0)
MCV: 96.7 fL (ref 78.0–100.0)
Platelets: 278 10*3/uL (ref 150–400)
RDW: 13.4 % (ref 11.5–15.5)

## 2012-06-15 MED ORDER — CHLORHEXIDINE GLUCONATE 4 % EX LIQD: 60.0000 mL | Freq: Once | CUTANEOUS | Status: DC

## 2012-06-15 NOTE — Pre-Procedure Instructions (Signed)
20 FAYLYNN STAMOS  06/15/2012   Your procedure is scheduled on:  06-23-2012  Report to [facility] at 5:30 AM.  Call this number if you have problems the morning of surgery: [phone]   Remember:   Do not eat food or drink:After Midnight.    .  Take these medicines the morning of surgery with A SIP OF WATER: protonix(pantoprazole)              STOP ASPIRIN AND ASPIRIN PRODUCTS)ALEVE,IBUPROFEN),ALL VITAMINS AND SUPPLEMENTS             5 DAYS BEFORE SURGERY.   Do not wear jewelry, make-up or nail polish.  Do not wear lotions, powders, or perfumes. You may wear deodorant.  Do not shave 48 hours prior to surgery.   Do not bring valuables to the hospital.  Contacts, dentures or bridgework may not be worn into surgery.  Leave suitcase in the car. After surgery it may be brought to your room.   For patients admitted to the hospital, checkout time is 11:00 AM the day of discharge.  Marland Kitchen   Special Instructions: Incentive Spirometry - Practice and bring it with you on the day of surgery. Shower using CHG 2 nights before surgery and the night before surgery.  If you shower the day of surgery use CHG.  Use special wash - you have one bottle of CHG for all showers.  You should use approximately 1/3 of the bottle for each shower.     Please read over the following fact sheets that you were given: Pain Booklet, Coughing and Deep Breathing, Blood Transfusion Information, MRSA Information and Surgical Site Infection Prevention

## 2012-06-15 NOTE — Progress Notes (Signed)
Echo requested from [facility].  OV<EKG requested from Dr. Verl Dicker office

## 2012-06-22 MED ORDER — LACTATED RINGERS IV SOLN: INTRAVENOUS | Status: DC

## 2012-06-22 MED ORDER — CEFAZOLIN SODIUM-DEXTROSE 2-3 GM-% IV SOLR
2.0000 g | INTRAVENOUS | Status: AC
  Administered 2012-06-23: 2 g via INTRAVENOUS
  Filled 2012-06-22: qty 50

## 2012-06-23 ENCOUNTER — Encounter (HOSPITAL_COMMUNITY)
Admission: RE | Disposition: A | Discharge status: Skilled Nursing Facility | Payer: Self-pay | Source: Ambulatory Visit | Attending: Orthopedic Surgery

## 2012-06-23 ENCOUNTER — Encounter (HOSPITAL_COMMUNITY): Payer: Self-pay | Admitting: *Deleted

## 2012-06-23 ENCOUNTER — Encounter (HOSPITAL_COMMUNITY): Payer: Self-pay | Admitting: General Practice

## 2012-06-23 ENCOUNTER — Encounter (HOSPITAL_COMMUNITY): Payer: Self-pay | Admitting: Anesthesiology

## 2012-06-23 ENCOUNTER — Inpatient Hospital Stay (HOSPITAL_COMMUNITY)
Admission: RE | Admit: 2012-06-23 | Discharge: 2012-06-27 | DRG: 484 | Disposition: A | Discharge status: Skilled Nursing Facility | Payer: Medicare Other | Source: Ambulatory Visit | Attending: Orthopedic Surgery | Admitting: Orthopedic Surgery

## 2012-06-23 ENCOUNTER — Inpatient Hospital Stay (HOSPITAL_COMMUNITY): Payer: Medicare Other | Admitting: Anesthesiology

## 2012-06-23 DIAGNOSIS — K219 Gastro-esophageal reflux disease without esophagitis: Secondary | ICD-10-CM | POA: Diagnosis present

## 2012-06-23 DIAGNOSIS — M19019 Primary osteoarthritis, unspecified shoulder: Principal | ICD-10-CM | POA: Diagnosis present

## 2012-06-23 DIAGNOSIS — Z8719 Personal history of other diseases of the digestive system: Secondary | ICD-10-CM

## 2012-06-23 DIAGNOSIS — Z7982 Long term (current) use of aspirin: Secondary | ICD-10-CM

## 2012-06-23 DIAGNOSIS — I10 Essential (primary) hypertension: Secondary | ICD-10-CM | POA: Diagnosis present

## 2012-06-23 DIAGNOSIS — Z9089 Acquired absence of other organs: Secondary | ICD-10-CM

## 2012-06-23 DIAGNOSIS — Z79899 Other long term (current) drug therapy: Secondary | ICD-10-CM

## 2012-06-23 DIAGNOSIS — I059 Rheumatic mitral valve disease, unspecified: Secondary | ICD-10-CM | POA: Diagnosis present

## 2012-06-23 DIAGNOSIS — M129 Arthropathy, unspecified: Secondary | ICD-10-CM | POA: Diagnosis present

## 2012-06-23 DIAGNOSIS — M19012 Primary osteoarthritis, left shoulder: Secondary | ICD-10-CM | POA: Diagnosis present

## 2012-06-23 DIAGNOSIS — Z23 Encounter for immunization: Secondary | ICD-10-CM

## 2012-06-23 DIAGNOSIS — D472 Monoclonal gammopathy: Secondary | ICD-10-CM

## 2012-06-23 DIAGNOSIS — Z9181 History of falling: Secondary | ICD-10-CM

## 2012-06-23 DIAGNOSIS — Z888 Allergy status to other drugs, medicaments and biological substances status: Secondary | ICD-10-CM

## 2012-06-23 DIAGNOSIS — E7800 Pure hypercholesterolemia, unspecified: Secondary | ICD-10-CM | POA: Diagnosis present

## 2012-06-23 DIAGNOSIS — Z90710 Acquired absence of both cervix and uterus: Secondary | ICD-10-CM

## 2012-06-23 HISTORY — DX: Unspecified fall, initial encounter: W19.XXXA

## 2012-06-23 HISTORY — DX: Unspecified osteoarthritis, unspecified site: M19.90

## 2012-06-23 HISTORY — DX: Unspecified place in unspecified non-institutional (private) residence as the place of occurrence of the external cause: Y92.009

## 2012-06-23 HISTORY — PX: TOTAL SHOULDER ARTHROPLASTY: SHX126

## 2012-06-23 SURGERY — ARTHROPLASTY, SHOULDER, TOTAL
Anesthesia: General | Site: Shoulder | Laterality: Left | Wound class: Clean

## 2012-06-23 MED ORDER — LACTATED RINGERS IV SOLN
INTRAVENOUS | Status: DC | PRN
  Administered 2012-06-23 (×2): via INTRAVENOUS

## 2012-06-23 MED ORDER — MENTHOL 3 MG MT LOZG: 1.0000 | LOZENGE | OROMUCOSAL | Status: DC | PRN

## 2012-06-23 MED ORDER — FUROSEMIDE 20 MG PO TABS
20.0000 mg | ORAL_TABLET | Freq: Every day | ORAL | Status: DC
  Administered 2012-06-23 – 2012-06-27 (×4): 20 mg via ORAL
  Filled 2012-06-23 (×5): qty 1

## 2012-06-23 MED ORDER — HYDROMORPHONE HCL PF 1 MG/ML IJ SOLN
0.5000 mg | INTRAMUSCULAR | Status: DC | PRN
  Administered 2012-06-23 – 2012-06-24 (×2): 1 mg via INTRAVENOUS
  Filled 2012-06-23 (×2): qty 1

## 2012-06-23 MED ORDER — OXYCODONE HCL 5 MG/5ML PO SOLN: 5.0000 mg | Freq: Once | ORAL | Status: DC | PRN

## 2012-06-23 MED ORDER — GABAPENTIN 600 MG PO TABS
600.0000 mg | ORAL_TABLET | Freq: Every day | ORAL | Status: DC
  Administered 2012-06-23 – 2012-06-26 (×4): 600 mg via ORAL
  Filled 2012-06-23 (×5): qty 1

## 2012-06-23 MED ORDER — OXYCODONE-ACETAMINOPHEN 5-325 MG PO TABS
1.0000 | ORAL_TABLET | ORAL | Status: DC | PRN
  Administered 2012-06-24: 1 via ORAL
  Administered 2012-06-25 – 2012-06-27 (×8): 2 via ORAL
  Filled 2012-06-23 (×7): qty 2
  Filled 2012-06-23: qty 1
  Filled 2012-06-23 (×2): qty 2

## 2012-06-23 MED ORDER — LIDOCAINE HCL (CARDIAC) 20 MG/ML IV SOLN
INTRAVENOUS | Status: DC | PRN
  Administered 2012-06-23: 20 mg via INTRAVENOUS

## 2012-06-23 MED ORDER — FENTANYL CITRATE 0.05 MG/ML IJ SOLN
INTRAMUSCULAR | Status: DC | PRN
  Administered 2012-06-23: 200 ug via INTRAVENOUS
  Administered 2012-06-23: 50 ug via INTRAVENOUS

## 2012-06-23 MED ORDER — POLYETHYLENE GLYCOL 3350 17 G PO PACK
17.0000 g | PACK | Freq: Every day | ORAL | Status: DC
  Administered 2012-06-23 – 2012-06-27 (×5): 17 g via ORAL
  Filled 2012-06-23 (×6): qty 1

## 2012-06-23 MED ORDER — ONDANSETRON HCL 4 MG/2ML IJ SOLN: 4.0000 mg | Freq: Four times a day (QID) | INTRAMUSCULAR | Status: DC | PRN

## 2012-06-23 MED ORDER — ALUM & MAG HYDROXIDE-SIMETH 200-200-20 MG/5ML PO SUSP: 30.0000 mL | ORAL | Status: DC | PRN

## 2012-06-23 MED ORDER — GABAPENTIN 300 MG PO CAPS: 300.0000 mg | ORAL_CAPSULE | Freq: Three times a day (TID) | ORAL | Status: DC

## 2012-06-23 MED ORDER — PANTOPRAZOLE SODIUM 40 MG PO TBEC
40.0000 mg | DELAYED_RELEASE_TABLET | Freq: Every day | ORAL | Status: DC
  Administered 2012-06-24 – 2012-06-27 (×4): 40 mg via ORAL
  Filled 2012-06-23 (×3): qty 1

## 2012-06-23 MED ORDER — ROCURONIUM BROMIDE 100 MG/10ML IV SOLN
INTRAVENOUS | Status: DC | PRN
  Administered 2012-06-23: 50 mg via INTRAVENOUS

## 2012-06-23 MED ORDER — PROPOFOL 10 MG/ML IV BOLUS
INTRAVENOUS | Status: DC | PRN
  Administered 2012-06-23: 100 mg via INTRAVENOUS

## 2012-06-23 MED ORDER — HYDROMORPHONE HCL PF 1 MG/ML IJ SOLN: 0.2500 mg | INTRAMUSCULAR | Status: DC | PRN

## 2012-06-23 MED ORDER — ONDANSETRON HCL 4 MG/2ML IJ SOLN
INTRAMUSCULAR | Status: DC | PRN
  Administered 2012-06-23: 4 mg via INTRAVENOUS

## 2012-06-23 MED ORDER — ACETAMINOPHEN 10 MG/ML IV SOLN
INTRAVENOUS | Status: AC
  Filled 2012-06-23: qty 100

## 2012-06-23 MED ORDER — OXYCODONE HCL 5 MG PO TABS: 5.0000 mg | ORAL_TABLET | Freq: Once | ORAL | Status: DC | PRN

## 2012-06-23 MED ORDER — ACETAMINOPHEN 650 MG RE SUPP: 650.0000 mg | Freq: Four times a day (QID) | RECTAL | Status: DC | PRN

## 2012-06-23 MED ORDER — MIDAZOLAM HCL 2 MG/2ML IJ SOLN: 0.5000 mg | Freq: Once | INTRAMUSCULAR | Status: DC | PRN

## 2012-06-23 MED ORDER — MIDAZOLAM HCL 5 MG/5ML IJ SOLN
INTRAMUSCULAR | Status: DC | PRN
  Administered 2012-06-23 (×2): 1 mg via INTRAVENOUS

## 2012-06-23 MED ORDER — PROMETHAZINE HCL 25 MG/ML IJ SOLN: 6.2500 mg | INTRAMUSCULAR | Status: DC | PRN

## 2012-06-23 MED ORDER — KETOROLAC TROMETHAMINE 15 MG/ML IJ SOLN
15.0000 mg | Freq: Four times a day (QID) | INTRAMUSCULAR | Status: AC
  Administered 2012-06-23 – 2012-06-25 (×10): 15 mg via INTRAVENOUS
  Filled 2012-06-23 (×12): qty 1

## 2012-06-23 MED ORDER — ARTIFICIAL TEARS OP OINT
TOPICAL_OINTMENT | OPHTHALMIC | Status: DC | PRN
  Administered 2012-06-23: 1 via OPHTHALMIC

## 2012-06-23 MED ORDER — METOCLOPRAMIDE HCL 10 MG PO TABS
5.0000 mg | ORAL_TABLET | Freq: Three times a day (TID) | ORAL | Status: DC | PRN
Start: 2012-06-23 — End: 2012-06-27

## 2012-06-23 MED ORDER — ACETAMINOPHEN 325 MG PO TABS
650.0000 mg | ORAL_TABLET | Freq: Four times a day (QID) | ORAL | Status: DC | PRN
  Administered 2012-06-24: 650 mg via ORAL
  Filled 2012-06-23: qty 2

## 2012-06-23 MED ORDER — ACETAMINOPHEN 10 MG/ML IV SOLN
1000.0000 mg | Freq: Once | INTRAVENOUS | Status: AC
  Administered 2012-06-23: 1000 mg via INTRAVENOUS
  Filled 2012-06-23: qty 100

## 2012-06-23 MED ORDER — PHENYLEPHRINE HCL 10 MG/ML IJ SOLN
INTRAMUSCULAR | Status: DC | PRN
  Administered 2012-06-23: 160 ug via INTRAVENOUS
  Administered 2012-06-23: 80 ug via INTRAVENOUS

## 2012-06-23 MED ORDER — BUPIVACAINE-EPINEPHRINE PF 0.5-1:200000 % IJ SOLN
INTRAMUSCULAR | Status: DC | PRN
  Administered 2012-06-23: 25 mL

## 2012-06-23 MED ORDER — SIMVASTATIN 40 MG PO TABS
40.0000 mg | ORAL_TABLET | Freq: Every day | ORAL | Status: DC
  Administered 2012-06-23 – 2012-06-26 (×4): 40 mg via ORAL
  Filled 2012-06-23 (×5): qty 1

## 2012-06-23 MED ORDER — TRAMADOL HCL 50 MG PO TABS
50.0000 mg | ORAL_TABLET | Freq: Four times a day (QID) | ORAL | Status: DC | PRN
  Administered 2012-06-26: 50 mg via ORAL
  Filled 2012-06-23: qty 1

## 2012-06-23 MED ORDER — LISINOPRIL 20 MG PO TABS
20.0000 mg | ORAL_TABLET | Freq: Every day | ORAL | Status: DC
  Administered 2012-06-23 – 2012-06-27 (×4): 20 mg via ORAL
  Filled 2012-06-23 (×5): qty 1

## 2012-06-23 MED ORDER — INFLUENZA VIRUS VACC SPLIT PF IM SUSP
0.5000 mL | INTRAMUSCULAR | Status: AC
  Administered 2012-06-24: 0.5 mL via INTRAMUSCULAR
  Filled 2012-06-23: qty 0.5

## 2012-06-23 MED ORDER — DOCUSATE SODIUM 100 MG PO CAPS
100.0000 mg | ORAL_CAPSULE | Freq: Two times a day (BID) | ORAL | Status: DC
  Administered 2012-06-23 – 2012-06-27 (×8): 100 mg via ORAL
  Filled 2012-06-23 (×10): qty 1

## 2012-06-23 MED ORDER — METOCLOPRAMIDE HCL 5 MG/ML IJ SOLN: 5.0000 mg | Freq: Three times a day (TID) | INTRAMUSCULAR | Status: DC | PRN

## 2012-06-23 MED ORDER — BISACODYL 10 MG RE SUPP: 10.0000 mg | Freq: Every day | RECTAL | Status: DC | PRN

## 2012-06-23 MED ORDER — LACTATED RINGERS IV SOLN
INTRAVENOUS | Status: DC
  Administered 2012-06-23 – 2012-06-24 (×3): via INTRAVENOUS

## 2012-06-23 MED ORDER — PHENYLEPHRINE HCL 10 MG/ML IJ SOLN
10.0000 mg | INTRAVENOUS | Status: DC | PRN
  Administered 2012-06-23: 20 ug/min via INTRAVENOUS

## 2012-06-23 MED ORDER — MEPERIDINE HCL 25 MG/ML IJ SOLN: 6.2500 mg | INTRAMUSCULAR | Status: DC | PRN

## 2012-06-23 MED ORDER — PHENOL 1.4 % MT LIQD
1.0000 | OROMUCOSAL | Status: DC | PRN
  Administered 2012-06-25: 1 via OROMUCOSAL
  Filled 2012-06-23: qty 177

## 2012-06-23 MED ORDER — ACETAMINOPHEN 10 MG/ML IV SOLN
INTRAVENOUS | Status: DC | PRN
  Administered 2012-06-23: 1000 mg via INTRAVENOUS

## 2012-06-23 MED ORDER — FLEET ENEMA 7-19 GM/118ML RE ENEM: 1.0000 | ENEMA | Freq: Once | RECTAL | Status: AC | PRN

## 2012-06-23 MED ORDER — GABAPENTIN 300 MG PO CAPS
300.0000 mg | ORAL_CAPSULE | Freq: Two times a day (BID) | ORAL | Status: DC
  Administered 2012-06-23 – 2012-06-27 (×8): 300 mg via ORAL
  Filled 2012-06-23 (×9): qty 1

## 2012-06-23 MED ORDER — ONDANSETRON HCL 4 MG PO TABS: 4.0000 mg | ORAL_TABLET | Freq: Four times a day (QID) | ORAL | Status: DC | PRN

## 2012-06-23 MED ORDER — TEMAZEPAM 15 MG PO CAPS: 15.0000 mg | ORAL_CAPSULE | Freq: Every evening | ORAL | Status: DC | PRN

## 2012-06-23 MED ORDER — SODIUM CHLORIDE 0.9 % IR SOLN
Status: DC | PRN
  Administered 2012-06-23: 1000 mL

## 2012-06-23 MED ORDER — GLYCOPYRROLATE 0.2 MG/ML IJ SOLN
INTRAMUSCULAR | Status: DC | PRN
  Administered 2012-06-23: 0.4 mg via INTRAVENOUS

## 2012-06-23 MED ORDER — CEFAZOLIN SODIUM 1-5 GM-% IV SOLN
1.0000 g | Freq: Four times a day (QID) | INTRAVENOUS | Status: AC
  Administered 2012-06-23 – 2012-06-24 (×3): 1 g via INTRAVENOUS
  Filled 2012-06-23 (×3): qty 50

## 2012-06-23 MED ORDER — DIPHENHYDRAMINE HCL 12.5 MG/5ML PO ELIX
12.5000 mg | ORAL_SOLUTION | ORAL | Status: DC | PRN
Start: 2012-06-23 — End: 2012-06-27

## 2012-06-23 MED ORDER — NEOSTIGMINE METHYLSULFATE 1 MG/ML IJ SOLN
INTRAMUSCULAR | Status: DC | PRN
  Administered 2012-06-23: 3 mg via INTRAVENOUS

## 2012-06-23 SURGICAL SUPPLY — 66 items
BLADE SAW SGTL 83.5X18.5 (BLADE) ×2 IMPLANT
CEMENT BONE DEPUY (Cement) ×1 IMPLANT
CLOTH BEACON ORANGE TIMEOUT ST (SAFETY) ×2 IMPLANT
COVER SURGICAL LIGHT HANDLE (MISCELLANEOUS) ×2 IMPLANT
DRAPE INCISE IOBAN 66X45 STRL (DRAPES) ×2 IMPLANT
DRAPE SURG 17X11 SM STRL (DRAPES) ×2 IMPLANT
DRAPE SURG 17X23 STRL (DRAPES) ×2 IMPLANT
DRAPE U-SHAPE 47X51 STRL (DRAPES) ×2 IMPLANT
DRILL BIT 7/64X5 (BIT) IMPLANT
DRSG AQUACEL AG ADV 3.5X10 (GAUZE/BANDAGES/DRESSINGS) ×2 IMPLANT
DRSG MEPILEX BORDER 4X8 (GAUZE/BANDAGES/DRESSINGS) ×1 IMPLANT
DURAPREP 26ML APPLICATOR (WOUND CARE) ×2 IMPLANT
ELECT BLADE 4.0 EZ CLEAN MEGAD (MISCELLANEOUS) ×2
ELECT CAUTERY BLADE 6.4 (BLADE) ×2 IMPLANT
ELECT REM PT RETURN 9FT ADLT (ELECTROSURGICAL) ×2
ELECTRODE BLDE 4.0 EZ CLN MEGD (MISCELLANEOUS) ×1 IMPLANT
ELECTRODE REM PT RTRN 9FT ADLT (ELECTROSURGICAL) ×1 IMPLANT
FACESHIELD LNG OPTICON STERILE (SAFETY) ×6 IMPLANT
GLENOID ANCHOR PEG CROSSLK 44 (Orthopedic Implant) ×1 IMPLANT
GLOVE BIO SURGEON STRL SZ7.5 (GLOVE) ×2 IMPLANT
GLOVE BIO SURGEON STRL SZ8 (GLOVE) ×2 IMPLANT
GLOVE BIO SURGEON STRL SZ8.5 (GLOVE) ×1 IMPLANT
GLOVE EUDERMIC 7 POWDERFREE (GLOVE) ×2 IMPLANT
GLOVE SS BIOGEL STRL SZ 7.5 (GLOVE) ×1 IMPLANT
GLOVE SUPERSENSE BIOGEL SZ 7.5 (GLOVE) ×1
GLOVE SURG SS PI 8.5 STRL IVOR (GLOVE) ×1
GLOVE SURG SS PI 8.5 STRL STRW (GLOVE) IMPLANT
GOWN PREVENTION PLUS XXLARGE (GOWN DISPOSABLE) ×1 IMPLANT
GOWN STRL NON-REIN LRG LVL3 (GOWN DISPOSABLE) ×1 IMPLANT
GOWN STRL REIN XL XLG (GOWN DISPOSABLE) ×4 IMPLANT
HUMERAL HEAD 40MMX15MM (Orthopedic Implant) ×1 IMPLANT
HUMERAL STEM 10MM (Trauma) ×2 IMPLANT
KIT BASIN OR (CUSTOM PROCEDURE TRAY) ×2 IMPLANT
KIT ROOM TURNOVER OR (KITS) ×2 IMPLANT
MANIFOLD NEPTUNE II (INSTRUMENTS) ×2 IMPLANT
NDL HYPO 25GX1X1/2 BEV (NEEDLE) IMPLANT
NDL SUT 6 .5 CRC .975X.05 MAYO (NEEDLE) ×1 IMPLANT
NEEDLE HYPO 25GX1X1/2 BEV (NEEDLE) IMPLANT
NEEDLE MAYO TAPER (NEEDLE) ×2
NS IRRIG 1000ML POUR BTL (IV SOLUTION) ×2 IMPLANT
PACK SHOULDER (CUSTOM PROCEDURE TRAY) ×2 IMPLANT
PAD ARMBOARD 7.5X6 YLW CONV (MISCELLANEOUS) ×4 IMPLANT
PASSER SUT SWANSON 36MM LOOP (INSTRUMENTS) IMPLANT
PIN METAGLENE 2.5 (PIN) ×1 IMPLANT
SLING ARM FOAM STRAP MED (SOFTGOODS) ×1 IMPLANT
SLING ARM IMMOBILIZER LRG (SOFTGOODS) ×1 IMPLANT
SMARTMIX MINI TOWER (MISCELLANEOUS) ×2
SPONGE LAP 18X18 X RAY DECT (DISPOSABLE) ×2 IMPLANT
SPONGE LAP 4X18 X RAY DECT (DISPOSABLE) ×3 IMPLANT
STEM HUMERAL 10MM (Trauma) IMPLANT
STRIP CLOSURE SKIN 1/2X4 (GAUZE/BANDAGES/DRESSINGS) ×2 IMPLANT
SUCTION FRAZIER TIP 10 FR DISP (SUCTIONS) ×2 IMPLANT
SUT BONE WAX W31G (SUTURE) IMPLANT
SUT FIBERWIRE #2 38 T-5 BLUE (SUTURE) ×6
SUT MNCRL AB 3-0 PS2 18 (SUTURE) ×2 IMPLANT
SUT VIC AB 1 CT1 27 (SUTURE) ×4
SUT VIC AB 1 CT1 27XBRD ANBCTR (SUTURE) ×3 IMPLANT
SUT VIC AB 2-0 CT1 27 (SUTURE) ×2
SUT VIC AB 2-0 CT1 TAPERPNT 27 (SUTURE) ×2 IMPLANT
SUTURE FIBERWR #2 38 T-5 BLUE (SUTURE) ×2 IMPLANT
SYR 30ML SLIP (SYRINGE) ×2 IMPLANT
SYR CONTROL 10ML LL (SYRINGE) IMPLANT
TOWEL OR 17X24 6PK STRL BLUE (TOWEL DISPOSABLE) ×2 IMPLANT
TOWEL OR 17X26 10 PK STRL BLUE (TOWEL DISPOSABLE) ×2 IMPLANT
TOWER SMARTMIX MINI (MISCELLANEOUS) ×1 IMPLANT
WATER STERILE IRR 1000ML POUR (IV SOLUTION) ×2 IMPLANT

## 2012-06-23 NOTE — Progress Notes (Signed)
Patient stated she had a fall in bathtub Tuesday night could not get up until wednesday fell onto left shoulder - small bruise on posterior portion of shoulder. Wanted MD aware.

## 2012-06-23 NOTE — Progress Notes (Signed)
UR COMPLETED  

## 2012-06-23 NOTE — Anesthesia Postprocedure Evaluation (Signed)
  Anesthesia Post-op Note  Patient: [name]  Procedure(s) Performed: Procedure(s) (LRB) with comments: TOTAL SHOULDER ARTHROPLASTY (Left) - left total shoulder arthroplasty  Patient Location: PACU  Anesthesia Type:GA combined with regional for post-op pain  Level of Consciousness: sedated, patient cooperative and responds to stimulation  Airway and Oxygen Therapy: Patient Spontanous Breathing and Patient connected to nasal cannula oxygen  Post-op Pain: none  Post-op Assessment: Post-op Vital signs reviewed, Patient's Cardiovascular Status Stable, Respiratory Function Stable, Patent Airway, No signs of Nausea or vomiting and Pain level controlled  Post-op Vital Signs: Reviewed and stable  Complications: No apparent anesthesia complications

## 2012-06-23 NOTE — Anesthesia Preprocedure Evaluation (Signed)
Anesthesia Evaluation  Patient identified by MRN, date of birth, ID band Patient awake    Reviewed: Allergy & Precautions, H&P , NPO status , Patient's Chart, lab work & pertinent test results  History of Anesthesia Complications Negative for: history of anesthetic complications  Airway Mallampati: II TM Distance: >3 FB Neck ROM: Full    Dental  (+) Partial Upper, Caps and Dental Advisory Given   Pulmonary neg pulmonary ROS,  breath sounds clear to auscultation  Pulmonary exam normal       Cardiovascular hypertension, Pt. on medications Rhythm:Regular Rate:Normal  12/12 ECHO: mild AI, normal LVF   Neuro/Psych negative neurological ROS     GI/Hepatic Neg liver ROS, GERD-  Controlled,  Endo/Other  negative endocrine ROS  Renal/GU negative Renal ROS     Musculoskeletal   Abdominal   Peds  Hematology negative hematology ROS (+)   Anesthesia Other Findings   Reproductive/Obstetrics                           Anesthesia Physical Anesthesia Plan  ASA: II  Anesthesia Plan: General   Post-op Pain Management:    Induction: Intravenous  Airway Management Planned: Oral ETT  Additional Equipment:   Intra-op Plan:   Post-operative Plan: Extubation in OR  Informed Consent: I have reviewed the patients History and Physical, chart, labs and discussed the procedure including the risks, benefits and alternatives for the proposed anesthesia with the patient or authorized representative who has indicated his/her understanding and acceptance.   Dental advisory given  Plan Discussed with: Surgeon and CRNA  Anesthesia Plan Comments: (Plan routine monitors, GETA with Interscalene block for post op analgesia)        Anesthesia Quick Evaluation

## 2012-06-23 NOTE — Preoperative (Signed)
Beta Blockers   Reason not to administer Beta Blockers:Not Applicable 

## 2012-06-23 NOTE — H&P (Signed)
Sampson Goon    Chief Complaint: OA OF THE LEFT SHOULDER HPI: The patient is a 78 y.o. female with end stage left shoulder arthritits  Past Medical History  Diagnosis Date  . Hypertension   . High cholesterol   . Arthritis   . Mitral valve disorder   . Diverticulosis   . GERD (gastroesophageal reflux disease)   . Blood dyscrasia     IGg  kappa    Past Surgical History  Procedure Date  . Abdominal hysterectomy   . Appendectomy   . Laparoscopy 08/25/2011    Procedure: LAPAROSCOPY DIAGNOSTIC;  Surgeon: Rulon Abide, DO;  Location: [facility];  Service: General;  Laterality: N/A;  reduction obturator hernia obturator hernia repair  . Patella fracture surgery   . Hernia repair     History reviewed. No pertinent family history.  Social History:  reports that she has never smoked. She has never used smokeless tobacco. She reports that she does not drink alcohol or use illicit drugs.  Allergies:  Allergies  Allergen Reactions  . Zyrtec (Cetirizine Hcl) Hives    Medications Prior to Admission  Medication Sig Dispense Refill  . aspirin 500 MG EC tablet Take 1,000 mg by mouth at bedtime as needed. For pain      . beta carotene w/minerals (OCUVITE) tablet Take 1 tablet by mouth daily.      . calcium citrate-vitamin D (CITRACAL+D) 315-200 MG-UNIT per tablet Take 1 tablet by mouth daily.       . Cholecalciferol (VITAMIN D) 2000 UNITS CAPS Take 2,000 Units by mouth daily.        . COD LIVER OIL PO Take 1 capsule by mouth daily.      Marland Kitchen FLECTOR 1.3 % PTCH Take 1 patch by mouth daily as needed. For pain      . furosemide (LASIX) 20 MG tablet Take 20 mg by mouth daily.        Marland Kitchen gabapentin (NEURONTIN) 300 MG capsule Take 300-600 mg by mouth 3 (three) times daily. Take one tablet in the morning and at noon. Take two tablet at night      . lisinopril (PRINIVIL,ZESTRIL) 20 MG tablet Take 20 mg by mouth daily.        . pantoprazole (PROTONIX) 40 MG tablet Take 40 mg by mouth daily.       .  simvastatin (ZOCOR) 40 MG tablet Take 40 mg by mouth at bedtime.        . Teriparatide, Recombinant, (FORTEO [place]) Inject into the skin at bedtime.      . traMADol (ULTRAM) 50 MG tablet Take 50 mg by mouth every 6 (six) hours as needed. For pain      . vitamin C (ASCORBIC ACID) 500 MG tablet Take 500 mg by mouth daily.      . vitamin E 400 UNIT capsule Take 400 Units by mouth daily.      Bertram Gala Glycol-Propyl Glycol (SYSTANE ULTRA PF) 0.4-0.3 % SOLN Place 1 drop into both eyes 2 (two) times daily.       . polyethylene glycol (MIRALAX / GLYCOLAX) packet Take 17 g by mouth daily.           Physical Exam: left shoulder with painful and restricted motion as noted at 10/7 office visit  Vitals  Temp:  [98.4 F (36.9 C)-98.5 F (36.9 C)] 98.4 F (36.9 C) (10/31 0618) Pulse Rate:  [80-84] 80  (10/31 0618) Resp:  [18] 18  (10/31 0618)  BP: (122-126)/(64-74) 126/74 mmHg (10/31 0618) SpO2:  [97 %-98 %] 98 % (10/31 0618)  Assessment/Plan  Impression: OA OF THE LEFT SHOULDER  Plan of Action: Procedure(s): TOTAL SHOULDER ARTHROPLASTY  Sheily Lineman M 06/23/2012, 7:45 AM

## 2012-06-23 NOTE — Transfer of Care (Signed)
Immediate Anesthesia Transfer of Care Note  Patient: [name]  Procedure(s) Performed: Procedure(s) (LRB) with comments: TOTAL SHOULDER ARTHROPLASTY (Left) - left total shoulder arthroplasty  Patient Location: PACU  Anesthesia Type:GA combined with regional for post-op pain  Level of Consciousness: awake, alert , oriented and patient cooperative  Airway & Oxygen Therapy: Patient Spontanous Breathing and Patient connected to nasal cannula oxygen  Post-op Assessment: Report given to PACU RN, Post -op Vital signs reviewed and stable and Patient moving all extremities  Post vital signs: Reviewed and stable  Complications: No apparent anesthesia complications

## 2012-06-23 NOTE — Op Note (Signed)
06/23/2012  10:04 AM  PATIENT:   [name]  78 y.o. female  PRE-OPERATIVE DIAGNOSIS:  OA OF THE LEFT SHOULDER  POST-OPERATIVE DIAGNOSIS:  same  PROCEDURE:  L TSA 10 stem, 40X15 head, 44 glenoid  SURGEON:  Chun Sellen, Vania Rea M.D.  ASSISTANTS: Shuford pac   ANESTHESIA:   GET + ISB  EBL: 250  SPECIMEN:  none  Drains: none   PATIENT DISPOSITION:  PACU - hemodynamically stable.    PLAN OF CARE: Admit to inpatient   Dictation# [id number]

## 2012-06-23 NOTE — Anesthesia Procedure Notes (Addendum)
Procedure Name: Intubation Date/Time: 06/23/2012 8:03 AM Performed by: Jerilee Hoh Pre-anesthesia Checklist: Patient identified, Emergency Drugs available, Suction available and Patient being monitored Patient Re-evaluated:Patient Re-evaluated prior to inductionOxygen Delivery Method: Circle system utilized Preoxygenation: Pre-oxygenation with 100% oxygen Intubation Type: IV induction Ventilation: Mask ventilation without difficulty Laryngoscope Size: Mac and 3 Grade View: Grade II Tube type: Oral Tube size: 7.5 mm Number of attempts: 1 Airway Equipment and Method: Stylet Placement Confirmation: ETT inserted through vocal cords under direct vision,  positive ETCO2 and breath sounds checked- equal and bilateral Secured at: 21 cm Tube secured with: Tape Dental Injury: Teeth and Oropharynx as per pre-operative assessment    Anesthesia Regional Block:  Interscalene brachial plexus block  Pre-Anesthetic Checklist: ,, timeout performed, Correct Patient, Correct Site, Correct Laterality, Correct Procedure, Correct Position, site marked, Risks and benefits discussed,  Surgical consent,  Pre-op evaluation,  At surgeon's request and post-op pain management  Laterality: Left  Prep: chloraprep       Needles:  Injection technique: Single-shot  Needle Type: Stimulator Needle - 40     Needle Length: 4cm  Needle Gauge: 22 and 22 G    Additional Needles:  Procedures: nerve stimulator Interscalene brachial plexus block  Nerve Stimulator or Paresthesia:  Response: forearm twitch, 0.44 mA, 0.1 ms,   Additional Responses:   Narrative:  Start time: 06/23/2012 7:05 AM End time: 06/23/2012 7:12 AM Injection made incrementally with aspirations every 5 mL.  Performed by: Personally  Anesthesiologist: Sandford Craze, MD  Additional Notes: Pt identified in Holding room.  Monitors applied. Working IV access confirmed. Sterile prep L neck.  #22ga PNS to forearm twitch at 0.43mA threshold.   25cc 0.5% Bupivacaine with 1:200k epi injected incrementally after negative test dose.  Patient asymptomatic, VSS, no heme aspirated, tolerated well.   Sandford Craze, MD  Interscalene brachial plexus block

## 2012-06-24 ENCOUNTER — Encounter (HOSPITAL_COMMUNITY): Payer: Self-pay | Admitting: Orthopedic Surgery

## 2012-06-24 NOTE — Progress Notes (Signed)
AMEYA VOWELL  MRN: [id number] DOB/Age: [birth year] 78 y.o. Physician: Jacquelyne Balint Procedure: Procedure(s) (LRB): TOTAL SHOULDER ARTHROPLASTY (Left)     Subjective: Had a lot of pain through the night but more controlled now. Voiding OK but requiring a lot of assistance to get up. She lives alone and reports she is unsure she can manage in home environment and wishes to explore [facility] options. She stayed in a facility after a patella fracture and feels that would be helpful  Vital Signs Temp:  [96.8 F (36 C)-98.5 F (36.9 C)] 98.5 F (36.9 C) (11/01 0537) Pulse Rate:  [29-106] 106  (11/01 0537) Resp:  [10-16] 16  (11/01 0537) BP: (120-155)/(57-95) 127/59 mmHg (11/01 0537) SpO2:  [92 %-100 %] 94 % (11/01 0537)  Lab Results No results found for this basename: WBC:2,HGB:2,HCT:2,PLT:2 in the last 72 hours BMET No results found for this basename: NA:2,K:2,CL:2,CO2:2,GLUCOSE:2,BUN:2,CREATININE:2,CALCIUM:2 in the last 72 hours INR  Date Value Range Status  03/05/2011 2.13* 0.00 - 1.49 Final     Exam Frail A&O Scant drainage to dressing NVI Lue        Plan SW consult for [facility] Plan will be to stay through weekend with DC to skilled Monday Am OT while inpt  Olisa Quesnel for Dr.Kevin Supple 06/24/2012, 8:29 AM

## 2012-06-24 NOTE — Evaluation (Signed)
Occupational Therapy Evaluation Patient Details Name: [name] MRN: [id number] DOB: [birth year] Today's Date: 06/24/2012 Time: 7829-5621 OT Time Calculation (min): 50 min  OT Assessment / Plan / Recommendation Clinical Impression  Pt s/p Left TSA.  Also noted in chart and by RN that pt had recent fall in bathtub and remained there for a day before being found by daughter (day before surgery).  Will continue to follow acutely.  Recommending ST SNF for d/c plan as pt is not safe to go home alone at this time.    OT Assessment  Patient needs continued OT Services    Follow Up Recommendations  Skilled nursing facility    Barriers to Discharge Decreased caregiver support Daughter travels for work.  Per pt report, no other family/friends available.  Equipment Recommendations  3 in 1 bedside comode    Recommendations for Other Services PT consult  Frequency  Min 3X/week    Precautions / Restrictions Precautions Precautions: Shoulder Type of Shoulder Precautions: Supple TSA protocol Required Braces or Orthoses: Other Brace/Splint (LUE sling) Restrictions Weight Bearing Restrictions: Yes LUE Weight Bearing: Non weight bearing   Pertinent Vitals/Pain See vitals    ADL  Eating/Feeding: Performed;Independent Where Assessed - Eating/Feeding: Chair Grooming: Performed;Wash/dry face;Brushing hair;Teeth care;Set up;Min guard;Wash/dry hands Where Assessed - Grooming: Unsupported sitting;Supported standing Upper Body Bathing: Performed;Chest;Right arm;Left arm;Abdomen;Moderate assistance Where Assessed - Upper Body Bathing: Unsupported sitting Upper Body Dressing: Performed;Moderate assistance Where Assessed - Upper Body Dressing: Unsupported sitting Toilet Transfer: Performed;Moderate assistance Toilet Transfer Method: Sit to stand Toilet Transfer Equipment: Raised toilet seat with arms (or 3-in-1 over toilet) Toileting - Clothing Manipulation and Hygiene: Performed;Minimal  assistance Where Assessed - Toileting Clothing Manipulation and Hygiene: Sit to stand from 3-in-1 or toilet Equipment Used: Other (comment) (LUE sling) Transfers/Ambulation Related to ADLs: Min assist for ambulation in room. Pt pushed IV pole with RUE.  Assist for steadying and balance. ADL Comments: Educated pt on ADL technique and threading LUE through gown first.  Educated pt on sling positioning.  Pt requried max assist to don/doff LUE sling. Performed LUE PROM exercises but pt unable to tolerate pendulums at this time (pain and feeling lightheaded).    OT Diagnosis: Generalized weakness;Cognitive deficits;Acute pain  OT Problem List: Decreased activity tolerance;Impaired balance (sitting and/or standing);Decreased range of motion;Decreased strength;Decreased cognition;Decreased knowledge of precautions;Impaired UE functional use;Pain OT Treatment Interventions: Self-care/ADL training;Therapeutic exercise;DME and/or AE instruction;Therapeutic activities;Patient/family education;Balance training;Cognitive remediation/compensation   OT Goals Acute Rehab OT Goals OT Goal Formulation: With patient Time For Goal Achievement: 07/08/12 Potential to Achieve Goals: Good ADL Goals Pt Will Perform Upper Body Bathing: with supervision;Sitting, chair;Sitting, edge of bed;Unsupported ADL Goal: Upper Body Bathing - Progress: Goal set today Pt Will Perform Lower Body Bathing: with supervision;Sit to stand from chair;Sit to stand from bed ADL Goal: Lower Body Bathing - Progress: Goal set today Pt Will Perform Upper Body Dressing: with supervision;Sitting, chair;Sitting, bed;Unsupported ADL Goal: Upper Body Dressing - Progress: Goal set today Pt Will Perform Lower Body Dressing: with supervision;Sit to stand from chair;Sit to stand from bed;Unsupported ADL Goal: Lower Body Dressing - Progress: Goal set today Pt Will Transfer to Toilet: with supervision;Ambulation;Comfort height toilet ADL Goal: Toilet  Transfer - Progress: Goal set today Pt Will Perform Toileting - Clothing Manipulation: with supervision;Sitting on 3-in-1 or toilet;Standing ADL Goal: Toileting - Clothing Manipulation - Progress: Goal set today Pt Will Perform Toileting - Hygiene: with supervision;Sit to stand from 3-in-1/toilet;Standing at 3-in-1/toilet ADL Goal: Toileting - Hygiene - Progress: Goal  set today Arm Goals Additional Arm Goal #1: Pt will tolerate LUE PROM HEP to specificed limits (ER,IR, Abduction, FF per Supple protocol). Arm Goal: Additional Goal #1 - Progress: Goal set today Miscellaneous OT Goals Miscellaneous OT Goal #1: Pt will perform pendulums 2-3 x daily with supervision. OT Goal: Miscellaneous Goal #1 - Progress: Goal set today Miscellaneous OT Goal #2: Pt will don/doff LUE sling with supervision. OT Goal: Miscellaneous Goal #2 - Progress: Goal set today  Visit Information  Last OT Received On: 06/24/12 Assistance Needed: +1    Subjective Data  Subjective: "I don't think I should go home right now"   Prior Functioning     Home Living Lives With: Alone Available Help at Discharge: Family;Available PRN/intermittently Bathroom Shower/Tub: Engineer, manufacturing systems: Standard Prior Function Level of Independence: Independent Driving: Yes Comments: Pt reports she does not think family/friends are available to assist at home. Communication Communication: No difficulties Dominant Hand: Right         Vision/Perception     Cognition  Overall Cognitive Status: Impaired Area of Impairment: Memory Arousal/Alertness: Lethargic Orientation Level: Appears intact for tasks assessed Behavior During Session: Lethargic Memory Deficits: Pt frequently repeating questions/statements throughout session. Cognition - Other Comments: When asked if she need to use bathroom, pt stated "I thought that's what I'm doing right now. I'm not on the toilet?"    Extremity/Trunk Assessment Right Upper  Extremity Assessment RUE ROM/Strength/Tone: [facility] for tasks assessed Left Upper Extremity Assessment LUE ROM/Strength/Tone: Deficits LUE ROM/Strength/Tone Deficits: Digits, wrist and elbow AROM WFL.  PROM ER to 10-15 degrees.  PROM abduction to ~30 degrees.  PROM FF ~30 degrees.       Mobility Bed Mobility Bed Mobility: Not assessed Transfers Transfers: Sit to Stand;Stand to Sit Sit to Stand: 3: Mod assist;From chair/3-in-1;With armrests;With upper extremity assist Stand to Sit: 4: Min assist;To chair/3-in-1;With armrests;With upper extremity assist Details for Transfer Assistance: Cues for RUE hand placement.  Pt required assist to lift off of surface and for steadiness.  Pt with intial posterior lean but then able to correct.       Shoulder Instructions Donning/doffing shirt without moving shoulder: Moderate assistance Method for sponge bathing under operated UE: Moderate assistance Donning/doffing sling/immobilizer: Maximal assistance Correct positioning of sling/immobilizer: Maximal assistance ROM for elbow, wrist and digits of operated UE: Supervision/safety   Exercise Shoulder Exercises Shoulder Flexion: PROM;Left;10 reps;Seated Shoulder ABduction: PROM;Left;10 reps;Seated Shoulder External Rotation: PROM;Left;10 reps;Seated Elbow Flexion: AROM;Left;10 reps;Seated Elbow Extension: AROM;Left;10 reps;Seated Wrist Flexion: AROM;Left;10 reps;Seated Wrist Extension: AROM;Left;10 reps;Seated Digit Composite Flexion: AROM;Left;10 reps;Seated Composite Extension: AROM;Left;10 reps;Seated   Balance     End of Session OT - End of Session Equipment Utilized During Treatment: Other (comment) (LUE sling) Activity Tolerance: Patient limited by pain;Other (comment) (lightheaded) Patient left: in chair;with call bell/phone within reach;with nursing in room Nurse Communication: Mobility status;Other (comment) (pt feeling lightheaded; IV leaking)  GO    06/24/2012 Cipriano Mile  OTR/L Pager [phone] Office [phone]  Cipriano Mile 06/24/2012, 11:01 AM

## 2012-06-24 NOTE — Op Note (Signed)
NAMECORIANA, ANGELLO                ACCOUNT NO.:  [id number]  MEDICAL RECORD NO.:  [id number]  LOCATION:  5N28C                        FACILITY:  [facility]  PHYSICIAN:  Vania Rea. Koleen Celia, M.D.  DATE OF BIRTH:  August 04, 1929  DATE OF PROCEDURE:  06/23/2012 DATE OF DISCHARGE:                              OPERATIVE REPORT   ADDENDUM  Ralene Bathe, PA-C was used as an Geophysicist/field seismologist throughout this case essential for help with positioning the extremity, retraction, tissue manipulation, manipulation to the implants, wound closure, and intraoperative decision making, and allowing the case to proceed in a timely efficient and staged fashion.     Vania Rea. Cherree Conerly, M.D.     KMS/MEDQ  D:  06/23/2012  T:  06/24/2012  Job:  [id number]

## 2012-06-24 NOTE — Op Note (Signed)
[name], [name]                ACCOUNT NO.:  [id number]  MEDICAL RECORD NO.:  [id number]  LOCATION:  5N28C                        FACILITY:  [facility]  PHYSICIAN:  Vania Rea. Pluma Diniz, M.D.  DATE OF BIRTH:  [birth year]  DATE OF PROCEDURE:  06/23/2012 DATE OF DISCHARGE:                              OPERATIVE REPORT   PREOPERATIVE DIAGNOSIS:  End-stage left shoulder osteoarthrosis.  POSTOPERATIVE DIAGNOSIS:  End-stage left shoulder osteoarthrosis.  PROCEDURE:  Left total shoulder arthroplasty utilizing a press-fit size 10 DePuy global stem, a 40 x 15 humeral head, and a cemented pegged 44 glenoid.  SURGEON:  Vania Rea. Shatoya Roets, M.D.  Threasa HeadsFrench Ana A. Shuford, PA-C  ANESTHESIA:  General endotracheal as well as an interscalene block.  ESTIMATED BLOOD LOSS:  250 mL.  DRAINS:  None.  HISTORY:  [name] is an 78 year old female who has had chronic and progressive increasing left shoulder pain with associated functional limitations related to end-stage glenohumeral arthrosis.  She is brought to the operating room at this time for planned left total shoulder arthroplasty.  I preoperatively counseled [name] on treatment options as well as risks versus benefits thereof.  Possible surgical complications were reviewed including the potential for bleeding, infection, neurovascular injury, persistent pain, loss of motion, anesthetic complication, failure of the implant, and possible need for additional surgery.  She understands and accepts and agrees with our planned procedure.  PROCEDURE IN DETAIL:  After undergoing routine preop evaluation, the patient received prophylactic antibiotics.  An interscalene block was established in the holding area by the Anesthesia Department.  Placed supine on operating table, underwent smooth induction of a general endotracheal anesthesia.  Placed into beach-chair position and appropriately padded and protected.  Left shoulder girdle region  was then sterilely prepped and draped in standard fashion.  Time-out was called.  In an anterior approach, the left shoulder was made through a 15 cm incision along the deltopectoral interval.  Skin flaps were elevated.  Electrocautery was used for hemostasis.  Deltopectoral interval was then developed and the cephalic vein retracted laterally, and the upper cm of the pec major divided to enhance the exposure. Conjoined tendon was mobilized and retracted medially.  Adhesions released in the subacromial/subdeltoid bursa.  Retractors were then placed.  The biceps tendon sheath was then unroofed, biceps tendon was tenotomized for later tenodesis, and then we divided the subscapularis transversely across the rotator interval, the base of the coracoid, and then distally leaving 0.5 cm cuff of tissue for later repair and the free margin was then tagged with a series of #2 FiberWire sutures.  We then divided the capsule anteroinferiorly allowing delivery of the humeral head of the wound, took care to protect the rotator cuff superiorly and posteriorly.  The humeral head was markedly degenerated. We outlined our proposed humeral head resection with the extramedullary guide, and then the oscillating saw was used to resect the humeral head defect in the rotator cuff.  We then performed hand reaming of the canal to size 10 and I should mention that we did maintain native retroversion approximately 30 degrees.  We then performed broaching up to size 10 with excellent fit.  We then placed  a metal cap over the cut surface of the proximal humerus.  Turned attention to the glenoid, which we exposed with combination of retractors and then performed a circumferential resection of the labrum and capsular release anteriorly and inferiorly allowing complete exposure of the periphery of the glenoid and also mobilized subscapularis.  A guide pin was then placed in the center of the glenoid.  We reamed with the  44 reamer.  The central drill hole and the peripheral peg holes were all placed with the standard technique. Trial showed excellent fit.  The glenoid was then meticulously irrigated, cement was mixed and at the appropriate consisting, we introduced cement into the peripheral peg holes and then placed the final 44 glenoid with excellent fit and fixation.  Cement was allowed to harden.  We returned our attention to the proximal humerus where we impacted the size 10 stem with bone grafting from the humeral head around the capsule region obtaining excellent fit and fixation.  We then performed trial reductions and found that the 40 x 15 humeral head provided the best coverage of the proximal humerus with good soft tissue balance and final 40 x 15 head was impacted.  Final reduction was performed much to our satisfaction.  We then repaired the subscapularis back to the lesser tuberosity using #2 FiberWires with excellent reapposition and closed the rotator interval with 2 figure-of-eight FiberWires and the overall construct was much to our satisfaction with excellent soft tissue balance and good shoulder motion.  The biceps tendon was then tenodesed at the distal aspect of the bicipital groove. The wound was irrigated.  Hemostasis was obtained.  We then performed routine closure of the deltopectoral interval, and I should mention it did look as though the cephalic vein was attenuated at its most proximal aspect and we cauterized at this level.  We then closed the deltopectoral interval with 0-Vicryl, 2-0 Vicryl for subcu, and intracuticular 3-0 Monocryl for the skin, followed by Steri-Strips and a dry dressing.  Left arm was then placed in a sling.  The patient was awakened, extubated, and taken to recovery room in stable condition.     Vania Rea. Roshonda Sperl, M.D.     KMS/MEDQ  D:  06/23/2012  T:  06/24/2012  Job:  [id number]

## 2012-06-25 NOTE — Progress Notes (Signed)
Subjective: Comfortable this morning. Good circulation in hand. Dressing intact.   Objective: Vital signs in last 24 hours: Temp:  [97.9 F (36.6 C)-98.7 F (37.1 C)] 97.9 F (36.6 C) (11/02 0615) Pulse Rate:  [78-94] 78  (11/02 0615) Resp:  [16] 16  (11/02 0615) BP: (94-125)/(54-67) 125/58 mmHg (11/02 0615) SpO2:  [92 %-97 %] 97 % (11/02 0615)  Intake/Output from previous day: 11/01 0701 - 11/02 0700 In: 1817.5 [P.O.:480; I.V.:1337.5] Out: -  Intake/Output this shift:    No results found for this basename: HGB:5 in the last 72 hours No results found for this basename: WBC:2,RBC:2,HCT:2,PLT:2 in the last 72 hours No results found for this basename: NA:2,K:2,CL:2,CO2:2,BUN:2,CREATININE:2,GLUCOSE:2,CALCIUM:2 in the last 72 hours No results found for this basename: LABPT:2,INR:2 in the last 72 hours  Neurovascular intact  Assessment/Plan: SNF Monday   Marsia Cino A 06/25/2012, 7:58 AM

## 2012-06-25 NOTE — Progress Notes (Signed)
Occupational Therapy Treatment Patient Details Name: [name] MRN: [id number] DOB: [birth year] Today's Date: 06/25/2012 Time: 0902-0930 OT Time Calculation (min): 28 min  OT Assessment / Plan / Recommendation Comments on Treatment Session Pt appears less confused today; however requires frequent redirection to task and adhering to shoulder precautions. Written handouts reviewed again and highlighted with pt.  Recommend for pt to wear sling at all times with the exception of exercise and ADL to encourage adherance to shoulder protocol.    Follow Up Recommendations  Skilled nursing facility    Barriers to Discharge       Equipment Recommendations  3 in 1 bedside comode    Recommendations for Other Services    Frequency Min 3X/week   Plan Discharge plan remains appropriate    Precautions / Restrictions Precautions Precautions: Shoulder Type of Shoulder Precautions: Supple TSA protocol Precaution Booklet Issued: Yes (comment) Precaution Comments: reviewed all handouts with pt Required Braces or Orthoses: Other Brace/Splint Other Brace/Splint: L sling Restrictions Weight Bearing Restrictions: Yes LUE Weight Bearing: Non weight bearing   Pertinent Vitals/Pain No c/o pain with exceptio of during end range of ROM. Pt tolerated well.    ADL  Upper Body Dressing: Moderate assistance Toilet Transfer: Minimal assistance Equipment Used: Gait belt Transfers/Ambulation Related to ADLs: Min guard ADL Comments: Focus of session on education of exercise and shoulder protocol. Pt given handouts on exercises and how to complete ADL s/p TSA. Pt instructed to give this information to therapist at facility. Pt instructed to only remove sling for ADL and exercise due to apparent memory deficits. Feel this is best approach for rehab of pt's shoulder at this time.    OT Diagnosis:    OT Problem List:   OT Treatment Interventions:     OT Goals Acute Rehab OT Goals OT Goal Formulation: With  patient Time For Goal Achievement: 07/08/12 Potential to Achieve Goals: Good ADL Goals Pt Will Perform Upper Body Bathing: with supervision;Sitting, chair;Sitting, edge of bed;Unsupported ADL Goal: Upper Body Bathing - Progress: Progressing toward goals Pt Will Perform Lower Body Bathing: with supervision;Sit to stand from chair;Sit to stand from bed Pt Will Perform Upper Body Dressing: with supervision;Sitting, chair;Sitting, bed;Unsupported ADL Goal: Upper Body Dressing - Progress: Progressing toward goals Pt Will Perform Lower Body Dressing: with supervision;Sit to stand from chair;Sit to stand from bed;Unsupported Pt Will Transfer to Toilet: with supervision;Ambulation;Comfort height toilet ADL Goal: Toilet Transfer - Progress: Progressing toward goals Pt Will Perform Toileting - Clothing Manipulation: with supervision;Sitting on 3-in-1 or toilet;Standing ADL Goal: Toileting - Clothing Manipulation - Progress: Progressing toward goals Pt Will Perform Toileting - Hygiene: with supervision;Sit to stand from 3-in-1/toilet;Standing at 3-in-1/toilet ADL Goal: Toileting - Hygiene - Progress: Progressing toward goals Arm Goals Additional Arm Goal #1: Pt will tolerate LUE PROM HEP to specificed limits (ER,IR, Abduction, FF per Supple protocol). Arm Goal: Additional Goal #1 - Progress: Progressing toward goals Miscellaneous OT Goals Miscellaneous OT Goal #1: Pt will perform pendulums 2-3 x daily with supervision. OT Goal: Miscellaneous Goal #1 - Progress: Progressing toward goals Miscellaneous OT Goal #2: Pt will don/doff LUE sling with supervision. OT Goal: Miscellaneous Goal #2 - Progress: Progressing toward goals  Visit Information  Last OT Received On: 06/25/12 Assistance Needed: +1    Subjective Data      Prior Functioning       Cognition  Overall Cognitive Status: Impaired Area of Impairment: Memory Arousal/Alertness: Awake/alert Orientation Level: Appears intact for tasks  assessed Behavior During Session:  WFL for tasks performed Memory: Decreased recall of precautions Memory Deficits: repeatedly asking what room she is in Cognition - Other Comments: distracted by using the cell phone    Mobility  Shoulder Instructions Bed Mobility Bed Mobility: Supine to Sit;Sitting - Scoot to Edge of Bed Supine to Sit: 5: Supervision;With rails Sitting - Scoot to Edge of Bed: 5: Supervision;With rail Details for Bed Mobility Assistance: vc to not WB via LUE Transfers Transfers: Sit to Stand;Stand to Sit Sit to Stand: 5: Supervision Stand to Sit: 5: Supervision Details for Transfer Assistance: improved balance today. Cues to not use LUE to brace self when sitting.q   Donning/doffing sling/immobilizer: Maximal assistance Correct positioning of sling/immobilizer: Maximal assistance Pendulum exercises (written home exercise program): Minimal assistance ROM for elbow, wrist and digits of operated UE: Supervision/safety Sling wearing schedule (on at all times/off for ADL's): Moderate assistance Proper positioning of operated UE when showering: Moderate assistance Positioning of UE while sleeping: Minimal assistance   Exercises  Shoulder Exercises Pendulum Exercise: PROM;Left;10 reps;Standing Shoulder Flexion: PROM;10 reps;Supine (to 70) Shoulder ABduction: Left;PROM;Supine;10 reps (to 40) Shoulder External Rotation: PROM;Left;10 reps;Supine (to 15) Elbow Flexion: AROM;Left;10 reps;Supine Elbow Extension: AROM;Left;10 reps;Supine Wrist Flexion: AROM;Left;10 reps;Seated Wrist Extension: AROM;Left;10 reps;Seated Digit Composite Flexion: AROM;Left;10 reps;Seated Composite Extension: AROM;Left;10 reps;Seated Neck Flexion: AROM;5 reps;Seated Neck Extension: AROM;5 reps;Seated Neck Lateral Flexion - Right: AROM;5 reps;Seated Neck Lateral Flexion - Left: AROM;5 reps;Seated   Balance  S   End of Session OT - End of Session Equipment Utilized During Treatment: Gait  belt Activity Tolerance: Patient tolerated treatment well Patient left: in chair;with call bell/phone within reach Nurse Communication: Mobility status;Other (comment)  GO     Grigor Lipschutz,HILLARY 06/25/2012, 9:40 AM Luisa Dago, OTR/L  [phone] 06/25/2012

## 2012-06-25 NOTE — Progress Notes (Signed)
Occupational Therapy Treatment Patient Details Name: QUINETTE HENTGES MRN: [id number] DOB: May 25, 1929 Today's Date: 06/25/2012 Time: 1191-4782 OT Time Calculation (min): 25 min  OT Assessment / Plan / Recommendation Comments on Treatment Session Pt making good progress with LUE ROM and ADL. Apparent memory dificts. Will f/u this am with 2nd session to reinformce precautions and exercise.     Follow Up Recommendations  Skilled nursing facility    Barriers to Discharge       Equipment Recommendations  3 in 1 bedside comode    Recommendations for Other Services    Frequency Min 3X/week   Plan Discharge plan remains appropriate    Precautions / Restrictions Precautions Precautions: Shoulder Type of Shoulder Precautions: Supple TSA protocol Required Braces or Orthoses: Other Brace/Splint Restrictions Weight Bearing Restrictions: Yes LUE Weight Bearing: Non weight bearing   Pertinent Vitals/Pain "hurts just a little"    ADL  Upper Body Dressing: Moderate assistance Toilet Transfer: Minimal assistance Equipment Used: Gait belt Transfers/Ambulation Related to ADLs: Min guard ADL Comments: Pt needing frequent redirection. focus of session on educating pt on exercise protocol.    OT Diagnosis:    OT Problem List:   OT Treatment Interventions:     OT Goals Acute Rehab OT Goals OT Goal Formulation: With patient Time For Goal Achievement: 07/08/12 Potential to Achieve Goals: Good ADL Goals Pt Will Perform Upper Body Bathing: with supervision;Sitting, chair;Sitting, edge of bed;Unsupported ADL Goal: Upper Body Bathing - Progress: Progressing toward goals Pt Will Perform Lower Body Bathing: with supervision;Sit to stand from chair;Sit to stand from bed Pt Will Perform Upper Body Dressing: with supervision;Sitting, chair;Sitting, bed;Unsupported ADL Goal: Upper Body Dressing - Progress: Progressing toward goals Pt Will Perform Lower Body Dressing: with supervision;Sit to stand  from chair;Sit to stand from bed;Unsupported Pt Will Transfer to Toilet: with supervision;Ambulation;Comfort height toilet ADL Goal: Toilet Transfer - Progress: Progressing toward goals Pt Will Perform Toileting - Clothing Manipulation: with supervision;Sitting on 3-in-1 or toilet;Standing ADL Goal: Toileting - Clothing Manipulation - Progress: Progressing toward goals Pt Will Perform Toileting - Hygiene: with supervision;Sit to stand from 3-in-1/toilet;Standing at 3-in-1/toilet ADL Goal: Toileting - Hygiene - Progress: Progressing toward goals Arm Goals Additional Arm Goal #1: Pt will tolerate LUE PROM HEP to specificed limits (ER,IR, Abduction, FF per Supple protocol). Arm Goal: Additional Goal #1 - Progress: Progressing toward goals Miscellaneous OT Goals Miscellaneous OT Goal #1: Pt will perform pendulums 2-3 x daily with supervision. OT Goal: Miscellaneous Goal #1 - Progress: Progressing toward goals Miscellaneous OT Goal #2: Pt will don/doff LUE sling with supervision. OT Goal: Miscellaneous Goal #2 - Progress: Progressing toward goals  Visit Information  Last OT Received On: 06/25/12 Assistance Needed: +1    Subjective Data      Prior Functioning       Cognition  Overall Cognitive Status: Impaired Area of Impairment: Memory Arousal/Alertness: Awake/alert Orientation Level: Appears intact for tasks assessed Behavior During Session: [facility] for tasks performed Memory: Decreased recall of precautions Memory Deficits: repeatedly asking what room she is in    Mobility  Shoulder Instructions Bed Mobility Bed Mobility: Supine to Sit;Sitting - Scoot to Edge of Bed Supine to Sit: 5: Supervision;With rails Sitting - Scoot to Edge of Bed: 5: Supervision;With rail Details for Bed Mobility Assistance: vc to not WB via LUE Transfers Transfers: Sit to Stand;Stand to Sit Sit to Stand: 4: Min guard;With upper extremity assist;From bed Stand to Sit: 4: Min guard;With upper extremity  assist;To chair/3-in-1 Details for Transfer  Assistance: Cues for NWB LUE   Donning/doffing sling/immobilizer: Maximal assistance Correct positioning of sling/immobilizer: Maximal assistance Pendulum exercises (written home exercise program): Moderate assistance (difficulty understanding concept of pendulums) ROM for elbow, wrist and digits of operated UE: Supervision/safety Sling wearing schedule (on at all times/off for ADL's): Maximal assistance Proper positioning of operated UE when showering: Maximal assistance Positioning of UE while sleeping: Maximal assistance (Pt did not remember going over this information from yesterd)   Exercises  Shoulder Exercises Pendulum Exercise: PROM   Balance  S   End of Session OT - End of Session Equipment Utilized During Treatment: Gait belt Activity Tolerance: Patient tolerated treatment well Patient left: in chair;with call bell/phone within reach Nurse Communication: Mobility status;Other (comment)  GO     Yariela Tison,HILLARY 06/25/2012, 9:29 AM Luisa Dago, OTR/L  [phone] 06/25/2012

## 2012-06-26 MED ORDER — ALPRAZOLAM 0.25 MG PO TABS
ORAL_TABLET | ORAL | Status: AC
  Filled 2012-06-26: qty 1

## 2012-06-26 NOTE — Progress Notes (Signed)
Subjective: Pt has no complaints, very pleased with shoulder, pain well controlled, eating without problems.   Objective: Vital signs in last 24 hours: Temp:  [97.8 F (36.6 C)-98.8 F (37.1 C)] 98.8 F (37.1 C) (11/03 0710) Pulse Rate:  [77-90] 90  (11/03 0710) Resp:  [16-18] 18  (11/03 0710) BP: (99-141)/(57-79) 141/63 mmHg (11/03 0710) SpO2:  [94 %-97 %] 95 % (11/03 0710)  Intake/Output from previous day:   Intake/Output this shift:    No results found for this basename: HGB:5 in the last 72 hours No results found for this basename: WBC:2,RBC:2,HCT:2,PLT:2 in the last 72 hours No results found for this basename: NA:2,K:2,CL:2,CO2:2,BUN:2,CREATININE:2,GLUCOSE:2,CALCIUM:2 in the last 72 hours No results found for this basename: LABPT:2,INR:2 in the last 72 hours  Cons,alert, appropriate sitting up eating. Dressing left shoulder intact, arm and hand NMVI, slight swelling in hand.  Assessment/Plan: Post Op Left TSA doing very well, Plan-continue with current TXN and OT and SNF Monday.   Jamelle Rushing 06/26/2012, 8:59 AM

## 2012-06-26 NOTE — Progress Notes (Signed)
Clinical Social Work Department BRIEF PSYCHOSOCIAL ASSESSMENT 06/26/2012  Patient:  [name], [name]     Account Number:  [id number]     Admit date:  06/23/2012  Clinical Social Worker:  Skip Mayer  Date/Time:  06/26/2012 05:00 PM  Referred by:  Physician  Date Referred:  06/26/2012 Referred for  SNF Placement   Other Referral:   Interview type:  Patient Other interview type:    PSYCHOSOCIAL DATA Living Status:  ALONE Admitted from facility:   Level of care:   Primary support name:  janet Primary support relationship to patient:  CHILD, ADULT Degree of support available:   Adequate, per pt    CURRENT CONCERNS Current Concerns  Post-Acute Placement   Other Concerns:    SOCIAL WORK ASSESSMENT / PLAN CSW met with pt re: OT recommendation for ST SNF stay.  Pt reports she lives alone and her dtr travels often for work.  CSW explained placement process and answered questions.  Pt reports previous stay at [facility] in [facility] [place] and prefers this facility again.  CSW completed FL2 and initiated SNF search.  CSW left voice message for admissions coordinator at [facility] requesting she contact weekend CSW re: possible offer.  Anticipate d/c Monday.  Weekday CSW to f/u.   Assessment/plan status:  Information/Referral to [facility] Other assessment/ plan:   Information/referral to community resources:   SNF  PTAR    PATIENT'S/FAMILY'S RESPONSE TO PLAN OF CARE: Pt reports agreeable to ST SNF stay in order to increase strength and independence with mobility prior to return home alone.  Pt verbalize understanding of d/c plan and appreciation for CSW assist.        Dellie Burns, MSW, LCSWA [phone] (Weekends 8:00am-4:30pm)

## 2012-06-26 NOTE — Progress Notes (Signed)
Occupational Therapy Treatment Patient Details Name: [name] MRN: [id number] DOB: [birth year] Today's Date: 06/26/2012 Time: 4098-1191 OT Time Calculation (min): 23 min  OT Assessment / Plan / Recommendation Comments on Treatment Session Pt continues to require max instructions/cueing to adhere to shoulder precautions.  Pt very limited by pain and attempts to resist PROM exercises.  Spent significant amount of time explaining PROM technique and importance of maintaining precautions, but unsure how much pt retained. Continue to recommend SNF as safest d/c plan.    Follow Up Recommendations  Skilled nursing facility    Barriers to Discharge       Equipment Recommendations  3 in 1 bedside comode    Recommendations for Other Services    Frequency Min 3X/week   Plan Discharge plan remains appropriate    Precautions / Restrictions Precautions Precautions: Shoulder Type of Shoulder Precautions: Supple TSA protocol Precaution Booklet Issued: Yes (comment) Precaution Comments: reviewed all handouts with pt Required Braces or Orthoses: Other Brace/Splint Other Brace/Splint: L sling Restrictions Weight Bearing Restrictions: Yes LUE Weight Bearing: Non weight bearing   Pertinent Vitals/Pain See vitals    ADL  Upper Body Dressing: Simulated;Moderate assistance Where Assessed - Upper Body Dressing: Supine, head of bed up Equipment Used: Other (comment) (LUE sling) Transfers/Ambulation Related to ADLs: not assessed ADL Comments: Pt declining OOB mobility.  Pt donned/doffed LUE sling with mod assist and max verbal cueing for sequencing and technique. Pt also stating at start of session that she thinks she should go home tomorrow.  Discussed with pt her need for assistance with ADLs and further shoulder rehab before return home.  Reminded pt of fall just prior to sx.  After explaining benefits of ST SNF, pt more agreeable and stating "You know, I don't think I can take care of myself  just yet"    OT Diagnosis:    OT Problem List:   OT Treatment Interventions:     OT Goals Arm Goals Additional Arm Goal #1: Pt will tolerate LUE PROM HEP to specificed limits (ER,IR, Abduction, FF per Supple protocol). Arm Goal: Additional Goal #1 - Progress: Progressing toward goals Miscellaneous OT Goals Miscellaneous OT Goal #2: Pt will don/doff LUE sling with supervision. OT Goal: Miscellaneous Goal #2 - Progress: Progressing toward goals  Visit Information  Last OT Received On: 06/26/12 Assistance Needed: +1    Subjective Data      Prior Functioning       Cognition  Overall Cognitive Status: Impaired Area of Impairment: Memory;Safety/judgement Arousal/Alertness: Awake/alert Orientation Level: Appears intact for tasks assessed Behavior During Session: [facility] for tasks performed Memory: Decreased recall of precautions Safety/Judgement: Decreased awareness of safety precautions;Decreased awareness of need for assistance Safety/Judgement - Other Comments: Pt thinks she can go home alone despite need for assist while in hospital with all ADLs and functional mobility.    Mobility  Shoulder Instructions Bed Mobility Bed Mobility: Not assessed Transfers Transfers: Not assessed   Donning/doffing sling/immobilizer: Moderate assistance Correct positioning of sling/immobilizer: Maximal assistance ROM for elbow, wrist and digits of operated UE: Supervision/safety Sling wearing schedule (on at all times/off for ADL's): Moderate assistance Proper positioning of operated UE when showering: Moderate assistance Positioning of UE while sleeping: Minimal assistance   Exercises  Shoulder Exercises Shoulder Flexion: PROM;10 reps;Left (supine HOB elevated; to 70) Shoulder ABduction: PROM;Left;10 reps (supine HOB elevated; to 40) Shoulder External Rotation: PROM;Left;10 reps (supine HOB elevated; to 15-20) Elbow Flexion: AROM;Left;10 reps (supine HOB elevated) Elbow Extension:  AROM;Left;10 reps (supine HOB  elevated) Wrist Flexion: AROM;Left;10 reps (supine HOB elevated) Wrist Extension: AROM;Left;10 reps (supine HOB elevated) Digit Composite Flexion: AROM;Left;10 reps (supine HOB elevated) Composite Extension: AROM;Left;10 reps (supine HOB elevated) Neck Flexion: AROM;10 reps (supine HOB elevated) Neck Lateral Flexion - Right: AROM;10 reps (supine HOB elevated) Neck Lateral Flexion - Left: AROM;10 reps (supine HOB elevated)   Balance     End of Session OT - End of Session Equipment Utilized During Treatment:  (LUE sling) Activity Tolerance: Patient limited by pain Patient left: in bed;with call bell/phone within reach Nurse Communication: Mobility status  GO    06/26/2012 Cipriano Mile OTR/L Pager [phone] Office [phone]  Cipriano Mile 06/26/2012, 4:07 PM

## 2012-06-27 DIAGNOSIS — M19012 Primary osteoarthritis, left shoulder: Secondary | ICD-10-CM | POA: Diagnosis present

## 2012-06-27 DIAGNOSIS — M259 Joint disorder, unspecified: Secondary | ICD-10-CM | POA: Insufficient documentation

## 2012-06-27 MED ORDER — TRAMADOL HCL 50 MG PO TABS: 50.0000 mg | ORAL_TABLET | Freq: Four times a day (QID) | ORAL | Status: DC | PRN

## 2012-06-27 MED ORDER — OXYCODONE-ACETAMINOPHEN 5-325 MG PO TABS: 1.0000 | ORAL_TABLET | Freq: Four times a day (QID) | ORAL | Status: DC | PRN

## 2012-06-27 MED ORDER — DIAZEPAM 5 MG PO TABS: 5.0000 mg | ORAL_TABLET | Freq: Four times a day (QID) | ORAL | Status: DC | PRN

## 2012-06-27 NOTE — Discharge Summary (Signed)
PATIENT ID:      [name]  MRN:     [id number] DOB/AGE:    October 31, 1928 / 78 y.o.     DISCHARGE SUMMARY  ADMISSION DATE:    06/23/2012 DISCHARGE DATE:  06/27/2012  ADMISSION DIAGNOSIS: OA OF THE LEFT SHOULDER Past Medical History  Diagnosis Date  . Hypertension   . High cholesterol   . Mitral valve disorder   . Diverticulosis   . Blood dyscrasia     IGg  kappa  . GERD (gastroesophageal reflux disease)   . Arthritis     "bad; probably all over; lower back; hands, not RA" (06/23/2012)  . Osteoarthritis   . Fall at home     "fell in the bathtub yesterday; bruised my left arm" (06/23/2012)    DISCHARGE DIAGNOSIS:   Principal Problem:  *Arthritis of shoulder region, left   PROCEDURE: Procedure(s): TOTAL SHOULDER ARTHROPLASTY on 06/23/2012  CONSULTS:   none HISTORY:  See H&P in chart.  HOSPITAL COURSE:  [name] is a 78 y.o. admitted on 06/23/2012 with a chief complaint of left shoulder pain, and found to have a diagnosis of OA OF THE LEFT SHOULDER.  They were brought to the operating room on 06/23/2012 and underwent Procedure(s): TOTAL SHOULDER ARTHROPLASTY.    They were given perioperative antibiotics: Anti-infectives     Start     Dose/Rate Route Frequency Ordered Stop   06/23/12 1400   ceFAZolin (ANCEF) IVPB 1 g/50 mL premix        1 g 100 mL/hr over 30 Minutes Intravenous Every 6 hours 06/23/12 1253 06/24/12 0542   06/22/12 1510   ceFAZolin (ANCEF) IVPB 2 g/50 mL premix        2 g 100 mL/hr over 30 Minutes Intravenous 60 min pre-op 06/22/12 1510 06/23/12 0807        .  Patient underwent the above named procedure and tolerated it well. The following day they were hemodynamically stable and pain was controlled on oral analgesics. They were neurovascularly intact to the operative extremity. OT was ordered and worked with patient per protocol. Patient lived alone with daugther that travels and unable to assist so felt to be more appropriate for short term  nursing placement .Social work was consulted to assist in bed search and bed found. They were medically and orthopaedically stable for discharge on todays date .    DIAGNOSTIC STUDIES:  RECENT RADIOGRAPHIC STUDIES :  Dg Chest 2 View  06/15/2012  *RADIOLOGY REPORT*  Clinical Data: Preoperative respiratory exam for shoulder surgery. History of hypertension.  CHEST - 2 VIEW  Comparison: 12/11/2011  Findings: Heart size is normal.  The aorta is unfolded.  There is pulmonary scarring in the upper lobes and at the left base.  No sign of active infiltrate, mass, effusion or collapse.  Ordinary curvature degenerative change of the spine.  IMPRESSION: No active disease.  Pulmonary scarring in the upper lobes and left base.   Original Report Authenticated By: Thomasenia Sales, M.D.     RECENT VITAL SIGNS:  Patient Vitals for the past 24 hrs:  BP Temp Temp src Pulse Resp SpO2  06/27/12 0500 145/67 mmHg 98.5 F (36.9 C) Oral 82  16  95 %  06/26/12 2121 153/76 mmHg 98.3 F (36.8 C) Oral 83  18  95 %  06/26/12 1453 157/71 mmHg 99.2 F (37.3 C) Oral 92  18  99 %  .  RECENT EKG RESULTS:    Orders placed during the  hospital encounter of 08/24/11  . ED EKG  . ED EKG  . ED EKG  . ED EKG  . EKG    DISCHARGE INSTRUCTIONS:  Discharge Orders    Future Appointments: Provider: Department: Dept Phone: Center:   11/03/2012 3:00 PM Sharlotte Alamo [facility] [phone] None   11/03/2012 3:30 PM Josph Macho, MD [place] [phone] None     Future Orders Please Complete By Expires   Patient may shower      OT eval and treat   06/27/13   Scheduling Instructions:   OT for Left TSA protocol 30ER 60 ABD 90 FE PROM, pendulums, ok for AROM to elbow wrist and hand      DISCHARGE MEDICATIONS:     Medication List     As of 06/27/2012  7:59 AM    TAKE these medications         aspirin 500 MG EC tablet   Take 1,000 mg by mouth at bedtime  as needed. For pain      beta carotene w/minerals tablet   Take 1 tablet by mouth daily.      calcium citrate-vitamin D 315-200 MG-UNIT per tablet   Commonly known as: CITRACAL+D   Take 1 tablet by mouth daily.      COD LIVER OIL PO   Take 1 capsule by mouth daily.      diazepam 5 MG tablet   Commonly known as: VALIUM   Take 1 tablet (5 mg total) by mouth every 6 (six) hours as needed (spasm or sleep).      FLECTOR 1.3 % Ptch   Generic drug: diclofenac   Take 1 patch by mouth daily as needed. For pain      FORTEO [place]   Inject into the skin at bedtime.      furosemide 20 MG tablet   Commonly known as: LASIX   Take 20 mg by mouth daily.      gabapentin 300 MG capsule   Commonly known as: NEURONTIN   Take 300-600 mg by mouth 3 (three) times daily. Take one tablet in the morning and at noon. Take two tablet at night      lisinopril 20 MG tablet   Commonly known as: PRINIVIL,ZESTRIL   Take 20 mg by mouth daily.      oxyCODONE-acetaminophen 5-325 MG per tablet   Commonly known as: PERCOCET/ROXICET   Take 1-2 tablets by mouth every 6 (six) hours as needed (severe).      pantoprazole 40 MG tablet   Commonly known as: PROTONIX   Take 40 mg by mouth daily.      polyethylene glycol packet   Commonly known as: MIRALAX / GLYCOLAX   Take 17 g by mouth daily.      simvastatin 40 MG tablet   Commonly known as: ZOCOR   Take 40 mg by mouth at bedtime.      SYSTANE ULTRA PF 0.4-0.3 % Soln   Generic drug: Polyethyl Glycol-Propyl Glycol   Place 1 drop into both eyes 2 (two) times daily.      traMADol 50 MG tablet   Commonly known as: ULTRAM   Take 50 mg by mouth every 6 (six) hours as needed. For pain      traMADol 50 MG tablet   Commonly known as: ULTRAM   Take 1 tablet (50 mg total) by mouth every 6 (six) hours as needed for pain (  mild to moderate).      vitamin C 500 MG tablet   Commonly known as: ASCORBIC ACID   Take 500 mg by mouth daily.      Vitamin D 2000 UNITS  Caps   Take 2,000 Units by mouth daily.      vitamin E 400 UNIT capsule   Take 400 Units by mouth daily.        FOLLOW UP VISIT:       Follow-up Information    Follow up with SUPPLE,KEVIN M, MD. (call to be seen in 10-14 days)    Contact information:   [address], [address] [place] [address] [phone]          DISCHARGE TO: Skilled nursing facility   DISCHARGE CONDITION:  {Good  Syra Sirmons for Dr. Francena Hanly 06/27/2012, 7:59 AM

## 2012-06-27 NOTE — Clinical Social Work Placement (Signed)
     Clinical Social Work Department CLINICAL SOCIAL WORK PLACEMENT NOTE 06/27/2012  Patient:  [name], [name]  Account Number:  [id number] Admit date:  06/23/2012  Clinical Social Worker:  Lupita Leash Aaralynn Shepheard, LCSWA  Date/time:  06/27/2012 11:34 AM  Clinical Social Work is seeking post-discharge placement for this patient at the following level of care:   SKILLED NURSING   (*CSW will update this form in Epic as items are completed)     Patient/family provided with [facility] Department of Clinical Social Works list of facilities offering this level of care within the geographic area requested by the patient (or if unable, by the patients family).    Patient/family informed of their freedom to choose among providers that offer the needed level of care, that participate in Medicare, Medicaid or managed care program needed by the patient, have an available bed and are willing to accept the patient.    Patient/family informed of [facility] ownership interest in [facility], as well as of the fact that they are under no obligation to receive care at this facility.  PASARR submitted to EDS on 06/25/2012 PASARR number received from EDS on 06/25/2012  FL2 transmitted to all facilities in geographic area requested by pt/family on  06/25/2012 FL2 transmitted to all facilities within larger geographic area on   Patient informed that his/her managed care company has contracts with or will negotiate with  certain facilities, including the following:   NA- Pateint faxed out by weekend coverage.     Patient/family informed of bed offers received:  06/27/2012 Patient chooses bed at [facility] Physician recommends and patient chooses bed at    Patient to be transferred to [facility] [place] on  06/27/2012 Patient to be transferred to facility by ambulance [facility])  The following physician request were entered in Epic:   Additional  Comments: Pateint and daughter are pleased with d/c plan. Notified pt's nurse- Koleen Nimrod and SNF of d/c plan. No further CSW needs identified.

## 2012-06-27 NOTE — Progress Notes (Signed)
Occupational Therapy Treatment Patient Details Name: [name] MRN: [id number] DOB: [birth year] Today's Date: 06/27/2012 Time: 4098-1191 OT Time Calculation (min): 36 min  OT Assessment / Plan / Recommendation Comments on Treatment Session Needs max vc's for technique during shoulder exercises and pendulums. Pt seemed to understand to use RUE to lift LUE during supine exercises but tends to compensate using her torso to complete exercises with cues to use RUE to (A). Pt requires mod (A) to don/doff sling.    Follow Up Recommendations  Skilled nursing facility    Barriers to Discharge       Equipment Recommendations  3 in 1 bedside comode    Recommendations for Other Services    Frequency Min 3X/week   Plan Discharge plan remains appropriate    Precautions / Restrictions Precautions Precautions: Shoulder Type of Shoulder Precautions: Supple TSA protocol Required Braces or Orthoses: Other Brace/Splint Other Brace/Splint: L sling Restrictions Weight Bearing Restrictions: Yes LUE Weight Bearing: Non weight bearing   Pertinent Vitals/Pain Pain during exercises but no numeric score given.     ADL  Grooming: Performed;Teeth care;Wash/dry hands;Supervision/safety Where Assessed - Grooming: Unsupported standing Upper Body Dressing: Performed;Minimal assistance Where Assessed - Upper Body Dressing: Unsupported standing Lower Body Dressing: Performed;Minimal assistance Where Assessed - Lower Body Dressing: Supported sit to stand Toilet Transfer: Buyer, retail Method: Sit to Barista: Other (comment) (bed to chair) Equipment Used: Other (comment) (LUE sling) Transfers/Ambulation Related to ADLs: pt is supervision with transfers and ambulation in room due to slight unsteadiness ADL Comments: Pt in bathroom getting herself dressed upon arrival. Requires min (A) from caregiver to dress UB/LB. Pt and caregiver verbalized  understanding of dressing technique. Reviewed LUE exercises and pendulums. Requires max vc's for technique and support under elbow during FF. Pt requies mod (A) with donning sling but able to verbalize correct sequence.     OT Diagnosis:    OT Problem List:   OT Treatment Interventions:     OT Goals Acute Rehab OT Goals OT Goal Formulation: With patient Time For Goal Achievement: 07/08/12 Potential to Achieve Goals: Good ADL Goals Pt Will Perform Upper Body Bathing: with supervision;Sitting, chair;Sitting, edge of bed;Unsupported Pt Will Perform Lower Body Bathing: with supervision;Sit to stand from chair;Sit to stand from bed Pt Will Perform Upper Body Dressing: with supervision;Sitting, chair;Sitting, bed;Unsupported ADL Goal: Upper Body Dressing - Progress: Progressing toward goals Pt Will Perform Lower Body Dressing: with supervision;Sit to stand from chair;Sit to stand from bed;Unsupported ADL Goal: Lower Body Dressing - Progress: Progressing toward goals Pt Will Transfer to Toilet: with supervision;Ambulation;Comfort height toilet ADL Goal: Toilet Transfer - Progress: Met Pt Will Perform Toileting - Clothing Manipulation: with supervision;Sitting on 3-in-1 or toilet;Standing Pt Will Perform Toileting - Hygiene: with supervision;Sit to stand from 3-in-1/toilet;Standing at 3-in-1/toilet Arm Goals Additional Arm Goal #1: Pt will tolerate LUE PROM HEP to specificed limits (ER,IR, Abduction, FF per Supple protocol). Arm Goal: Additional Goal #1 - Progress: Progressing toward goals Miscellaneous OT Goals Miscellaneous OT Goal #1: Pt will perform pendulums 2-3 x daily with supervision. OT Goal: Miscellaneous Goal #1 - Progress: Progressing toward goals Miscellaneous OT Goal #2: Pt will don/doff LUE sling with supervision. OT Goal: Miscellaneous Goal #2 - Progress: Progressing toward goals  Visit Information  Last OT Received On: 06/27/12 Assistance Needed: +1    Subjective Data        Prior Functioning       Cognition  Overall Cognitive Status: Impaired Area of  Impairment: Memory;Safety/judgement Arousal/Alertness: Awake/alert Orientation Level: Appears intact for tasks assessed Behavior During Session: [facility] for tasks performed Memory: Decreased recall of precautions Safety/Judgement: Decreased awareness of safety precautions;Decreased awareness of need for assistance Safety/Judgement - Other Comments: Pt up and ambulating in room without (A) from staff, fall risk due to unsteady balance.    Mobility  Shoulder Instructions Bed Mobility Bed Mobility: Supine to Sit;Sit to Supine;Sitting - Scoot to Edge of Bed Supine to Sit: 5: Supervision;With rails;HOB flat Sitting - Scoot to Edge of Bed: 5: Supervision;With rail Sit to Supine: 5: Supervision;HOB flat Details for Bed Mobility Assistance: increased time to complete bed mobility Transfers Transfers: Sit to Stand;Stand to Sit Sit to Stand: 5: Supervision;With upper extremity assist;From bed Stand to Sit: 5: Supervision;With upper extremity assist;To bed Details for Transfer Assistance: Pt able to verbalize using RUE to push up from bed during sit<>stand    Donning/doffing shirt without moving shoulder: Minimal assistance Donning/doffing sling/immobilizer: Moderate assistance Correct positioning of sling/immobilizer: Moderate assistance Pendulum exercises (written home exercise program): Minimal assistance ROM for elbow, wrist and digits of operated UE: Supervision/safety Sling wearing schedule (on at all times/off for ADL's): Supervision/safety Positioning of UE while sleeping: Supervision/safety   Exercises  Shoulder Exercises Pendulum Exercise: PROM;Left;10 reps;Standing Shoulder Flexion: PROM;10 reps;Left Shoulder ABduction: PROM;Left;10 reps Shoulder External Rotation: PROM;Left;10 reps Elbow Flexion: AROM;Left;10 reps Elbow Extension: AROM;Left;10 reps Wrist Flexion: AROM;Left;10 reps Wrist  Extension: AROM;Left;10 reps Digit Composite Flexion: AROM;Left;10 reps Composite Extension: AROM;Left;10 reps Neck Flexion: AROM;10 reps Neck Extension: AROM;5 reps;Seated Neck Lateral Flexion - Right: AROM;10 reps Neck Lateral Flexion - Left: AROM;10 reps   Balance     End of Session OT - End of Session Equipment Utilized During Treatment: Gait belt Activity Tolerance: Patient limited by pain Patient left: in bed;with call bell/phone within reach;with nursing in room;with family/visitor present Nurse Communication: Mobility status  GO     Cleora Fleet 06/27/2012, 10:50 AM

## 2012-06-27 NOTE — Progress Notes (Signed)
I agree with the following treatment note after reviewing documentation.   Johnston, Kaian Fahs Brynn   OTR/L Pager: [phone] Office: [phone] .   

## 2012-10-10 IMAGING — CR DG RIBS W/ CHEST 3+V BILAT
5 series · 5 of 5 positions shown · non-contrast
Comparison: Plain film chest 08/26/2011.

CLINICAL DATA: Status post fall.  Rib pain.

BILATERAL RIBS AND CHEST - 4+ VIEW

[w chest pa]
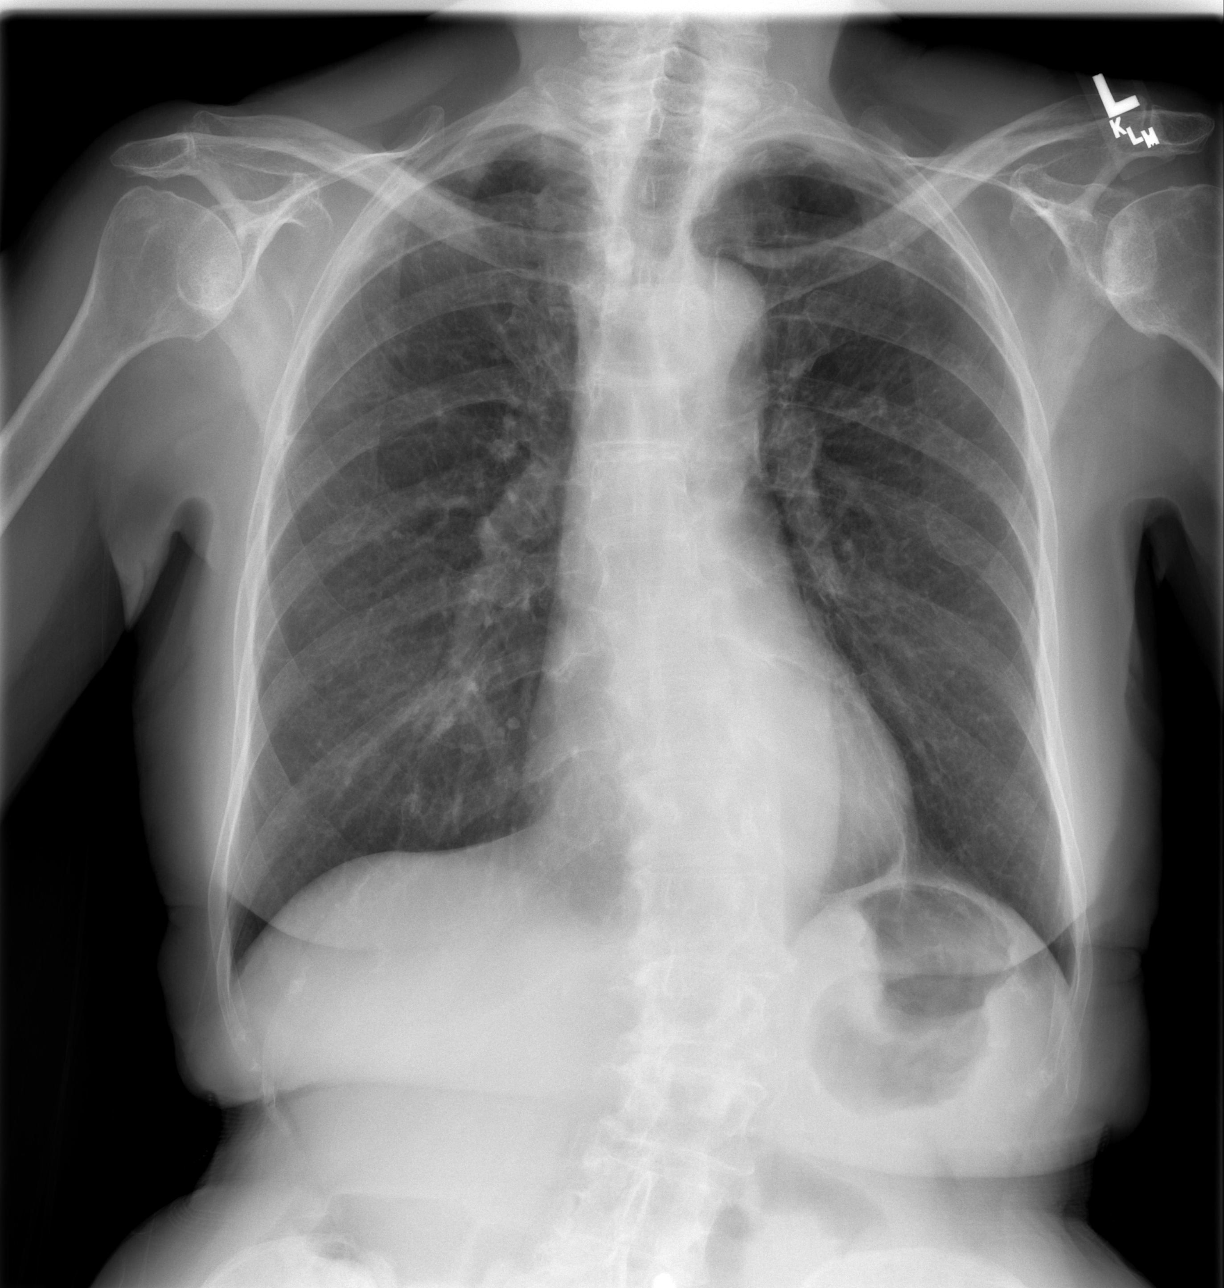

[w ribs ap/pa lower left]
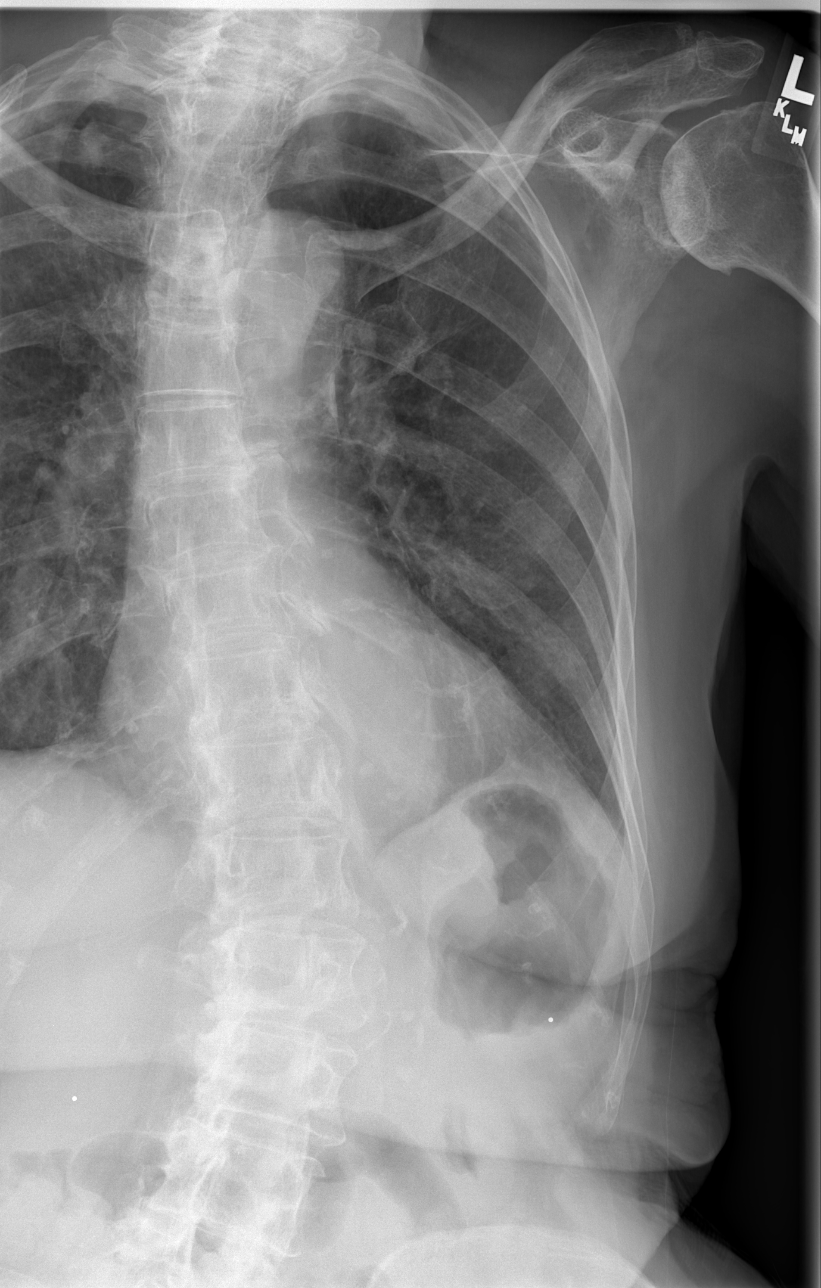

[w ribs oblique left]
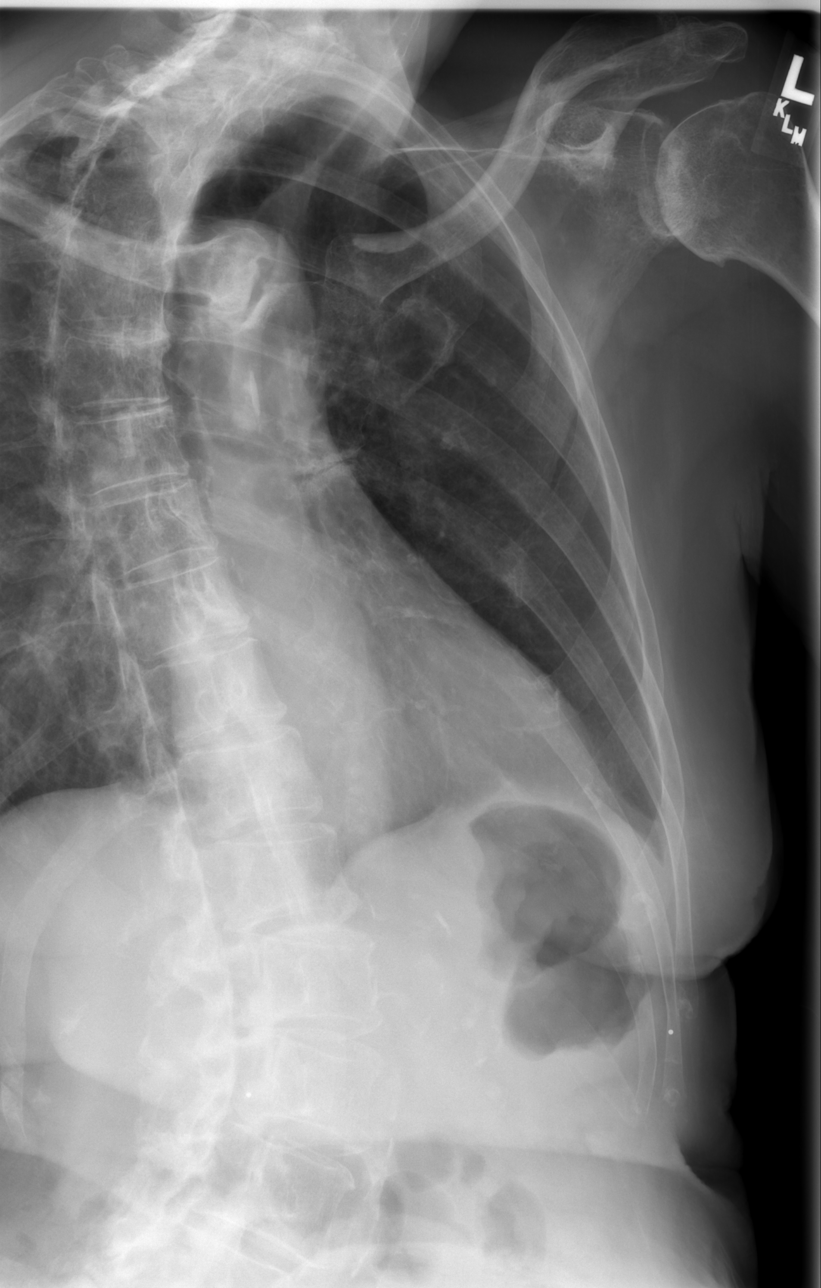

[w ribs ap/pa lower right]
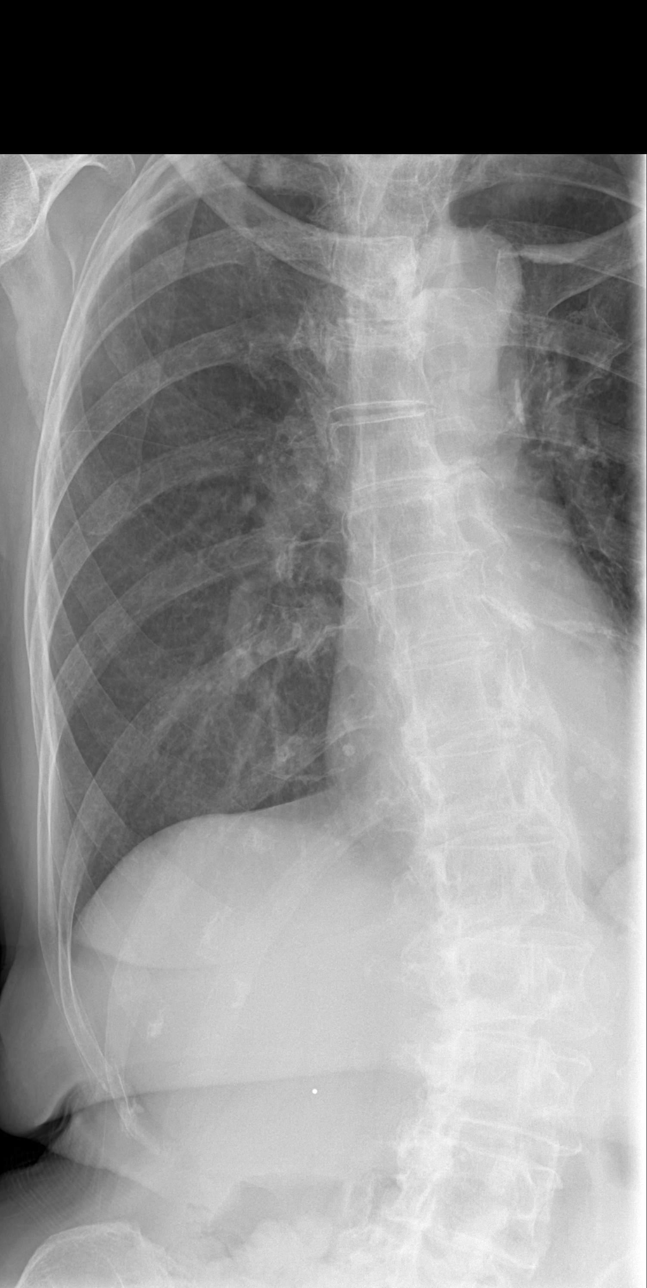

[w ribs oblique right]
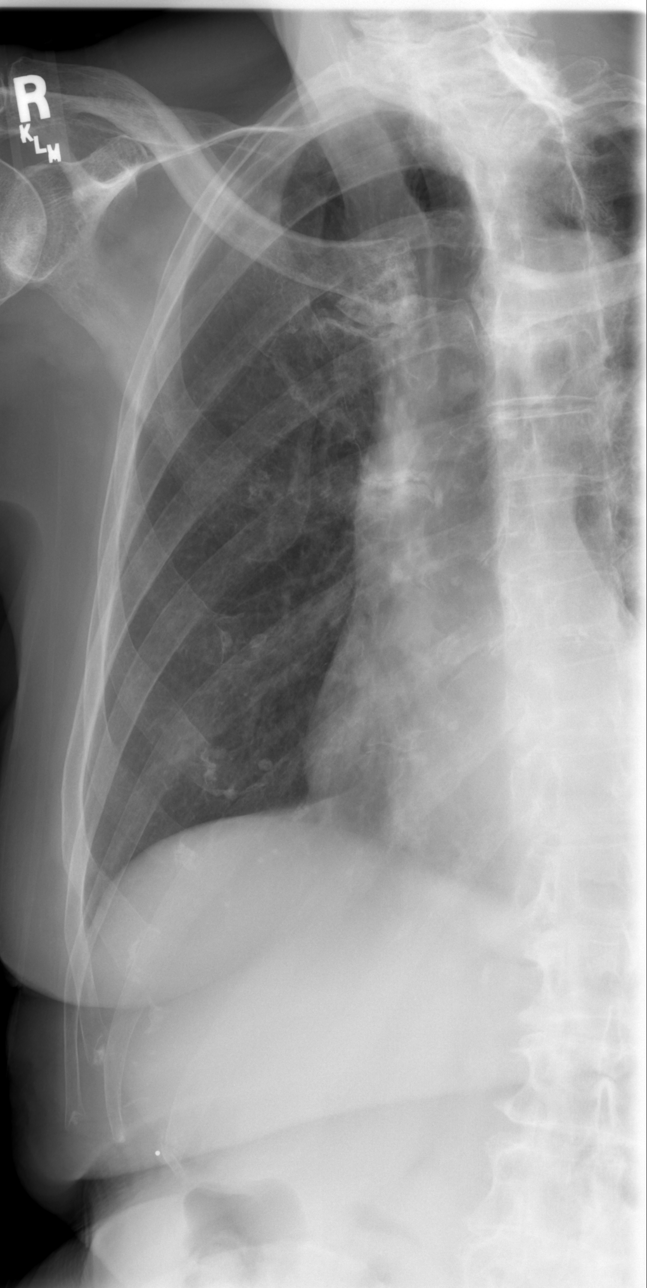

[5 of 5 positions shown; findings below may reference images not displayed]

FINDINGS: There is minimal left basilar atelectasis.  Apical
scarring is noted.  Lungs otherwise unremarkable.  No pneumothorax
or pleural effusion.  Heart size is normal.  There is no rib
fracture.  Scoliosis noted.
IMPRESSION: 1.  Negative for fracture other acute abnormality.
2.  Minimal left basilar atelectasis or scar.
3.  Scoliosis.

## 2012-10-18 ENCOUNTER — Telehealth: Payer: Self-pay | Admitting: Hematology & Oncology

## 2012-10-18 NOTE — Telephone Encounter (Signed)
Left pt message moved 3-13 to 4-10

## 2012-11-03 ENCOUNTER — Ambulatory Visit: Payer: Medicare Other | Admitting: Hematology & Oncology

## 2012-11-03 ENCOUNTER — Other Ambulatory Visit: Payer: Medicare Other | Admitting: Lab

## 2012-11-21 ENCOUNTER — Other Ambulatory Visit: Payer: Self-pay | Admitting: Gastroenterology

## 2012-11-21 DIAGNOSIS — R1013 Epigastric pain: Secondary | ICD-10-CM

## 2012-11-22 ENCOUNTER — Emergency Department (HOSPITAL_BASED_OUTPATIENT_CLINIC_OR_DEPARTMENT_OTHER)
Admission: EM | Admit: 2012-11-22 | Discharge: 2012-11-22 | Disposition: A | Discharge status: Home / Self Care | Payer: Medicare Other | Attending: Emergency Medicine | Admitting: Emergency Medicine

## 2012-11-22 ENCOUNTER — Encounter (HOSPITAL_BASED_OUTPATIENT_CLINIC_OR_DEPARTMENT_OTHER): Payer: Self-pay | Admitting: *Deleted

## 2012-11-22 ENCOUNTER — Emergency Department (HOSPITAL_BASED_OUTPATIENT_CLINIC_OR_DEPARTMENT_OTHER): Payer: Medicare Other

## 2012-11-22 DIAGNOSIS — Z8679 Personal history of other diseases of the circulatory system: Secondary | ICD-10-CM | POA: Insufficient documentation

## 2012-11-22 DIAGNOSIS — K219 Gastro-esophageal reflux disease without esophagitis: Secondary | ICD-10-CM | POA: Insufficient documentation

## 2012-11-22 DIAGNOSIS — Y9301 Activity, walking, marching and hiking: Secondary | ICD-10-CM | POA: Insufficient documentation

## 2012-11-22 DIAGNOSIS — S0180XA Unspecified open wound of other part of head, initial encounter: Secondary | ICD-10-CM | POA: Insufficient documentation

## 2012-11-22 DIAGNOSIS — S0181XA Laceration without foreign body of other part of head, initial encounter: Secondary | ICD-10-CM

## 2012-11-22 DIAGNOSIS — Z79899 Other long term (current) drug therapy: Secondary | ICD-10-CM | POA: Insufficient documentation

## 2012-11-22 DIAGNOSIS — Z862 Personal history of diseases of the blood and blood-forming organs and certain disorders involving the immune mechanism: Secondary | ICD-10-CM | POA: Insufficient documentation

## 2012-11-22 DIAGNOSIS — Y92009 Unspecified place in unspecified non-institutional (private) residence as the place of occurrence of the external cause: Secondary | ICD-10-CM | POA: Insufficient documentation

## 2012-11-22 DIAGNOSIS — S0990XA Unspecified injury of head, initial encounter: Secondary | ICD-10-CM

## 2012-11-22 DIAGNOSIS — W19XXXA Unspecified fall, initial encounter: Secondary | ICD-10-CM

## 2012-11-22 DIAGNOSIS — E7800 Pure hypercholesterolemia, unspecified: Secondary | ICD-10-CM | POA: Insufficient documentation

## 2012-11-22 DIAGNOSIS — W010XXA Fall on same level from slipping, tripping and stumbling without subsequent striking against object, initial encounter: Secondary | ICD-10-CM | POA: Insufficient documentation

## 2012-11-22 DIAGNOSIS — Z8739 Personal history of other diseases of the musculoskeletal system and connective tissue: Secondary | ICD-10-CM | POA: Insufficient documentation

## 2012-11-22 DIAGNOSIS — I10 Essential (primary) hypertension: Secondary | ICD-10-CM | POA: Insufficient documentation

## 2012-11-22 DIAGNOSIS — W1809XA Striking against other object with subsequent fall, initial encounter: Secondary | ICD-10-CM | POA: Insufficient documentation

## 2012-11-22 DIAGNOSIS — Z8719 Personal history of other diseases of the digestive system: Secondary | ICD-10-CM | POA: Insufficient documentation

## 2012-11-22 NOTE — ED Provider Notes (Signed)
History     CSN: [id number]  Arrival date & time 11/22/12  4098   First MD Initiated Contact with Patient 11/22/12 1945      Chief Complaint  Patient presents with  . Fall  . Head Injury    (Consider location/radiation/quality/duration/timing/severity/associated sxs/prior treatment) HPI Comments: Patient was in the backyard cleaning of her patio when she tripped on the gum tree balls that were on the concrete.  She fell and struck her head.  She denies loc but according to the neighbor who is with her at bedside, she was somewhat disoriented for a few minutes.  She feels fine now.  Only complaints are of mild headache and laceration above the left eye.  No neck pain.  Patient is a 79 y.o. female presenting with fall. The history is provided by the patient.  Fall The accident occurred less than 1 hour ago. The fall occurred while walking. She fell from a height of 1 to 2 ft. She landed on concrete. The volume of blood lost was minimal. The point of impact was the head. The pain is mild. She was ambulatory at the scene. Pertinent negatives include no visual change. The symptoms are aggravated by activity and standing.    Past Medical History  Diagnosis Date  . Hypertension   . High cholesterol   . Mitral valve disorder   . Diverticulosis   . Blood dyscrasia     IGg  kappa  . GERD (gastroesophageal reflux disease)   . Arthritis     "bad; probably all over; lower back; hands, not RA" (06/23/2012)  . Osteoarthritis   . Fall at home     "fell in the bathtub yesterday; bruised my left arm" (06/23/2012)    Past Surgical History  Procedure Laterality Date  . Laparoscopy  08/25/2011    Procedure: LAPAROSCOPY DIAGNOSTIC;  Surgeon: Rulon Abide, DO;  Location: [facility];  Service: General;  Laterality: N/A;  reduction obturator hernia obturator hernia repair  . Patella fracture surgery  2011    right  . Total shoulder arthroplasty  06/23/2012    left  . Vaginal hysterectomy  ~ 1996   . Hernia repair    . Cataract extraction w/ intraocular lens  implant, bilateral  ~ 2003  . Total shoulder arthroplasty  06/23/2012    Procedure: TOTAL SHOULDER ARTHROPLASTY;  Surgeon: Senaida Lange, MD;  Location: [facility] OR;  Service: Orthopedics;  Laterality: Left;  left total shoulder arthroplasty    History reviewed. No pertinent family history.  History  Substance Use Topics  . Smoking status: Never Smoker   . Smokeless tobacco: Never Used  . Alcohol Use: No    OB History   Grav Para Term Preterm Abortions TAB SAB Ect Mult Living                  Review of Systems  All other systems reviewed and are negative.    Allergies  Zyrtec  Home Medications   Current Outpatient Rx  Name  Route  Sig  Dispense  Refill  . aspirin 500 MG EC tablet   Oral   Take 1,000 mg by mouth at bedtime as needed. For pain         . beta carotene w/minerals (OCUVITE) tablet   Oral   Take 1 tablet by mouth daily.         . calcium citrate-vitamin D (CITRACAL+D) 315-200 MG-UNIT per tablet   Oral   Take 1 tablet by  mouth daily.          . Cholecalciferol (VITAMIN D) 2000 UNITS CAPS   Oral   Take 2,000 Units by mouth daily.           . COD LIVER OIL PO   Oral   Take 1 capsule by mouth daily.         Marland Kitchen FLECTOR 1.3 % PTCH   Oral   Take 1 patch by mouth daily as needed. For pain         . furosemide (LASIX) 20 MG tablet   Oral   Take 20 mg by mouth daily.           Marland Kitchen gabapentin (NEURONTIN) 300 MG capsule   Oral   Take 300-600 mg by mouth 3 (three) times daily. Take one tablet in the morning and at noon. Take two tablet at night         . lisinopril (PRINIVIL,ZESTRIL) 20 MG tablet   Oral   Take 20 mg by mouth daily.           . pantoprazole (PROTONIX) 40 MG tablet   Oral   Take 40 mg by mouth daily.          Bertram Gala Glycol-Propyl Glycol (SYSTANE ULTRA PF) 0.4-0.3 % SOLN   Both Eyes   Place 1 drop into both eyes 2 (two) times daily.          .  polyethylene glycol (MIRALAX / GLYCOLAX) packet   Oral   Take 17 g by mouth daily.           . simvastatin (ZOCOR) 40 MG tablet   Oral   Take 40 mg by mouth at bedtime.           . Teriparatide, Recombinant, (FORTEO [place])   Subcutaneous   Inject into the skin at bedtime.         . traMADol (ULTRAM) 50 MG tablet   Oral   Take 50 mg by mouth every 6 (six) hours as needed. For pain         . vitamin C (ASCORBIC ACID) 500 MG tablet   Oral   Take 500 mg by mouth daily.         . vitamin E 400 UNIT capsule   Oral   Take 400 Units by mouth daily.         . diazepam (VALIUM) 5 MG tablet   Oral   Take 1 tablet (5 mg total) by mouth every 6 (six) hours as needed (spasm or sleep).   30 tablet   1   . oxyCODONE-acetaminophen (PERCOCET/ROXICET) 5-325 MG per tablet   Oral   Take 1-2 tablets by mouth every 6 (six) hours as needed (severe).   50 tablet   0   . traMADol (ULTRAM) 50 MG tablet   Oral   Take 1 tablet (50 mg total) by mouth every 6 (six) hours as needed for pain (mild to moderate).   60 tablet   1     BP 117/63  Pulse 70  Temp(Src) 98 F (36.7 C) (Oral)  Resp 16  Ht 5\' 2"  (1.575 m)  Wt 124 lb (56.246 kg)  BMI 22.67 kg/m2  SpO2 99%  Physical Exam  Nursing note and vitals reviewed. Constitutional: She appears well-developed and well-nourished.  HENT:  Head: Normocephalic.  Mouth/Throat: Oropharynx is clear and moist.  There is a 2.5 cm laceration above the left eye.    Eyes:  EOM are normal. Pupils are equal, round, and reactive to light.  Neck: Normal range of motion. Neck supple.  Cardiovascular: Normal rate and regular rhythm.   No murmur heard. Pulmonary/Chest: Effort normal and breath sounds normal.  Abdominal: Soft. Bowel sounds are normal.  Musculoskeletal: Normal range of motion. She exhibits no edema.  Lymphadenopathy:    She has no cervical adenopathy.  Neurological: She is alert. No cranial nerve deficit. She exhibits normal muscle  tone. Coordination normal.  Skin: Skin is warm and dry. She is not diaphoretic.    ED Course  Procedures (including critical care time)  Labs Reviewed - No data to display No results found.   No diagnosis found.    MDM  The ct is unremarkable and the patient is neurologically intact.  The laceration is well-approximated and does not require sutures.  I believe will do fine with local wound care.  She appears appropriate for discharge, to return prn.        Geoffery Lyons, MD 11/22/12 2053

## 2012-11-22 NOTE — ED Notes (Signed)
Pt fell on debris from tree and hit head on cement, denies LOC. Abrasion to forehead and left knee

## 2012-11-23 ENCOUNTER — Encounter (HOSPITAL_BASED_OUTPATIENT_CLINIC_OR_DEPARTMENT_OTHER): Payer: Self-pay | Admitting: *Deleted

## 2012-11-23 ENCOUNTER — Emergency Department (HOSPITAL_BASED_OUTPATIENT_CLINIC_OR_DEPARTMENT_OTHER)
Admission: EM | Admit: 2012-11-23 | Discharge: 2012-11-23 | Disposition: A | Discharge status: Home / Self Care | Payer: Medicare Other | Attending: Emergency Medicine | Admitting: Emergency Medicine

## 2012-11-23 ENCOUNTER — Emergency Department (HOSPITAL_BASED_OUTPATIENT_CLINIC_OR_DEPARTMENT_OTHER): Payer: Medicare Other

## 2012-11-23 DIAGNOSIS — I10 Essential (primary) hypertension: Secondary | ICD-10-CM | POA: Insufficient documentation

## 2012-11-23 DIAGNOSIS — S62319A Displaced fracture of base of unspecified metacarpal bone, initial encounter for closed fracture: Secondary | ICD-10-CM | POA: Insufficient documentation

## 2012-11-23 DIAGNOSIS — Y939 Activity, unspecified: Secondary | ICD-10-CM | POA: Insufficient documentation

## 2012-11-23 DIAGNOSIS — Z9181 History of falling: Secondary | ICD-10-CM | POA: Insufficient documentation

## 2012-11-23 DIAGNOSIS — Z7982 Long term (current) use of aspirin: Secondary | ICD-10-CM | POA: Insufficient documentation

## 2012-11-23 DIAGNOSIS — E7800 Pure hypercholesterolemia, unspecified: Secondary | ICD-10-CM | POA: Insufficient documentation

## 2012-11-23 DIAGNOSIS — S62308A Unspecified fracture of other metacarpal bone, initial encounter for closed fracture: Secondary | ICD-10-CM

## 2012-11-23 DIAGNOSIS — K219 Gastro-esophageal reflux disease without esophagitis: Secondary | ICD-10-CM | POA: Insufficient documentation

## 2012-11-23 DIAGNOSIS — Y929 Unspecified place or not applicable: Secondary | ICD-10-CM | POA: Insufficient documentation

## 2012-11-23 DIAGNOSIS — W010XXA Fall on same level from slipping, tripping and stumbling without subsequent striking against object, initial encounter: Secondary | ICD-10-CM | POA: Insufficient documentation

## 2012-11-23 DIAGNOSIS — Z8739 Personal history of other diseases of the musculoskeletal system and connective tissue: Secondary | ICD-10-CM | POA: Insufficient documentation

## 2012-11-23 DIAGNOSIS — Z8679 Personal history of other diseases of the circulatory system: Secondary | ICD-10-CM | POA: Insufficient documentation

## 2012-11-23 DIAGNOSIS — Z79899 Other long term (current) drug therapy: Secondary | ICD-10-CM | POA: Insufficient documentation

## 2012-11-23 MED ORDER — HYDROCODONE-ACETAMINOPHEN 5-500 MG PO TABS: 1.0000 | ORAL_TABLET | Freq: Four times a day (QID) | ORAL | Status: DC | PRN

## 2012-11-23 NOTE — ED Notes (Signed)
Pt seen here yesterday for fall and return today for left hand swelling

## 2012-11-23 NOTE — ED Provider Notes (Signed)
History     CSN: [id number]  Arrival date & time 11/23/12  1506   First MD Initiated Contact with Patient 11/23/12 1519      Chief Complaint  Patient presents with  . Hand Injury    (Consider location/radiation/quality/duration/timing/severity/associated sxs/prior treatment) HPI Pt presenting with c/o pain in right hand.  Pt had a fall yesterday and was seen in the ED.  She states she did not realize she had pain in her hand until last night when it started swelling and hurting.  Pain worse with movement and palpation.  No weakness of hand, but limited by pain.  No new falls or injuries. Has not had any treatment prior to arrival for hand pain.  There are no other associated systemic symptoms, there are no other alleviating or modifying factors.   Past Medical History  Diagnosis Date  . Hypertension   . High cholesterol   . Mitral valve disorder   . Diverticulosis   . Blood dyscrasia     IGg  kappa  . GERD (gastroesophageal reflux disease)   . Arthritis     "bad; probably all over; lower back; hands, not RA" (06/23/2012)  . Osteoarthritis   . Fall at home     "fell in the bathtub yesterday; bruised my left arm" (06/23/2012)    Past Surgical History  Procedure Laterality Date  . Laparoscopy  08/25/2011    Procedure: LAPAROSCOPY DIAGNOSTIC;  Surgeon: Rulon Abide, DO;  Location: [facility];  Service: General;  Laterality: N/A;  reduction obturator hernia obturator hernia repair  . Patella fracture surgery  2011    right  . Total shoulder arthroplasty  06/23/2012    left  . Vaginal hysterectomy  ~ 1996  . Hernia repair    . Cataract extraction w/ intraocular lens  implant, bilateral  ~ 2003  . Total shoulder arthroplasty  06/23/2012    Procedure: TOTAL SHOULDER ARTHROPLASTY;  Surgeon: Senaida Lange, MD;  Location: [facility];  Service: Orthopedics;  Laterality: Left;  left total shoulder arthroplasty    History reviewed. No pertinent family history.  History  Substance Use  Topics  . Smoking status: Never Smoker   . Smokeless tobacco: Never Used  . Alcohol Use: No    OB History   Grav Para Term Preterm Abortions TAB SAB Ect Mult Living                  Review of Systems ROS reviewed and all otherwise negative except for mentioned in HPI  Allergies  Zyrtec  Home Medications   Current Outpatient Rx  Name  Route  Sig  Dispense  Refill  . aspirin 500 MG EC tablet   Oral   Take 1,000 mg by mouth at bedtime as needed. For pain         . beta carotene w/minerals (OCUVITE) tablet   Oral   Take 1 tablet by mouth daily.         . calcium citrate-vitamin D (CITRACAL+D) 315-200 MG-UNIT per tablet   Oral   Take 1 tablet by mouth daily.          . Cholecalciferol (VITAMIN D) 2000 UNITS CAPS   Oral   Take 2,000 Units by mouth daily.           . COD LIVER OIL PO   Oral   Take 1 capsule by mouth daily.         . diazepam (VALIUM) 5 MG tablet  Oral   Take 1 tablet (5 mg total) by mouth every 6 (six) hours as needed (spasm or sleep).   30 tablet   1   . FLECTOR 1.3 % PTCH   Oral   Take 1 patch by mouth daily as needed. For pain         . furosemide (LASIX) 20 MG tablet   Oral   Take 20 mg by mouth daily.           Marland Kitchen gabapentin (NEURONTIN) 300 MG capsule   Oral   Take 300-600 mg by mouth 3 (three) times daily. Take one tablet in the morning and at noon. Take two tablet at night         . HYDROcodone-acetaminophen (VICODIN) 5-500 MG per tablet   Oral   Take 1-2 tablets by mouth every 6 (six) hours as needed for pain.   15 tablet   0   . lisinopril (PRINIVIL,ZESTRIL) 20 MG tablet   Oral   Take 20 mg by mouth daily.           Marland Kitchen oxyCODONE-acetaminophen (PERCOCET/ROXICET) 5-325 MG per tablet   Oral   Take 1-2 tablets by mouth every 6 (six) hours as needed (severe).   50 tablet   0   . pantoprazole (PROTONIX) 40 MG tablet   Oral   Take 40 mg by mouth daily.          Bertram Gala Glycol-Propyl Glycol (SYSTANE  ULTRA PF) 0.4-0.3 % SOLN   Both Eyes   Place 1 drop into both eyes 2 (two) times daily.          . polyethylene glycol (MIRALAX / GLYCOLAX) packet   Oral   Take 17 g by mouth daily.           . simvastatin (ZOCOR) 40 MG tablet   Oral   Take 40 mg by mouth at bedtime.           . Teriparatide, Recombinant, (FORTEO [place])   Subcutaneous   Inject into the skin at bedtime.         . traMADol (ULTRAM) 50 MG tablet   Oral   Take 50 mg by mouth every 6 (six) hours as needed. For pain         . traMADol (ULTRAM) 50 MG tablet   Oral   Take 1 tablet (50 mg total) by mouth every 6 (six) hours as needed for pain (mild to moderate).   60 tablet   1   . vitamin C (ASCORBIC ACID) 500 MG tablet   Oral   Take 500 mg by mouth daily.         . vitamin E 400 UNIT capsule   Oral   Take 400 Units by mouth daily.           BP 113/63  Pulse 77  Temp(Src) 98.2 F (36.8 C) (Oral)  Resp 16  Ht 5\' 2"  (1.575 m)  Wt 124 lb (56.246 kg)  BMI 22.67 kg/m2  SpO2 100% Vitals reviewed Physical Exam Physical Examination: General appearance - alert, well appearing, and in no distress Mental status - alert, oriented to person, place, and time Eyes - no scleral icterus, no conjunctival injection Neck - supple, no midline tenderness to palpation Back exam - no midline tenderness to palpation Neurological - alert, oriented, normal speech, no focal findings or movement disorder noted Musculoskeletal - ttp over left dorsum of hand over 4th and 5th metacarpals with soft tissue  swelling, otherwise o joint tenderness, deformity or swelling, no anatomic snuffbox tenderness Extremities - peripheral pulses normal, no pedal edema, no clubbing or cyanosis Skin - normal coloration and turgor, no rashes, bruising over dorsum of left hand  ED Course  Procedures (including critical care time)  Labs Reviewed - No data to display Ct Head Wo Contrast  11/22/2012  *RADIOLOGY REPORT*  Clinical Data: Fall,  laceration/abrasion in left frontal region  CT HEAD WITHOUT CONTRAST  Technique:  Contiguous axial images were obtained from the base of the skull through the vertex without contrast.  Comparison: 05/21/2010  Findings: No evidence of parenchymal hemorrhage or extra-axial fluid collection. No mass lesion, mass effect, or midline shift.  No CT evidence of acute infarction.  Small vessel ischemic changes most prominent in the subcortical left frontal lobe.  Intracranial atherosclerosis.  Cerebral volume is age appropriate.  No ventriculomegaly.  The visualized paranasal sinuses are essentially clear. The mastoid air cells are unopacified.  Mild soft tissue swelling overlying the left frontal bone.  No evidence of calvarial fracture.  IMPRESSION: Mild soft tissue swelling overlying the left frontal bone.  No evidence of calvarial fracture.  No evidence of acute intracranial abnormality.  Small vessel ischemic changes with intracranial atherosclerosis.   Original Report Authenticated By: Charline Bills, M.D.    Dg Hand Complete Left  11/23/2012  *RADIOLOGY REPORT*  Clinical Data: Pain and swelling after fall.  LEFT HAND - COMPLETE 3+ VIEW  Comparison: None.  Findings: Minimally-displaced fracture involving distal portion of fourth metacarpal is noted.  Degenerative changes are seen involving the first carpometacarpal joint.  Osteophyte formation is seen involving the second, third, fourth and fifth distal interphalangeal joints.  IMPRESSION: Minimally-displaced fracture involving distal portion of fourth metacarpal.  Other findings are seen consistent with osteoarthritis.   Original Report Authenticated By: Lupita Raider.,  M.D.      1. Fracture of fourth metacarpal bone, closed, initial encounter       MDM  Pt presenting with pain in left hand after fall yesterday.  She has been evaluated in the ED yesterday for fall and then later in the day noted left hand swelling and pain. Fracture of distal 4th  metacarpal.  Xray images reviewed and interpreted by me as well.  Pt placed in ulnar gutter splint, given pain medication and will refer to hand for followup.  All results and plan d/w patient at the bedside.  Discharged with strict return precautions.  Pt agreeable with plan.        Ethelda Chick, MD 11/23/12 [phone]

## 2012-11-23 NOTE — ED Notes (Signed)
Tripped and fell yesterday, seen here yesterday for same.  Did not have left hand pain yesterday, but does today.  Left hand swollen and bruised. Limited ROM with left hand.

## 2012-11-25 ENCOUNTER — Other Ambulatory Visit: Payer: Medicare Other

## 2012-11-29 ENCOUNTER — Ambulatory Visit
Admission: RE | Admit: 2012-11-29 | Discharge: 2012-11-29 | Disposition: A | Discharge status: Home / Self Care | Payer: Medicare Other | Source: Ambulatory Visit | Attending: Gastroenterology | Admitting: Gastroenterology

## 2012-11-29 DIAGNOSIS — R1013 Epigastric pain: Secondary | ICD-10-CM

## 2012-12-01 ENCOUNTER — Ambulatory Visit (HOSPITAL_BASED_OUTPATIENT_CLINIC_OR_DEPARTMENT_OTHER): Payer: Medicare Other | Admitting: Hematology & Oncology

## 2012-12-01 ENCOUNTER — Other Ambulatory Visit (HOSPITAL_BASED_OUTPATIENT_CLINIC_OR_DEPARTMENT_OTHER): Payer: Medicare Other | Admitting: Lab

## 2012-12-01 VITALS — BP 121/60 | HR 71 | Temp 97.2°F | Resp 16 | Ht 62.0 in | Wt 125.0 lb

## 2012-12-01 DIAGNOSIS — D472 Monoclonal gammopathy: Secondary | ICD-10-CM

## 2012-12-01 LAB — CBC WITH DIFFERENTIAL (CANCER CENTER ONLY)
Eosinophils Absolute: 0.1 10*3/uL (ref 0.0–0.5)
MONO#: 0.9 10*3/uL (ref 0.1–0.9)
MONO%: 11.3 % (ref 0.0–13.0)
NEUT#: 5.1 10*3/uL (ref 1.5–6.5)
Platelets: 275 10*3/uL (ref 145–400)
RBC: 3.8 10*6/uL (ref 3.70–5.32)
WBC: 7.7 10*3/uL (ref 3.9–10.0)

## 2012-12-01 NOTE — Progress Notes (Signed)
This office note has been dictated.

## 2012-12-05 LAB — RETICULOCYTES (CHCC)
ABS Retic: 46.1 10*3/uL (ref 19.0–186.0)
Retic Ct Pct: 1.2 % (ref 0.4–2.3)

## 2012-12-05 LAB — COMPREHENSIVE METABOLIC PANEL
Albumin: 4.2 g/dL (ref 3.5–5.2)
Alkaline Phosphatase: 71 U/L (ref 39–117)
CO2: 29 mEq/L (ref 19–32)
Glucose, Bld: 81 mg/dL (ref 70–99)
Potassium: 4.6 mEq/L (ref 3.5–5.3)
Sodium: 138 mEq/L (ref 135–145)
Total Protein: 6.5 g/dL (ref 6.0–8.3)

## 2012-12-05 LAB — KAPPA/LAMBDA LIGHT CHAINS
Kappa free light chain: 3.28 mg/dL — ABNORMAL HIGH (ref 0.33–1.94)
Kappa:Lambda Ratio: 1.29 (ref 0.26–1.65)
Lambda Free Lght Chn: 2.55 mg/dL (ref 0.57–2.63)

## 2012-12-05 LAB — PROTEIN ELECTROPHORESIS, SERUM
Albumin ELP: 57.1 % (ref 55.8–66.1)
Alpha-1-Globulin: 5.3 % — ABNORMAL HIGH (ref 2.9–4.9)
Beta 2: 5.3 % (ref 3.2–6.5)
Total Protein, Serum Electrophoresis: 6.5 g/dL (ref 6.0–8.3)

## 2012-12-05 LAB — IGG, IGA, IGM: IgG (Immunoglobin G), Serum: 805 mg/dL (ref 690–1700)

## 2012-12-05 NOTE — Progress Notes (Signed)
CC:   Tina Nations, MD  DIAGNOSIS:  IgG kappa MGUS.  CURRENT THERAPY:  Observation.  INTERIM HISTORY:  [name] comes in for followup.  Unfortunately, she fell and broke her left wrist.  She has a splint on this.  She slipped on some debris out on her back patio.  She is on Forteo now for her osteoporosis.  She has had no problems with this.  When we last saw her back in September 2013, her monoclonal spike was only 0.32 gm/dL.  This has been holding steady for the past 4 or 5 years.  She has had no fever, sweats, or chills.  She has had no nausea or vomiting.  There has been no abdominal pain.  Of note, back in May 2013, she had a CT of the abdomen and pelvis ordered.  This was ordered by Gastroenterology.  There was a lesion in the right kidney measuring 1.7 cm.  Four years prior it measured 1.2 cm. I am sure if this is being followed or not.  PHYSICAL EXAMINATION:  General:  This is a petite, elderly white female in no obvious distress.  Vital signs:  Temperature of 97.2, pulse 71, respiratory rate 16, blood pressure 121/60.  Weight is 125.  Head and neck:  Normocephalic, atraumatic skull.  There are no ocular or oral lesions.  There are no palpable cervical or supraclavicular lymph nodes. Lungs:  Clear bilaterally.  Cardiac:  Regular rate and rhythm with a normal S1 and S2.  There are no murmurs, rubs, or bruits.  Abdomen: Soft.  She is slightly distended.  There is no fluid wave.  She has good bowel sounds.  There is no guarding or rebound tenderness.  There is no palpable hepatosplenomegaly.  Extremities:  Show no clubbing, cyanosis, or edema.  She does have the splint on her left wrist.  She has some osteoarthritic changes.  Neurological:  Shows no focal neurological deficits.  LABORATORY STUDIES:  White cell count is 7.7, hemoglobin 12.1, hematocrit 36.7, platelet count 275,000.  IMPRESSION:  [name] is an 79 year old white female with IgG kappa MGUS.  Again,  this has not been an issue for [place].  Of note, she did have an ultrasound done of the abdomen just last week. There was noted to be a cyst measuring 2 x 1.7 x 2.4 cm.  Again, this is what I suspect is on the CT scan.  From my point of view, I do not see any issues with the MGUS.  We will plan to get her back in 6 more months.    ______________________________ Josph Macho, M.D. PRE/MEDQ  D:  12/01/2012  T:  12/02/2012  Job:  [id number]

## 2013-01-15 ENCOUNTER — Emergency Department (HOSPITAL_BASED_OUTPATIENT_CLINIC_OR_DEPARTMENT_OTHER): Payer: Medicare Other

## 2013-01-15 ENCOUNTER — Emergency Department (HOSPITAL_BASED_OUTPATIENT_CLINIC_OR_DEPARTMENT_OTHER)
Admission: EM | Admit: 2013-01-15 | Discharge: 2013-01-15 | Disposition: A | Discharge status: Home / Self Care | Payer: Medicare Other | Attending: Emergency Medicine | Admitting: Emergency Medicine

## 2013-01-15 ENCOUNTER — Encounter (HOSPITAL_BASED_OUTPATIENT_CLINIC_OR_DEPARTMENT_OTHER): Payer: Self-pay | Admitting: *Deleted

## 2013-01-15 DIAGNOSIS — Z8679 Personal history of other diseases of the circulatory system: Secondary | ICD-10-CM | POA: Insufficient documentation

## 2013-01-15 DIAGNOSIS — M129 Arthropathy, unspecified: Secondary | ICD-10-CM | POA: Insufficient documentation

## 2013-01-15 DIAGNOSIS — S4980XA Other specified injuries of shoulder and upper arm, unspecified arm, initial encounter: Secondary | ICD-10-CM | POA: Insufficient documentation

## 2013-01-15 DIAGNOSIS — E7800 Pure hypercholesterolemia, unspecified: Secondary | ICD-10-CM | POA: Insufficient documentation

## 2013-01-15 DIAGNOSIS — Z862 Personal history of diseases of the blood and blood-forming organs and certain disorders involving the immune mechanism: Secondary | ICD-10-CM | POA: Insufficient documentation

## 2013-01-15 DIAGNOSIS — IMO0002 Reserved for concepts with insufficient information to code with codable children: Secondary | ICD-10-CM | POA: Insufficient documentation

## 2013-01-15 DIAGNOSIS — W1809XA Striking against other object with subsequent fall, initial encounter: Secondary | ICD-10-CM | POA: Insufficient documentation

## 2013-01-15 DIAGNOSIS — Z79899 Other long term (current) drug therapy: Secondary | ICD-10-CM | POA: Insufficient documentation

## 2013-01-15 DIAGNOSIS — Z8719 Personal history of other diseases of the digestive system: Secondary | ICD-10-CM | POA: Insufficient documentation

## 2013-01-15 DIAGNOSIS — Z9889 Other specified postprocedural states: Secondary | ICD-10-CM | POA: Insufficient documentation

## 2013-01-15 DIAGNOSIS — M19019 Primary osteoarthritis, unspecified shoulder: Secondary | ICD-10-CM | POA: Insufficient documentation

## 2013-01-15 DIAGNOSIS — Y9389 Activity, other specified: Secondary | ICD-10-CM | POA: Insufficient documentation

## 2013-01-15 DIAGNOSIS — I10 Essential (primary) hypertension: Secondary | ICD-10-CM | POA: Insufficient documentation

## 2013-01-15 DIAGNOSIS — S46909A Unspecified injury of unspecified muscle, fascia and tendon at shoulder and upper arm level, unspecified arm, initial encounter: Secondary | ICD-10-CM | POA: Insufficient documentation

## 2013-01-15 DIAGNOSIS — M7989 Other specified soft tissue disorders: Secondary | ICD-10-CM | POA: Insufficient documentation

## 2013-01-15 DIAGNOSIS — K219 Gastro-esophageal reflux disease without esophagitis: Secondary | ICD-10-CM | POA: Insufficient documentation

## 2013-01-15 DIAGNOSIS — Y9289 Other specified places as the place of occurrence of the external cause: Secondary | ICD-10-CM | POA: Insufficient documentation

## 2013-01-15 DIAGNOSIS — Z87828 Personal history of other (healed) physical injury and trauma: Secondary | ICD-10-CM | POA: Insufficient documentation

## 2013-01-15 MED ORDER — CEPHALEXIN 250 MG PO CAPS: 250.0000 mg | ORAL_CAPSULE | Freq: Four times a day (QID) | ORAL | Status: DC

## 2013-01-15 MED ORDER — HYDROCODONE-ACETAMINOPHEN 5-325 MG PO TABS: 1.0000 | ORAL_TABLET | Freq: Four times a day (QID) | ORAL | Status: DC | PRN

## 2013-01-15 NOTE — ED Notes (Signed)
Pt states she fell Thursday, trying to carry groceries. Fell on brick. Hit head. No LOC. PERRL. Also c/o right shoulder and right side neck pain. Also scraped right lower ext. Purulent drainage and redness noted to same. PMS intact.

## 2013-01-15 NOTE — ED Provider Notes (Addendum)
History     CSN: [id number]  Arrival date & time 01/15/13  1433   First MD Initiated Contact with Patient 01/15/13 1503      Chief Complaint  Patient presents with  . Fall    HPI Comments: The pain is in the right shoulder as well as in her back.  She has some redness and swelling in her right leg as well that started after she sustained an abrasion.  It hurts to try to lift her right arm.  No LOC.  No blood thinners.  Patient is a 79 y.o. female presenting with fall. The history is provided by the patient.  Fall This is a new problem. The current episode started more than 2 days ago. The problem occurs constantly. The problem has been gradually worsening. Pertinent negatives include no chest pain, no abdominal pain, no headaches and no shortness of breath. Exacerbated by: movement. Nothing relieves the symptoms.  Tet UTD  Past Medical History  Diagnosis Date  . Hypertension   . High cholesterol   . Mitral valve disorder   . Diverticulosis   . Blood dyscrasia     IGg  kappa  . GERD (gastroesophageal reflux disease)   . Arthritis     "bad; probably all over; lower back; hands, not RA" (06/23/2012)  . Osteoarthritis   . Fall at home     "fell in the bathtub yesterday; bruised my left arm" (06/23/2012)    Past Surgical History  Procedure Laterality Date  . Laparoscopy  08/25/2011    Procedure: LAPAROSCOPY DIAGNOSTIC;  Surgeon: Rulon Abide, DO;  Location: [facility];  Service: General;  Laterality: N/A;  reduction obturator hernia obturator hernia repair  . Patella fracture surgery  2011    right  . Total shoulder arthroplasty  06/23/2012    left  . Vaginal hysterectomy  ~ 1996  . Hernia repair    . Cataract extraction w/ intraocular lens  implant, bilateral  ~ 2003  . Total shoulder arthroplasty  06/23/2012    Procedure: TOTAL SHOULDER ARTHROPLASTY;  Surgeon: Senaida Lange, MD;  Location: [facility];  Service: Orthopedics;  Laterality: Left;  left total shoulder arthroplasty     History reviewed. No pertinent family history.  History  Substance Use Topics  . Smoking status: Never Smoker   . Smokeless tobacco: Never Used  . Alcohol Use: No    OB History   Grav Para Term Preterm Abortions TAB SAB Ect Mult Living                  Review of Systems  Respiratory: Negative for shortness of breath.   Cardiovascular: Negative for chest pain.  Gastrointestinal: Negative for abdominal pain.  Neurological: Negative for headaches.  All other systems reviewed and are negative.    Allergies  Zyrtec  Home Medications   Current Outpatient Rx  Name  Route  Sig  Dispense  Refill  . aspirin 500 MG EC tablet   Oral   Take 1,000 mg by mouth at bedtime as needed. For pain         . beta carotene w/minerals (OCUVITE) tablet   Oral   Take 1 tablet by mouth daily.         . calcium citrate-vitamin D (CITRACAL+D) 315-200 MG-UNIT per tablet   Oral   Take 1 tablet by mouth daily.          . Cholecalciferol (VITAMIN D) 2000 UNITS CAPS   Oral   Take  2,000 Units by mouth daily.           . COD LIVER OIL PO   Oral   Take 1 capsule by mouth daily.         . diazepam (VALIUM) 5 MG tablet   Oral   Take 1 tablet (5 mg total) by mouth every 6 (six) hours as needed (spasm or sleep).   30 tablet   1   . FLECTOR 1.3 % PTCH   Oral   Take 1 patch by mouth daily as needed. For pain         . furosemide (LASIX) 20 MG tablet   Oral   Take 20 mg by mouth daily.           Marland Kitchen gabapentin (NEURONTIN) 300 MG capsule   Oral   Take 300-600 mg by mouth 3 (three) times daily. Take one tablet in the morning and at noon. Take two tablet at night         . HYDROcodone-acetaminophen (NORCO) 5-325 MG per tablet   Oral   Take 1 tablet by mouth every 6 (six) hours as needed for pain.   16 tablet   0   . lisinopril (PRINIVIL,ZESTRIL) 20 MG tablet   Oral   Take 20 mg by mouth daily.           Marland Kitchen oxyCODONE-acetaminophen (PERCOCET/ROXICET) 5-325 MG per  tablet   Oral   Take 1-2 tablets by mouth every 6 (six) hours as needed (severe).   50 tablet   0   . pantoprazole (PROTONIX) 40 MG tablet   Oral   Take 40 mg by mouth daily.          Bertram Gala Glycol-Propyl Glycol (SYSTANE ULTRA PF) 0.4-0.3 % SOLN   Both Eyes   Place 1 drop into both eyes 2 (two) times daily.          . polyethylene glycol (MIRALAX / GLYCOLAX) packet   Oral   Take 17 g by mouth daily.           . simvastatin (ZOCOR) 40 MG tablet   Oral   Take 40 mg by mouth at bedtime.           . Teriparatide, Recombinant, (FORTEO [place])   Subcutaneous   Inject into the skin every morning.         . traMADol (ULTRAM) 50 MG tablet   Oral   Take 50 mg by mouth every 6 (six) hours as needed. For pain         . vitamin C (ASCORBIC ACID) 500 MG tablet   Oral   Take 500 mg by mouth daily.         . vitamin E 400 UNIT capsule   Oral   Take 400 Units by mouth daily.           BP 126/52  Pulse 70  Temp(Src) 98 F (36.7 C) (Oral)  Resp 20  Ht 5\' 2"  (1.575 m)  Wt 125 lb (56.7 kg)  BMI 22.86 kg/m2  SpO2 96%  Physical Exam  Nursing note and vitals reviewed. Constitutional: She appears well-developed and well-nourished. No distress.  HENT:  Head: Normocephalic and atraumatic.  Right Ear: External ear normal.  Left Ear: External ear normal.  Eyes: Conjunctivae are normal. Right eye exhibits no discharge. Left eye exhibits no discharge. No scleral icterus.  Neck: Neck supple. No tracheal deviation present.  Cardiovascular: Normal rate, regular rhythm and intact distal  pulses.   Pulmonary/Chest: Effort normal and breath sounds normal. No stridor. No respiratory distress. She has no wheezes. She has no rales.  Abdominal: Soft. Bowel sounds are normal. She exhibits no distension. There is no tenderness. There is no rebound and no guarding.  Musculoskeletal: She exhibits no edema and no tenderness.       Right shoulder: She exhibits decreased range of  motion, tenderness and bony tenderness. She exhibits no swelling and no deformity.       Left shoulder: She exhibits decreased range of motion, tenderness and bony tenderness. She exhibits no swelling.       Cervical back: Normal.       Thoracic back: Normal.       Lumbar back: Normal.  Right lower extrem, abrasion anterior proximal shin, mild erythema extending distally  Neurological: She is alert. She has normal strength. No sensory deficit. Cranial nerve deficit:  no gross defecits noted. She exhibits normal muscle tone. She displays no seizure activity. Coordination normal.  Skin: Skin is warm and dry. No rash noted.  Psychiatric: She has a normal mood and affect.    ED Course  Procedures (including critical care time)  Labs Reviewed - No data to display Dg Shoulder Right  01/15/2013   *RADIOLOGY REPORT*  Clinical Data: Larey Seat on Thursday.  Right-sided shoulder pain since injury.  Pain radiates into the neck.  RIGHT SHOULDER - 2+ VIEW  Comparison: 04/13/2012  Findings: There is degenerative change at the acromioclavicular joint.  No evidence for acute fracture.  Right lung apex is clear.  IMPRESSION:  1.  Degenerative changes. 2. No evidence for acute  abnormality.   Original Report Authenticated By: Norva Pavlov, M.D.   Dg Shoulder Left  01/15/2013   *RADIOLOGY REPORT*  Clinical Data: Patient fell on Thursday.  LEFT SHOULDER - 2+ VIEW  Comparison: None.  Findings: Left humeral prosthesis.  The prosthesis is intact. Degenerative changes are evident in the the glenohumeral joint with subchondral cyst formation.  There is also a surgical clip projecting across the glenoid on the frontal projection.  No fractures or bone destruction.  The acromioclavicular joint shows mild degenerative changes.  The contiguous ribs are normal.  IMPRESSION: No acute skeletal trauma.  Degenerative changes of the glenohumeral joint and acromioclavicular joint.  Left humeral prosthesis.   Original Report  Authenticated By: Sander Radon, M.D.     1. Degenerative arthritis of shoulder region, unspecified laterality       MDM  Patient's x-rays did not show any evidence of fracture or dislocation. The patient states she's currently taking Ultram for pain. I will give her a prescription for hydrocodone.   She may be developing early cellulitis with her leg erythema. Will start her on keflex     Celene Kras, MD 01/15/13 1647  Celene Kras, MD 01/15/13 [phone]

## 2013-01-28 DIAGNOSIS — M791 Myalgia, unspecified site: Secondary | ICD-10-CM | POA: Insufficient documentation

## 2013-01-28 DIAGNOSIS — K219 Gastro-esophageal reflux disease without esophagitis: Secondary | ICD-10-CM | POA: Insufficient documentation

## 2013-01-28 DIAGNOSIS — I059 Rheumatic mitral valve disease, unspecified: Secondary | ICD-10-CM | POA: Insufficient documentation

## 2013-01-28 DIAGNOSIS — R519 Headache, unspecified: Secondary | ICD-10-CM | POA: Insufficient documentation

## 2013-01-28 DIAGNOSIS — I279 Pulmonary heart disease, unspecified: Secondary | ICD-10-CM | POA: Insufficient documentation

## 2013-01-28 DIAGNOSIS — IMO0001 Reserved for inherently not codable concepts without codable children: Secondary | ICD-10-CM | POA: Insufficient documentation

## 2013-01-28 DIAGNOSIS — M069 Rheumatoid arthritis, unspecified: Secondary | ICD-10-CM | POA: Insufficient documentation

## 2013-01-28 DIAGNOSIS — G4733 Obstructive sleep apnea (adult) (pediatric): Secondary | ICD-10-CM | POA: Insufficient documentation

## 2013-01-28 DIAGNOSIS — G939 Disorder of brain, unspecified: Secondary | ICD-10-CM | POA: Insufficient documentation

## 2013-01-28 DIAGNOSIS — D72829 Elevated white blood cell count, unspecified: Secondary | ICD-10-CM | POA: Insufficient documentation

## 2013-03-07 DIAGNOSIS — B029 Zoster without complications: Secondary | ICD-10-CM | POA: Insufficient documentation

## 2013-03-07 DIAGNOSIS — R5381 Other malaise: Secondary | ICD-10-CM | POA: Insufficient documentation

## 2013-06-01 ENCOUNTER — Other Ambulatory Visit: Payer: Medicare Other | Admitting: Lab

## 2013-06-01 ENCOUNTER — Ambulatory Visit: Payer: Medicare Other | Admitting: Hematology & Oncology

## 2013-06-01 ENCOUNTER — Telehealth: Payer: Self-pay | Admitting: Hematology & Oncology

## 2013-06-01 NOTE — Telephone Encounter (Signed)
Patient came to apt late.  i called Amy and Amy stated to resch patient.  Patient resch apt for 07/17/13

## 2013-07-06 ENCOUNTER — Other Ambulatory Visit (HOSPITAL_BASED_OUTPATIENT_CLINIC_OR_DEPARTMENT_OTHER): Payer: Self-pay | Admitting: Obstetrics and Gynecology

## 2013-07-06 ENCOUNTER — Other Ambulatory Visit: Payer: Self-pay | Admitting: Internal Medicine

## 2013-07-06 DIAGNOSIS — Z1231 Encounter for screening mammogram for malignant neoplasm of breast: Secondary | ICD-10-CM

## 2013-07-07 ENCOUNTER — Ambulatory Visit (HOSPITAL_BASED_OUTPATIENT_CLINIC_OR_DEPARTMENT_OTHER): Payer: Medicare Other

## 2013-07-17 ENCOUNTER — Ambulatory Visit (HOSPITAL_BASED_OUTPATIENT_CLINIC_OR_DEPARTMENT_OTHER): Payer: Medicare Other | Admitting: Hematology & Oncology

## 2013-07-17 ENCOUNTER — Ambulatory Visit (HOSPITAL_BASED_OUTPATIENT_CLINIC_OR_DEPARTMENT_OTHER): Payer: Medicare Other | Admitting: Lab

## 2013-07-17 VITALS — BP 103/50 | HR 67 | Temp 98.5°F | Resp 14 | Ht 62.0 in | Wt 127.0 lb

## 2013-07-17 DIAGNOSIS — D472 Monoclonal gammopathy: Secondary | ICD-10-CM

## 2013-07-17 LAB — CBC WITH DIFFERENTIAL (CANCER CENTER ONLY)
Eosinophils Absolute: 0.2 10*3/uL (ref 0.0–0.5)
MONO#: 0.8 10*3/uL (ref 0.1–0.9)
MONO%: 9.8 % (ref 0.0–13.0)
NEUT#: 5.5 10*3/uL (ref 1.5–6.5)
Platelets: 226 10*3/uL (ref 145–400)
RBC: 3.64 10*6/uL — ABNORMAL LOW (ref 3.70–5.32)
WBC: 8.3 10*3/uL (ref 3.9–10.0)

## 2013-07-17 NOTE — Progress Notes (Signed)
This office note has been dictated.

## 2013-07-19 LAB — PROTEIN ELECTROPHORESIS, SERUM, WITH REFLEX
Alpha-1-Globulin: 4.7 % (ref 2.9–4.9)
Beta 2: 5 % (ref 3.2–6.5)
Beta Globulin: 6.8 % (ref 4.7–7.2)
Gamma Globulin: 14.2 % (ref 11.1–18.8)

## 2013-07-19 LAB — IGG, IGA, IGM
IgA: 266 mg/dL (ref 69–380)
IgG (Immunoglobin G), Serum: 832 mg/dL (ref 690–1700)

## 2013-07-19 LAB — KAPPA/LAMBDA LIGHT CHAINS
Kappa free light chain: 2.99 mg/dL — ABNORMAL HIGH (ref 0.33–1.94)
Lambda Free Lght Chn: 3.2 mg/dL — ABNORMAL HIGH (ref 0.57–2.63)

## 2013-07-25 ENCOUNTER — Telehealth: Payer: Self-pay | Admitting: *Deleted

## 2013-07-25 NOTE — Telephone Encounter (Signed)
Called patient to let her know that according to Dr. Myna Hidalgo there was no problems with her bloodwork.

## 2013-07-25 NOTE — Telephone Encounter (Signed)
Message copied by Anselm Jungling on Tue Jul 25, 2013 10:01 AM ------      Message from: Josph Macho      Created: Fri Jul 21, 2013  4:09 PM       Please call and let her know that her labs are still normal. I do not see a problem with her blood work. Thanks. Pete ------

## 2013-07-29 NOTE — Progress Notes (Signed)
DIAGNOSIS:  IgG kappa MGUS.  CURRENT THERAPY:  Observation.  INTERIM HISTORY:  [name] comes in for followup.  We last saw her back in April.  She has been doing okay.  When we last saw her, her monoclonal studies all were pretty stable.  Her monoclonal spike was 0.21 g/dL.  IgG level was 805 mg/dL.  Kappa light chain was 3.28 mg/dL.  She has felt well.  She has had no problems with fatigue or weakness. She has had no cough or shortness of breath.  She has had no fever, sweats, or chills.  There has been no change in bowel or bladder habits. She has had no rashes.  There has been no swelling.  PHYSICAL EXAMINATION:  General:  This is an elderly, petite white female in no obvious distress.  Vital Signs:  Temperature of 98.5, pulse 67, respiratory rate 14, blood pressure 103/50, weight is 127 pounds.  Head and Neck:  Normocephalic, atraumatic skull.  There are no ocular or oral lesions.  There are no palpable cervical or supraclavicular lymph nodes. Lungs:  Clear bilaterally.  Cardiac:  Regular rate and rhythm with a normal S1 and S2.  There are no murmurs, rubs, or bruits.  Abdomen: Soft.  She has good bowel sounds.  There is no fluid wave.  There is no palpable abdominal mass.  There is no palpable hepatosplenomegaly. Extremities:  Show osteoarthritic changes in her joints.  She has decent strength in her arms and legs.  She has no weakness.  Back:  Shows some osteoporotic changes in her spine.  Skin:  No rashes, ecchymosis, or petechia.  LABORATORY STUDIES:  White cell count is 8.3, hemoglobin 12, hematocrit 36.4, and platelet count 226.  IMPRESSION:  [name] is a very charming 79 year old white female patient with IgG kappa MGUS.  We have been following her for a couple of years.  From my point of view, I do not see any issues with the MGUS so far.  I do not see any prior problems with MGUS progressing.  We will plan to get her back in 6 more months.  I do not see a  need for any x-ray studies.  I certainly do not see a need for a bone marrow test.  Of note, she does need to have a family doctor.  We will see about making a referral down to Dr. Constance Goltz, so that [name] can get regular followup.   ______________________________ Josph Macho, M.D. PRE/MEDQ  D:  07/17/2013  T:  07/28/2013  Job:  [id number]

## 2013-08-09 ENCOUNTER — Ambulatory Visit (HOSPITAL_BASED_OUTPATIENT_CLINIC_OR_DEPARTMENT_OTHER)
Admission: RE | Admit: 2013-08-09 | Discharge: 2013-08-09 | Disposition: A | Discharge status: Home / Self Care | Payer: Medicare Other | Source: Ambulatory Visit | Attending: Obstetrics and Gynecology | Admitting: Obstetrics and Gynecology

## 2013-08-09 DIAGNOSIS — Z1231 Encounter for screening mammogram for malignant neoplasm of breast: Secondary | ICD-10-CM | POA: Insufficient documentation

## 2013-08-14 ENCOUNTER — Ambulatory Visit (HOSPITAL_BASED_OUTPATIENT_CLINIC_OR_DEPARTMENT_OTHER): Payer: Medicare Other

## 2013-09-19 ENCOUNTER — Ambulatory Visit (INDEPENDENT_AMBULATORY_CARE_PROVIDER_SITE_OTHER): Payer: Medicare Other | Admitting: Internal Medicine

## 2013-09-19 VITALS — BP 131/82 | HR 74 | Temp 97.6°F | Ht 60.0 in | Wt 126.0 lb

## 2013-09-19 DIAGNOSIS — I351 Nonrheumatic aortic (valve) insufficiency: Secondary | ICD-10-CM

## 2013-09-19 DIAGNOSIS — K219 Gastro-esophageal reflux disease without esophagitis: Secondary | ICD-10-CM

## 2013-09-19 DIAGNOSIS — Z90710 Acquired absence of both cervix and uterus: Secondary | ICD-10-CM

## 2013-09-19 DIAGNOSIS — M810 Age-related osteoporosis without current pathological fracture: Secondary | ICD-10-CM

## 2013-09-19 DIAGNOSIS — E785 Hyperlipidemia, unspecified: Secondary | ICD-10-CM

## 2013-09-19 DIAGNOSIS — I10 Essential (primary) hypertension: Secondary | ICD-10-CM

## 2013-09-19 DIAGNOSIS — I359 Nonrheumatic aortic valve disorder, unspecified: Secondary | ICD-10-CM

## 2013-09-19 NOTE — Patient Instructions (Signed)
See me in 3 weeks.    Bring all medication bottle next visit  Will refer to Dr. Stanford Breed  Sign for old records ffrom  Dr. Einar Gip

## 2013-09-19 NOTE — Progress Notes (Signed)
Subjective:    Patient ID: [name], female    DOB: Mar 11, 1929, 80 y.o.   MRN: [id number]  HPI [name] is a delightful pt here for primary care first visit.  She lives alone and tells me she is close to her brother [name] [phone].  She has a daughter [name] who lives in [place] and travels frequently  [phone]  PMH of aortic insufficiency  (formerly Dr. Einar Gip), MGUS (Dr. Marin Olp) djd, HTN, GERD, hyperlipidemia, Osteoporosis on (Forteo Dr. Ouida Sills).   She is S/P hysterectomy  She is concerned about her cardiology care.  She knows she has valve problems both aortic and apparently mitral and is not clear what cardiologist she has seen since.  Former primary  Dr. Gwenlyn Found  Pt did not bring meds today and is not clear about all her medications she takes.    She really is asymptomatic no chest pain, no SOB, no LE edema but would like referral to cardiologist.  Allergies  Allergen Reactions  . Zyrtec [Cetirizine Hcl] Hives   Past Medical History  Diagnosis Date  . Hypertension   . High cholesterol   . Mitral valve disorder   . Diverticulosis   . Blood dyscrasia     IGg  kappa  . GERD (gastroesophageal reflux disease)   . Arthritis     "bad; probably all over; lower back; hands, not RA" (06/23/2012)  . Osteoarthritis   . Fall at home     "fell in the bathtub yesterday; bruised my left arm" (06/23/2012)   Past Surgical History  Procedure Laterality Date  . Laparoscopy  08/25/2011    Procedure: LAPAROSCOPY DIAGNOSTIC;  Surgeon: Judieth Keens, DO;  Location: [facility];  Service: General;  Laterality: N/A;  reduction obturator hernia obturator hernia repair  . Patella fracture surgery  2011    right  . Total shoulder arthroplasty  06/23/2012    left  . Vaginal hysterectomy  ~ 1996  . Hernia repair    . Cataract extraction w/ intraocular lens  implant, bilateral  ~ 2003  . Total shoulder arthroplasty  06/23/2012    Procedure: TOTAL SHOULDER  ARTHROPLASTY;  Surgeon: Marin Shutter, MD;  Location: [facility];  Service: Orthopedics;  Laterality: Left;  left total shoulder arthroplasty   History   Social History  . Marital Status: Single    Spouse Name: N/A    Number of Children: N/A  . Years of Education: N/A   Occupational History  . Not on file.   Social History Main Topics  . Smoking status: Never Smoker   . Smokeless tobacco: Never Used  . Alcohol Use: No  . Drug Use: No  . Sexual Activity: Not Currently   Other Topics Concern  . Not on file   Social History Narrative  . No narrative on file   No family history on file. Patient Active Problem List   Diagnosis Date Noted  . Hyperlipidemia 09/19/2013  . GERD (gastroesophageal reflux disease) 09/19/2013  . S/P hysterectomy 09/19/2013  . Osteoporosis 09/19/2013  . Arthritis of shoulder region, left 06/27/2012  . AI (aortic insufficiency) 08/27/2011  . Arthritis 08/27/2011  . Hx-TIA (transient ischemic attack) 08/27/2011  . IgM monoclonal gammopathy of uncertain significance 08/26/2011  . Hypertension 08/26/2011  . Incarcerated femoral hernia 08/26/2011  . Elevated cholesterol 08/26/2011   Current Outpatient Prescriptions on File Prior to Visit  Medication Sig Dispense Refill  . aspirin 500 MG EC tablet Take 1,000 mg by  mouth at bedtime as needed. For pain      . calcium citrate-vitamin D (CITRACAL+D) 315-200 MG-UNIT per tablet Take 1 tablet by mouth daily.       . Cholecalciferol (VITAMIN D) 2000 UNITS CAPS Take 2,000 Units by mouth daily.        Marland Kitchen FLECTOR 1.3 % PTCH Take 1 patch by mouth daily as needed. For pain      . furosemide (LASIX) 20 MG tablet Take 20 mg by mouth daily.        Marland Kitchen gabapentin (NEURONTIN) 300 MG capsule Take 300-600 mg by mouth 3 (three) times daily. Take one tablet in the morning and at noon. Take two tablet at night      . hydrochlorothiazide (MICROZIDE) 12.5 MG capsule       . lisinopril (PRINIVIL,ZESTRIL) 20 MG tablet Take 20 mg by  mouth daily.        Marland Kitchen omeprazole (PRILOSEC) 20 MG capsule Take 20 mg by mouth daily.       . pantoprazole (PROTONIX) 40 MG tablet Take 40 mg by mouth daily.       Vladimir Faster Glycol-Propyl Glycol (SYSTANE ULTRA PF) 0.4-0.3 % SOLN Place 1 drop into both eyes 2 (two) times daily.       . potassium chloride (MICRO-K) 10 MEQ CR capsule Take 10 mEq by mouth daily.       . simvastatin (ZOCOR) 40 MG tablet Take 40 mg by mouth at bedtime.        . Teriparatide, Recombinant, (FORTEO [place]) Inject into the skin at bedtime.       . traMADol (ULTRAM) 50 MG tablet Take 50 mg by mouth every 6 (six) hours as needed. For pain      . vitamin C (ASCORBIC ACID) 500 MG tablet Take 500 mg by mouth daily.      . vitamin E 400 UNIT capsule Take 400 Units by mouth daily.      . beta carotene w/minerals (OCUVITE) tablet Take 1 tablet by mouth daily.      . COD LIVER OIL PO Take 1 capsule by mouth daily.      . diazepam (VALIUM) 5 MG tablet Take 1 tablet (5 mg total) by mouth every 6 (six) hours as needed (spasm or sleep).  30 tablet  1  . oxyCODONE-acetaminophen (PERCOCET/ROXICET) 5-325 MG per tablet Take 1-2 tablets by mouth every 6 (six) hours as needed (severe).  50 tablet  0  . polyethylene glycol (MIRALAX / GLYCOLAX) packet Take 17 g by mouth daily.         No current facility-administered medications on file prior to visit.       Review of Systems See HPI    Objective:   Physical Exam  Physical Exam  Nursing note and vitals reviewed.  Constitutional: She is oriented to person, place, and time. She appears well-developed and well-nourished.  HENT:  Head: Normocephalic and atraumatic.  Cardiovascular: Normal rate and regular rhythm. Soft 2/6 murmur aortic area.   Exam reveals no gallop and no friction rub.   Pulmonary/Chest: Breath sounds normal. She has no wheezes. She has no rales.  Neurological: She is alert and oriented to person, place, and time.  Skin: Skin is warm and dry.  Ext no  edema Psychiatric: She has a normal mood and affect. Her behavior is normal.             Assessment & Plan:  AI  Will try to get records  Dr. Theodis Sato. [place]  Will refer to cardiology as she does not wish to continue with [facility]  HTN continue mefds  Will get labs today  I am not at all sure she is taking meds correctly.    She is to bring all meds next visit  GERD on PPI   Hyperlipidemia on Zocor    Osteoporosis on forteo  MGUS   She is to see me in 3 weeks

## 2013-09-20 LAB — LIPID PANEL
CHOL/HDL RATIO: 5.3 ratio
Cholesterol: 237 mg/dL — ABNORMAL HIGH (ref 0–200)
HDL: 45 mg/dL (ref >39)
LDL Cholesterol: 158 mg/dL — ABNORMAL HIGH (ref 0–99)
Triglycerides: 172 mg/dL — ABNORMAL HIGH (ref <150)
VLDL: 34 mg/dL (ref 0–40)

## 2013-09-20 LAB — COMPREHENSIVE METABOLIC PANEL
ALBUMIN: 4.2 g/dL (ref 3.5–5.2)
ALT: 11 U/L (ref 0–35)
AST: 21 U/L (ref 0–37)
Alkaline Phosphatase: 61 U/L (ref 39–117)
BUN: 29 mg/dL — ABNORMAL HIGH (ref 6–23)
CALCIUM: 10.4 mg/dL (ref 8.4–10.5)
CHLORIDE: 96 meq/L (ref 96–112)
CO2: 32 meq/L (ref 19–32)
Creat: 0.91 mg/dL (ref 0.50–1.10)
Glucose, Bld: 67 mg/dL — ABNORMAL LOW (ref 70–99)
POTASSIUM: 4.4 meq/L (ref 3.5–5.3)
SODIUM: 138 meq/L (ref 135–145)
TOTAL PROTEIN: 7.1 g/dL (ref 6.0–8.3)
Total Bilirubin: 0.3 mg/dL (ref 0.3–1.2)

## 2013-09-20 LAB — CBC WITH DIFFERENTIAL/PLATELET
BASOS ABS: 0.1 10*3/uL (ref 0.0–0.1)
Basophils Relative: 1 % (ref 0–1)
Eosinophils Absolute: 0.1 10*3/uL (ref 0.0–0.7)
Eosinophils Relative: 2 % (ref 0–5)
HCT: 36.9 % (ref 36.0–46.0)
Hemoglobin: 12.4 g/dL (ref 12.0–15.0)
LYMPHS ABS: 1.8 10*3/uL (ref 0.7–4.0)
LYMPHS PCT: 23 % (ref 12–46)
MCH: 31.5 pg (ref 26.0–34.0)
MCHC: 33.6 g/dL (ref 30.0–36.0)
MCV: 93.7 fL (ref 78.0–100.0)
Monocytes Absolute: 0.9 10*3/uL (ref 0.1–1.0)
Monocytes Relative: 11 % (ref 3–12)
NEUTROS ABS: 4.9 10*3/uL (ref 1.7–7.7)
Neutrophils Relative %: 63 % (ref 43–77)
PLATELETS: 270 10*3/uL (ref 150–400)
RBC: 3.94 MIL/uL (ref 3.87–5.11)
RDW: 13.3 % (ref 11.5–15.5)
WBC: 7.8 10*3/uL (ref 4.0–10.5)

## 2013-09-20 LAB — TSH: TSH: 3.93 u[IU]/mL (ref 0.350–4.500)

## 2013-09-21 ENCOUNTER — Encounter: Payer: Self-pay | Admitting: *Deleted

## 2013-10-12 ENCOUNTER — Encounter (HOSPITAL_BASED_OUTPATIENT_CLINIC_OR_DEPARTMENT_OTHER): Payer: Self-pay

## 2013-10-12 ENCOUNTER — Other Ambulatory Visit: Payer: Self-pay | Admitting: Internal Medicine

## 2013-10-12 ENCOUNTER — Encounter: Payer: Self-pay | Admitting: Internal Medicine

## 2013-10-12 ENCOUNTER — Ambulatory Visit (INDEPENDENT_AMBULATORY_CARE_PROVIDER_SITE_OTHER): Payer: Medicare Other | Admitting: Internal Medicine

## 2013-10-12 ENCOUNTER — Ambulatory Visit (HOSPITAL_BASED_OUTPATIENT_CLINIC_OR_DEPARTMENT_OTHER)
Admission: RE | Admit: 2013-10-12 | Discharge: 2013-10-12 | Disposition: A | Discharge status: Home / Self Care | Payer: Medicare Other | Source: Ambulatory Visit | Attending: Internal Medicine | Admitting: Internal Medicine

## 2013-10-12 VITALS — BP 149/75 | HR 91 | Temp 98.0°F | Resp 18 | Wt 126.0 lb

## 2013-10-12 DIAGNOSIS — I351 Nonrheumatic aortic (valve) insufficiency: Secondary | ICD-10-CM

## 2013-10-12 DIAGNOSIS — K8689 Other specified diseases of pancreas: Secondary | ICD-10-CM

## 2013-10-12 DIAGNOSIS — K869 Disease of pancreas, unspecified: Secondary | ICD-10-CM

## 2013-10-12 DIAGNOSIS — K863 Pseudocyst of pancreas: Principal | ICD-10-CM

## 2013-10-12 DIAGNOSIS — M479 Spondylosis, unspecified: Secondary | ICD-10-CM | POA: Insufficient documentation

## 2013-10-12 DIAGNOSIS — X58XXXA Exposure to other specified factors, initial encounter: Secondary | ICD-10-CM | POA: Insufficient documentation

## 2013-10-12 DIAGNOSIS — I700 Atherosclerosis of aorta: Secondary | ICD-10-CM | POA: Insufficient documentation

## 2013-10-12 DIAGNOSIS — N281 Cyst of kidney, acquired: Secondary | ICD-10-CM | POA: Insufficient documentation

## 2013-10-12 DIAGNOSIS — K4090 Unilateral inguinal hernia, without obstruction or gangrene, not specified as recurrent: Secondary | ICD-10-CM | POA: Insufficient documentation

## 2013-10-12 DIAGNOSIS — I359 Nonrheumatic aortic valve disorder, unspecified: Secondary | ICD-10-CM

## 2013-10-12 DIAGNOSIS — K862 Cyst of pancreas: Secondary | ICD-10-CM | POA: Insufficient documentation

## 2013-10-12 DIAGNOSIS — E785 Hyperlipidemia, unspecified: Secondary | ICD-10-CM

## 2013-10-12 DIAGNOSIS — K7689 Other specified diseases of liver: Secondary | ICD-10-CM | POA: Insufficient documentation

## 2013-10-12 DIAGNOSIS — I10 Essential (primary) hypertension: Secondary | ICD-10-CM

## 2013-10-12 DIAGNOSIS — S32509A Unspecified fracture of unspecified pubis, initial encounter for closed fracture: Secondary | ICD-10-CM | POA: Insufficient documentation

## 2013-10-12 MED ORDER — IOHEXOL 350 MG/ML SOLN
100.0000 mL | Freq: Once | INTRAVENOUS | Status: AC | PRN
  Administered 2013-10-12: 100 mL via INTRAVENOUS

## 2013-10-12 MED ORDER — SIMVASTATIN 20 MG PO TABS: 20.0000 mg | ORAL_TABLET | Freq: Every day | ORAL | Status: DC

## 2013-10-12 NOTE — Progress Notes (Signed)
Subjective:    Patient ID: [name], female    DOB: 12/07/1928, 80 y.o.   MRN: [id number]  HPI [name] is here for follow up.    She brings a grocery bag full of medication.  See med list  I note she is on two diuretics both lasix and HCTZ  She is out of her simvastatin and has not taken in several months.    See lipids  See abd ultrasound  of 11/2012.  She has new pancreatic nodule.  She is asymptomatic.  She does use omeprazole for GERd and she is also taking align which she says  "keeps me regular"   Allergies  Allergen Reactions  . Zyrtec [Cetirizine Hcl] Hives   Past Medical History  Diagnosis Date  . Hypertension   . High cholesterol   . Mitral valve disorder   . Diverticulosis   . Blood dyscrasia     IGg  kappa  . GERD (gastroesophageal reflux disease)   . Arthritis     "bad; probably all over; lower back; hands, not RA" (06/23/2012)  . Osteoarthritis   . Fall at home     "fell in the bathtub yesterday; bruised my left arm" (06/23/2012)   Past Surgical History  Procedure Laterality Date  . Laparoscopy  08/25/2011    Procedure: LAPAROSCOPY DIAGNOSTIC;  Surgeon: Judieth Keens, DO;  Location: [facility];  Service: General;  Laterality: N/A;  reduction obturator hernia obturator hernia repair  . Patella fracture surgery  2011    right  . Total shoulder arthroplasty  06/23/2012    left  . Vaginal hysterectomy  ~ 1996  . Hernia repair    . Cataract extraction w/ intraocular lens  implant, bilateral  ~ 2003  . Total shoulder arthroplasty  06/23/2012    Procedure: TOTAL SHOULDER ARTHROPLASTY;  Surgeon: Marin Shutter, MD;  Location: [facility];  Service: Orthopedics;  Laterality: Left;  left total shoulder arthroplasty   History   Social History  . Marital Status: Single    Spouse Name: N/A    Number of Children: N/A  . Years of Education: N/A   Occupational History  . Not on file.   Social History Main Topics  . Smoking status: Never Smoker   . Smokeless  tobacco: Never Used  . Alcohol Use: No  . Drug Use: No  . Sexual Activity: Not Currently   Other Topics Concern  . Not on file   Social History Narrative  . No narrative on file   History reviewed. No pertinent family history. Patient Active Problem List   Diagnosis Date Noted  . Pancreatic mass 10/12/2013  . Hyperlipidemia 09/19/2013  . GERD (gastroesophageal reflux disease) 09/19/2013  . S/P hysterectomy 09/19/2013  . Osteoporosis 09/19/2013  . Arthritis of shoulder region, left 06/27/2012  . AI (aortic insufficiency) 08/27/2011  . Arthritis 08/27/2011  . Hx-TIA (transient ischemic attack) 08/27/2011  . IgM monoclonal gammopathy of uncertain significance 08/26/2011  . Hypertension 08/26/2011  . Incarcerated femoral hernia 08/26/2011  . Elevated cholesterol 08/26/2011   Current Outpatient Prescriptions on File Prior to Visit  Medication Sig Dispense Refill  . aspirin 500 MG EC tablet Take 1,000 mg by mouth at bedtime as needed. For pain      . beta carotene w/minerals (OCUVITE) tablet Take 1 tablet by mouth daily.      . calcium citrate-vitamin D (CITRACAL+D) 315-200 MG-UNIT per tablet Take 1 tablet by mouth daily.       Marland Kitchen  Cholecalciferol (VITAMIN D) 2000 UNITS CAPS Take 5,000 Units by mouth daily.       . COD LIVER OIL PO Take 1 capsule by mouth daily.      Marland Kitchen FLECTOR 1.3 % PTCH Take 1 patch by mouth daily as needed. For pain      . furosemide (LASIX) 20 MG tablet Take 20 mg by mouth daily.        Marland Kitchen gabapentin (NEURONTIN) 300 MG capsule Take 300-600 mg by mouth 3 (three) times daily. Take one tablet in the morning and at noon. Take two tablet at night      . lisinopril (PRINIVIL,ZESTRIL) 20 MG tablet Take 20 mg by mouth daily.        Marland Kitchen omeprazole (PRILOSEC) 20 MG capsule Take 20 mg by mouth daily.       . pantoprazole (PROTONIX) 40 MG tablet Take 40 mg by mouth daily.       Vladimir Faster Glycol-Propyl Glycol (SYSTANE ULTRA PF) 0.4-0.3 % SOLN Place 1 drop into both eyes 2 (two)  times daily.       . potassium chloride (MICRO-K) 10 MEQ CR capsule Take 10 mEq by mouth daily.       . Teriparatide, Recombinant, (FORTEO [place]) Inject into the skin at bedtime.       . traMADol (ULTRAM) 50 MG tablet Take 50 mg by mouth every 6 (six) hours as needed. For pain      . vitamin C (ASCORBIC ACID) 500 MG tablet Take 500 mg by mouth daily.      . vitamin E 400 UNIT capsule Take 400 Units by mouth daily.      . polyethylene glycol (MIRALAX / GLYCOLAX) packet Take 17 g by mouth daily.         No current facility-administered medications on file prior to visit.       Review of Systems See HPI    Objective:   Physical Exam Physical Exam  Nursing note and vitals reviewed.  Constitutional: She is oriented to person, place, and time. She appears well-developed and well-nourished.  HENT:  Head: Normocephalic and atraumatic.  Cardiovascular: Normal rate and regular rhythm. Exam reveals no gallop and no friction rub.  No murmur heard.  Pulmonary/Chest: Breath sounds normal. She has no wheezes. She has no rales.  Neurological: She is alert and oriented to person, place, and time.  Skin: Skin is warm and dry.  Ext no edema Psychiatric: She has a normal mood and affect. Her behavior is normal.        Assessment & Plan:  HTN  Will D/C  hctz and continue Furosemide .  Advised ot be sure to take her K with her lasix  Hyperlipidemia  Given advanced age will lower dose of statin to Zocor 20 mg  Will rehcheck fasting lipids in 3 months  Pancreatic nodule.  Will get CT with pancreatic protocol  AI :   She has upcoming appt with Dr. Stanford Breed  MGUS   Osteoporosis  She is on Forteo with Dr. Ouida Sills  See me in 4 weeks after  CT done

## 2013-10-12 NOTE — Patient Instructions (Addendum)
See me in 4-5 weeks  30 min visit  Stop by xray to schedule CT scan

## 2013-10-17 ENCOUNTER — Telehealth: Payer: Self-pay | Admitting: Internal Medicine

## 2013-10-17 DIAGNOSIS — K8020 Calculus of gallbladder without cholecystitis without obstruction: Secondary | ICD-10-CM

## 2013-10-17 NOTE — Telephone Encounter (Signed)
Spoke with pt and informed of CT results   Multiple organ cystic lesions, liver, Right kidney,  Pancreas,    Informed of gallbladder stone  Advised to keep follow up with me

## 2013-10-31 ENCOUNTER — Encounter: Payer: Self-pay | Admitting: Cardiology

## 2013-11-01 ENCOUNTER — Encounter: Payer: Self-pay | Admitting: Cardiology

## 2013-11-01 ENCOUNTER — Ambulatory Visit (INDEPENDENT_AMBULATORY_CARE_PROVIDER_SITE_OTHER): Payer: Medicare Other | Admitting: Cardiology

## 2013-11-01 ENCOUNTER — Encounter: Payer: Self-pay | Admitting: *Deleted

## 2013-11-01 VITALS — BP 140/82 | HR 67 | Ht 61.0 in | Wt 130.1 lb

## 2013-11-01 DIAGNOSIS — I10 Essential (primary) hypertension: Secondary | ICD-10-CM

## 2013-11-01 DIAGNOSIS — E785 Hyperlipidemia, unspecified: Secondary | ICD-10-CM

## 2013-11-01 DIAGNOSIS — I351 Nonrheumatic aortic (valve) insufficiency: Secondary | ICD-10-CM

## 2013-11-01 DIAGNOSIS — I359 Nonrheumatic aortic valve disorder, unspecified: Secondary | ICD-10-CM

## 2013-11-01 NOTE — Assessment & Plan Note (Signed)
Continue statin. 

## 2013-11-01 NOTE — Assessment & Plan Note (Signed)
Patient has had aortic and mitral regurgitation previously. She apparently was seen for question valve surgery in the past but I do not have those records available. Most recent echocardiogram showed mild regurgitation. We will repeat echocardiogram. I have explained that if her aortic and mitral regurgitation are mild then no intervention would be required. Note she is asymptomatic.

## 2013-11-01 NOTE — Patient Instructions (Signed)

## 2013-11-01 NOTE — Progress Notes (Signed)
HPI: 80 year old female for evaluation of aortic insufficiency. Echocardiogram in [facility] in December of 2012 showed normal LV function, mild left atrial enlargement, mild aortic and mitral regurgitation and mild to moderate tricuspid regurgitation. Patient apparently was told some years ago that she may require surgery but feels as though she has had discrepant information. She denies dyspnea on exertion, orthopnea, PND, palpitations, syncope or chest pain. Occasional minimal pedal edema.  Current Outpatient Prescriptions  Medication Sig Dispense Refill  . aspirin 500 MG EC tablet Take 1,000 mg by mouth at bedtime as needed. For pain      . beta carotene w/minerals (OCUVITE) tablet Take 1 tablet by mouth daily.      . calcium citrate-vitamin D (CITRACAL+D) 315-200 MG-UNIT per tablet Take 1 tablet by mouth daily.       . Cholecalciferol (VITAMIN D) 2000 UNITS CAPS Take 5,000 Units by mouth daily.       . COD LIVER OIL PO Take 1 capsule by mouth daily as needed.       Marland Kitchen FLECTOR 1.3 % PTCH Take 1 patch by mouth daily as needed. For pain      . furosemide (LASIX) 20 MG tablet Take 20 mg by mouth daily.        Marland Kitchen gabapentin (NEURONTIN) 300 MG capsule Take 300-600 mg by mouth 3 (three) times daily. Take one tablet in the morning and at noon. Take two tablet at night      . lisinopril (PRINIVIL,ZESTRIL) 20 MG tablet Take 20 mg by mouth daily.        Marland Kitchen omeprazole (PRILOSEC) 20 MG capsule Take 20 mg by mouth daily.       . pantoprazole (PROTONIX) 40 MG tablet Take 40 mg by mouth daily.       Vladimir Faster Glycol-Propyl Glycol (SYSTANE ULTRA PF) 0.4-0.3 % SOLN Place 1 drop into both eyes 2 (two) times daily.       . polyethylene glycol (MIRALAX / GLYCOLAX) packet Take 17 g by mouth daily as needed.       . potassium chloride (MICRO-K) 10 MEQ CR capsule Take 10 mEq by mouth daily.       . Probiotic Product (ALIGN) 4 MG CAPS Take by mouth. Once daily      . simvastatin (ZOCOR) 20 MG tablet Take 1  tablet (20 mg total) by mouth at bedtime.  90 tablet  1  . Teriparatide, Recombinant, (FORTEO [place]) Inject into the skin at bedtime.       . traMADol (ULTRAM) 50 MG tablet Take 50 mg by mouth every 6 (six) hours as needed. For pain      . vitamin C (ASCORBIC ACID) 500 MG tablet Take 500 mg by mouth daily.      . vitamin E 400 UNIT capsule Take 400 Units by mouth daily.       No current facility-administered medications for this visit.    Allergies  Allergen Reactions  . Zyrtec [Cetirizine Hcl] Hives    Past Medical History  Diagnosis Date  . Hypertension   . High cholesterol   . Mitral valve disorder   . Diverticulosis   . Blood dyscrasia     IGg  kappa  . GERD (gastroesophageal reflux disease)   . Arthritis     "bad; probably all over; lower back; hands, not RA" (06/23/2012)  . Osteoarthritis   . Fall at home     "fell in the bathtub yesterday; bruised my left  arm" (06/23/2012)  . Aortic insufficiency     Past Surgical History  Procedure Laterality Date  . Laparoscopy  08/25/2011    Procedure: LAPAROSCOPY DIAGNOSTIC;  Surgeon: Judieth Keens, DO;  Location: [facility];  Service: General;  Laterality: N/A;  reduction obturator hernia obturator hernia repair  . Patella fracture surgery  2011    right  . Total shoulder arthroplasty  06/23/2012    left  . Vaginal hysterectomy  ~ 1996  . Hernia repair    . Cataract extraction w/ intraocular lens  implant, bilateral  ~ 2003  . Total shoulder arthroplasty  06/23/2012    Procedure: TOTAL SHOULDER ARTHROPLASTY;  Surgeon: Marin Shutter, MD;  Location: [facility];  Service: Orthopedics;  Laterality: Left;  left total shoulder arthroplasty    History   Social History  . Marital Status: Single    Spouse Name: N/A    Number of Children: 3  . Years of Education: N/A   Occupational History  .     Social History Main Topics  . Smoking status: Never Smoker   . Smokeless tobacco: Never Used  . Alcohol Use: Yes     Comment: Rare  .  Drug Use: No  . Sexual Activity: Not Currently   Other Topics Concern  . Not on file   Social History Narrative  . No narrative on file    Family History  Problem Relation Age of Onset  . Heart disease Father   . Cancer Mother     Breast cancer    ROS: no fevers or chills, productive cough, hemoptysis, dysphasia, odynophagia, melena, hematochezia, dysuria, hematuria, rash, seizure activity, orthopnea, PND, claudication. Remaining systems are negative.  Physical Exam:   Blood pressure 140/82, pulse 67, height 5\' 1"  (1.549 m), weight 130 lb 1.3 oz (59.004 kg).  General:  Well developed/well nourished in NAD Skin warm/dry Patient not depressed No peripheral clubbing Back-normal HEENT-normal/normal eyelids Neck supple/normal carotid upstroke bilaterally; no bruits; no JVD; no thyromegaly chest - CTA/ normal expansion CV - RRR/normal S1 and S2; no murmurs, rubs or gallops;  PMI nondisplaced Abdomen -NT/ND, no HSM, no mass, + bowel sounds, no bruit 2+ femoral pulses, no bruits Ext-no edema, chords, diminished distal pulses Neuro-grossly nonfocal  ECG Sinus rhythm, PACs, CRO prior septal MI

## 2013-11-01 NOTE — Assessment & Plan Note (Signed)
Blood pressure controlled.continue present medications. 

## 2013-11-06 ENCOUNTER — Other Ambulatory Visit: Payer: Self-pay | Admitting: *Deleted

## 2013-11-06 DIAGNOSIS — I10 Essential (primary) hypertension: Secondary | ICD-10-CM

## 2013-11-06 DIAGNOSIS — D472 Monoclonal gammopathy: Secondary | ICD-10-CM

## 2013-11-06 DIAGNOSIS — M1990 Unspecified osteoarthritis, unspecified site: Secondary | ICD-10-CM

## 2013-11-06 MED ORDER — POTASSIUM CHLORIDE ER 10 MEQ PO CPCR: 10.0000 meq | ORAL_CAPSULE | Freq: Every day | ORAL | Status: DC

## 2013-11-06 NOTE — Telephone Encounter (Signed)
Refill per Dr DDS VO

## 2013-11-09 ENCOUNTER — Encounter: Payer: Self-pay | Admitting: Internal Medicine

## 2013-11-09 ENCOUNTER — Ambulatory Visit (INDEPENDENT_AMBULATORY_CARE_PROVIDER_SITE_OTHER): Payer: Medicare Other | Admitting: Internal Medicine

## 2013-11-09 VITALS — BP 132/70 | HR 86 | Temp 98.2°F | Resp 18 | Wt 128.0 lb

## 2013-11-09 DIAGNOSIS — K5730 Diverticulosis of large intestine without perforation or abscess without bleeding: Secondary | ICD-10-CM

## 2013-11-09 DIAGNOSIS — K8689 Other specified diseases of pancreas: Secondary | ICD-10-CM

## 2013-11-09 DIAGNOSIS — K5790 Diverticulosis of intestine, part unspecified, without perforation or abscess without bleeding: Secondary | ICD-10-CM | POA: Insufficient documentation

## 2013-11-09 DIAGNOSIS — E785 Hyperlipidemia, unspecified: Secondary | ICD-10-CM

## 2013-11-09 DIAGNOSIS — K869 Disease of pancreas, unspecified: Secondary | ICD-10-CM

## 2013-11-09 DIAGNOSIS — K8020 Calculus of gallbladder without cholecystitis without obstruction: Secondary | ICD-10-CM

## 2013-11-09 DIAGNOSIS — I10 Essential (primary) hypertension: Secondary | ICD-10-CM

## 2013-11-09 NOTE — Progress Notes (Signed)
Subjective:    Patient ID: [name], female    DOB: 04-Oct-1928, 80 y.o.   MRN: [id number]  HPI [name] is here for follow up of several issues.   Question pancreatic mass:  See CT 2/19  No solid pancreatic mass , she does have cystic foci unchanged from 2013.  She does have a gallstone   HTN:  She has stopped her HCTZ and is taking her lasix with K.    Hyperlipidemia LDL in January 158.  Pt. Wanted to stop her statin altogether but I advised to reduce dose to 20 mg daily.  Will recheck fasting labs in April    See cardiology note   Allergies  Allergen Reactions  . Zyrtec [Cetirizine Hcl] Hives   Past Medical History  Diagnosis Date  . Hypertension   . High cholesterol   . Mitral valve disorder   . Diverticulosis   . Blood dyscrasia     IGg  kappa  . GERD (gastroesophageal reflux disease)   . Arthritis     "bad; probably all over; lower back; hands, not RA" (06/23/2012)  . Osteoarthritis   . Fall at home     "fell in the bathtub yesterday; bruised my left arm" (06/23/2012)  . Aortic insufficiency    Past Surgical History  Procedure Laterality Date  . Laparoscopy  08/25/2011    Procedure: LAPAROSCOPY DIAGNOSTIC;  Surgeon: Judieth Keens, DO;  Location: [facility];  Service: General;  Laterality: N/A;  reduction obturator hernia obturator hernia repair  . Patella fracture surgery  2011    right  . Total shoulder arthroplasty  06/23/2012    left  . Vaginal hysterectomy  ~ 1996  . Hernia repair    . Cataract extraction w/ intraocular lens  implant, bilateral  ~ 2003  . Total shoulder arthroplasty  06/23/2012    Procedure: TOTAL SHOULDER ARTHROPLASTY;  Surgeon: Marin Shutter, MD;  Location: [facility];  Service: Orthopedics;  Laterality: Left;  left total shoulder arthroplasty   History   Social History  . Marital Status: Single    Spouse Name: N/A    Number of Children: 3  . Years of Education: N/A   Occupational History  .     Social History Main Topics  .  Smoking status: Never Smoker   . Smokeless tobacco: Never Used  . Alcohol Use: Yes     Comment: Rare  . Drug Use: No  . Sexual Activity: Not Currently   Other Topics Concern  . Not on file   Social History Narrative  . No narrative on file   Family History  Problem Relation Age of Onset  . Heart disease Father   . Cancer Mother     Breast cancer   Patient Active Problem List   Diagnosis Date Noted  . Diverticulosis 11/09/2013  . Cholelithiasis 10/17/2013  . Pancreatic mass 10/12/2013  . Hyperlipidemia 09/19/2013  . GERD (gastroesophageal reflux disease) 09/19/2013  . S/P hysterectomy 09/19/2013  . Osteoporosis 09/19/2013  . Arthritis of shoulder region, left 06/27/2012  . AI (aortic insufficiency) 08/27/2011  . Arthritis 08/27/2011  . Hx-TIA (transient ischemic attack) 08/27/2011  . IgM monoclonal gammopathy of uncertain significance 08/26/2011  . Hypertension 08/26/2011  . Incarcerated femoral hernia 08/26/2011  . Elevated cholesterol 08/26/2011   Current Outpatient Prescriptions on File Prior to Visit  Medication Sig Dispense Refill  . aspirin 500 MG EC tablet Take 1,000 mg by mouth at bedtime as needed. For pain      .  beta carotene w/minerals (OCUVITE) tablet Take 1 tablet by mouth daily.      . calcium citrate-vitamin D (CITRACAL+D) 315-200 MG-UNIT per tablet Take 1 tablet by mouth daily.       . Cholecalciferol (VITAMIN D) 2000 UNITS CAPS Take 5,000 Units by mouth daily.       Marland Kitchen FLECTOR 1.3 % PTCH Take 1 patch by mouth daily as needed. For pain      . furosemide (LASIX) 20 MG tablet Take 20 mg by mouth daily.        Marland Kitchen gabapentin (NEURONTIN) 300 MG capsule Take 300-600 mg by mouth 3 (three) times daily. Take one tablet in the morning and at noon. Take two tablet at night      . lisinopril (PRINIVIL,ZESTRIL) 20 MG tablet Take 20 mg by mouth daily.        Marland Kitchen omeprazole (PRILOSEC) 20 MG capsule Take 20 mg by mouth daily.       . pantoprazole (PROTONIX) 40 MG tablet  Take 40 mg by mouth daily.       Vladimir Faster Glycol-Propyl Glycol (SYSTANE ULTRA PF) 0.4-0.3 % SOLN Place 1 drop into both eyes 2 (two) times daily.       . polyethylene glycol (MIRALAX / GLYCOLAX) packet Take 17 g by mouth daily as needed.       . potassium chloride (MICRO-K) 10 MEQ CR capsule Take 1 capsule (10 mEq total) by mouth daily.  90 capsule  1  . Probiotic Product (ALIGN) 4 MG CAPS Take by mouth. Once daily      . simvastatin (ZOCOR) 20 MG tablet Take 1 tablet (20 mg total) by mouth at bedtime.  90 tablet  1  . Teriparatide, Recombinant, (FORTEO [place]) Inject into the skin at bedtime.       . traMADol (ULTRAM) 50 MG tablet Take 50 mg by mouth every 6 (six) hours as needed. For pain      . vitamin C (ASCORBIC ACID) 500 MG tablet Take 500 mg by mouth daily.      . vitamin E 400 UNIT capsule Take 400 Units by mouth daily.      . COD LIVER OIL PO Take 1 capsule by mouth daily as needed.        No current facility-administered medications on file prior to visit.       Review of Systems See HPI    Objective:   Physical Exam Physical Exam  Nursing note and vitals reviewed.  Constitutional: She is oriented to person, place, and time. She appears well-developed and well-nourished.  HENT:  Head: Normocephalic and atraumatic.  Cardiovascular: Normal rate and regular rhythm. Exam reveals no gallop and no friction rub.  No murmur heard.  Pulmonary/Chest: Breath sounds normal. She has no wheezes. She has no rales.  Neurological: She is alert and oriented to person, place, and time.  Skin: Skin is warm and dry.  Psychiatric: She has a normal mood and affect. Her behavior is normal.             Assessment & Plan:  HTN  contineu meds off HCTZ  Hyperlipidemia  Continue statin   AI  Evaluated by cardiology with ECHO  Pending.     Incidental pancreatic mass.   Ct appears consistant with cyst.  No change from 2013  Schedule CPE with me  See prn

## 2013-11-09 NOTE — Patient Instructions (Addendum)
Schedule CPE

## 2013-11-21 ENCOUNTER — Ambulatory Visit (HOSPITAL_COMMUNITY): Payer: Medicare Other | Attending: Cardiology | Admitting: Radiology

## 2013-11-21 DIAGNOSIS — I359 Nonrheumatic aortic valve disorder, unspecified: Secondary | ICD-10-CM

## 2013-11-21 DIAGNOSIS — I351 Nonrheumatic aortic (valve) insufficiency: Secondary | ICD-10-CM

## 2013-11-21 NOTE — Progress Notes (Signed)
Echocardiogram Performed. 

## 2013-12-20 ENCOUNTER — Emergency Department (HOSPITAL_BASED_OUTPATIENT_CLINIC_OR_DEPARTMENT_OTHER)
Admission: EM | Admit: 2013-12-20 | Discharge: 2013-12-20 | Disposition: A | Discharge status: Home / Self Care | Payer: Medicare Other | Attending: Emergency Medicine | Admitting: Emergency Medicine

## 2013-12-20 ENCOUNTER — Encounter (HOSPITAL_BASED_OUTPATIENT_CLINIC_OR_DEPARTMENT_OTHER): Payer: Self-pay | Admitting: Emergency Medicine

## 2013-12-20 DIAGNOSIS — S61209A Unspecified open wound of unspecified finger without damage to nail, initial encounter: Secondary | ICD-10-CM | POA: Insufficient documentation

## 2013-12-20 DIAGNOSIS — Z862 Personal history of diseases of the blood and blood-forming organs and certain disorders involving the immune mechanism: Secondary | ICD-10-CM | POA: Insufficient documentation

## 2013-12-20 DIAGNOSIS — I10 Essential (primary) hypertension: Secondary | ICD-10-CM | POA: Insufficient documentation

## 2013-12-20 DIAGNOSIS — M19049 Primary osteoarthritis, unspecified hand: Secondary | ICD-10-CM | POA: Insufficient documentation

## 2013-12-20 DIAGNOSIS — K219 Gastro-esophageal reflux disease without esophagitis: Secondary | ICD-10-CM | POA: Insufficient documentation

## 2013-12-20 DIAGNOSIS — S0181XA Laceration without foreign body of other part of head, initial encounter: Secondary | ICD-10-CM

## 2013-12-20 DIAGNOSIS — W540XXA Bitten by dog, initial encounter: Secondary | ICD-10-CM | POA: Insufficient documentation

## 2013-12-20 DIAGNOSIS — Y9389 Activity, other specified: Secondary | ICD-10-CM | POA: Insufficient documentation

## 2013-12-20 DIAGNOSIS — S0180XA Unspecified open wound of other part of head, initial encounter: Secondary | ICD-10-CM | POA: Insufficient documentation

## 2013-12-20 DIAGNOSIS — Y9289 Other specified places as the place of occurrence of the external cause: Secondary | ICD-10-CM | POA: Insufficient documentation

## 2013-12-20 DIAGNOSIS — S61411A Laceration without foreign body of right hand, initial encounter: Secondary | ICD-10-CM

## 2013-12-20 DIAGNOSIS — M1990 Unspecified osteoarthritis, unspecified site: Secondary | ICD-10-CM | POA: Insufficient documentation

## 2013-12-20 DIAGNOSIS — Z7982 Long term (current) use of aspirin: Secondary | ICD-10-CM | POA: Insufficient documentation

## 2013-12-20 DIAGNOSIS — Z23 Encounter for immunization: Secondary | ICD-10-CM | POA: Insufficient documentation

## 2013-12-20 DIAGNOSIS — Z79899 Other long term (current) drug therapy: Secondary | ICD-10-CM | POA: Insufficient documentation

## 2013-12-20 DIAGNOSIS — S01409A Unspecified open wound of unspecified cheek and temporomandibular area, initial encounter: Secondary | ICD-10-CM | POA: Insufficient documentation

## 2013-12-20 DIAGNOSIS — E7800 Pure hypercholesterolemia, unspecified: Secondary | ICD-10-CM | POA: Insufficient documentation

## 2013-12-20 DIAGNOSIS — M129 Arthropathy, unspecified: Secondary | ICD-10-CM | POA: Insufficient documentation

## 2013-12-20 DIAGNOSIS — Z87828 Personal history of other (healed) physical injury and trauma: Secondary | ICD-10-CM | POA: Insufficient documentation

## 2013-12-20 MED ORDER — AMOXICILLIN-POT CLAVULANATE 500-125 MG PO TABS: 1.0000 | ORAL_TABLET | Freq: Three times a day (TID) | ORAL | Status: DC

## 2013-12-20 MED ORDER — TETANUS-DIPHTH-ACELL PERTUSSIS 5-2.5-18.5 LF-MCG/0.5 IM SUSP
0.5000 mL | Freq: Once | INTRAMUSCULAR | Status: AC
  Administered 2013-12-20: 0.5 mL via INTRAMUSCULAR
  Filled 2013-12-20: qty 0.5

## 2013-12-20 NOTE — ED Notes (Signed)
Pt reports animal bite to left side of face and right ring finger x 20 mins ago

## 2013-12-20 NOTE — Discharge Instructions (Signed)
Augmentin as prescribed.  Keep the wound clean and dry. Apply bacitracin and dressing twice daily. Return to the ER if you develop redness around the wound, pus coming from the wound, or red streaks.  Sutures are to be removed in 5 days.  The neighbor's dog should be observed for the next 10 days. If the dog becomes sick and dies, you should return to the ER to begin a rabies series.   Laceration Care, Adult A laceration is a cut or lesion that goes through all layers of the skin and into the tissue just beneath the skin. TREATMENT  Some lacerations may not require closure. Some lacerations may not be able to be closed due to an increased risk of infection. It is important to see your caregiver as soon as possible after an injury to minimize the risk of infection and maximize the opportunity for successful closure. If closure is appropriate, pain medicines may be given, if needed. The wound will be cleaned to help prevent infection. Your caregiver will use stitches (sutures), staples, wound glue (adhesive), or skin adhesive strips to repair the laceration. These tools bring the skin edges together to allow for faster healing and a better cosmetic outcome. However, all wounds will heal with a scar. Once the wound has healed, scarring can be minimized by covering the wound with sunscreen during the day for 1 full year. HOME CARE INSTRUCTIONS  For sutures or staples:  Keep the wound clean and dry.  If you were given a bandage (dressing), you should change it at least once a day. Also, change the dressing if it becomes wet or dirty, or as directed by your caregiver.  Wash the wound with soap and water 2 times a day. Rinse the wound off with water to remove all soap. Pat the wound dry with a clean towel.  After cleaning, apply a thin layer of the antibiotic ointment as recommended by your caregiver. This will help prevent infection and keep the dressing from sticking.  You may shower as usual  after the first 24 hours. Do not soak the wound in water until the sutures are removed.  Only take over-the-counter or prescription medicines for pain, discomfort, or fever as directed by your caregiver.  Get your sutures or staples removed as directed by your caregiver. For skin adhesive strips:  Keep the wound clean and dry.  Do not get the skin adhesive strips wet. You may bathe carefully, using caution to keep the wound dry.  If the wound gets wet, pat it dry with a clean towel.  Skin adhesive strips will fall off on their own. You may trim the strips as the wound heals. Do not remove skin adhesive strips that are still stuck to the wound. They will fall off in time. For wound adhesive:  You may briefly wet your wound in the shower or bath. Do not soak or scrub the wound. Do not swim. Avoid periods of heavy perspiration until the skin adhesive has fallen off on its own. After showering or bathing, gently pat the wound dry with a clean towel.  Do not apply liquid medicine, cream medicine, or ointment medicine to your wound while the skin adhesive is in place. This may loosen the film before your wound is healed.  If a dressing is placed over the wound, be careful not to apply tape directly over the skin adhesive. This may cause the adhesive to be pulled off before the wound is healed.  Avoid prolonged exposure  to sunlight or tanning lamps while the skin adhesive is in place. Exposure to ultraviolet light in the first year will darken the scar.  The skin adhesive will usually remain in place for 5 to 10 days, then naturally fall off the skin. Do not pick at the adhesive film. You may need a tetanus shot if:  You cannot remember when you had your last tetanus shot.  You have never had a tetanus shot. If you get a tetanus shot, your arm may swell, get red, and feel warm to the touch. This is common and not a problem. If you need a tetanus shot and you choose not to have one, there is a  rare chance of getting tetanus. Sickness from tetanus can be serious. SEEK MEDICAL CARE IF:   You have redness, swelling, or increasing pain in the wound.  You see a red line that goes away from the wound.  You have yellowish-white fluid (pus) coming from the wound.  You have a fever.  You notice a bad smell coming from the wound or dressing.  Your wound breaks open before or after sutures have been removed.  You notice something coming out of the wound such as wood or glass.  Your wound is on your hand or foot and you cannot move a finger or toe. SEEK IMMEDIATE MEDICAL CARE IF:   Your pain is not controlled with prescribed medicine.  You have severe swelling around the wound causing pain and numbness or a change in color in your arm, hand, leg, or foot.  Your wound splits open and starts bleeding.  You have worsening numbness, weakness, or loss of function of any joint around or beyond the wound.  You develop painful lumps near the wound or on the skin anywhere on your body. MAKE SURE YOU:   Understand these instructions.  Will watch your condition.  Will get help right away if you are not doing well or get worse. Document Released: 08/10/2005 Document Revised: 11/02/2011 Document Reviewed: 02/03/2011 [facility] Patient Information 2014 [place], [place].

## 2013-12-20 NOTE — ED Provider Notes (Signed)
CSN: [id number]     Arrival date & time 12/20/13  1619 History   First MD Initiated Contact with Patient 12/20/13 1626     Chief Complaint  Patient presents with  . Animal Bite     (Consider location/radiation/quality/duration/timing/severity/associated sxs/prior Treatment) HPI Comments: Patient is an 80 year old female who presents after a dog bite. She walked up to the neighbors vehicle and his dog was in the front seat. When she approached the window a dog lunged forward and did her right ring finger and left cheek. Last tetanus shot unknown.  Patient is a 80 y.o. female presenting with animal bite. The history is provided by the patient.  Animal Bite Contact animal:  Dog Location:  Face and hand Time since incident:  1 hour Pain details:    Quality:  Aching   Severity:  Moderate   Timing:  Constant   Progression:  Unchanged Provoked: unprovoked   Notifications:  None Animal's rabies vaccination status:  Out of date   Past Medical History  Diagnosis Date  . Hypertension   . High cholesterol   . Mitral valve disorder   . Diverticulosis   . Blood dyscrasia     IGg  kappa  . GERD (gastroesophageal reflux disease)   . Arthritis     "bad; probably all over; lower back; hands, not RA" (06/23/2012)  . Osteoarthritis   . Fall at home     "fell in the bathtub yesterday; bruised my left arm" (06/23/2012)  . Aortic insufficiency    Past Surgical History  Procedure Laterality Date  . Laparoscopy  08/25/2011    Procedure: LAPAROSCOPY DIAGNOSTIC;  Surgeon: Judieth Keens, DO;  Location: [facility];  Service: General;  Laterality: N/A;  reduction obturator hernia obturator hernia repair  . Patella fracture surgery  2011    right  . Total shoulder arthroplasty  06/23/2012    left  . Vaginal hysterectomy  ~ 1996  . Hernia repair    . Cataract extraction w/ intraocular lens  implant, bilateral  ~ 2003  . Total shoulder arthroplasty  06/23/2012    Procedure: TOTAL SHOULDER  ARTHROPLASTY;  Surgeon: Marin Shutter, MD;  Location: [place];  Service: Orthopedics;  Laterality: Left;  left total shoulder arthroplasty   Family History  Problem Relation Age of Onset  . Heart disease Father   . Cancer Mother     Breast cancer   History  Substance Use Topics  . Smoking status: Never Smoker   . Smokeless tobacco: Never Used  . Alcohol Use: Yes     Comment: Rare   OB History   Grav Para Term Preterm Abortions TAB SAB Ect Mult Living                 Review of Systems  All other systems reviewed and are negative.     Allergies  Zyrtec  Home Medications   Prior to Admission medications   Medication Sig Start Date End Date Taking? Authorizing Provider  aspirin 500 MG EC tablet Take 1,000 mg by mouth at bedtime as needed. For pain    Historical Provider, MD  beta carotene w/minerals (OCUVITE) tablet Take 1 tablet by mouth daily.    Historical Provider, MD  calcium citrate-vitamin D (CITRACAL+D) 315-200 MG-UNIT per tablet Take 1 tablet by mouth daily.     Historical Provider, MD  Cholecalciferol (VITAMIN D) 2000 UNITS CAPS Take 5,000 Units by mouth daily.     Historical Provider, MD  COD LIVER  OIL PO Take 1 capsule by mouth daily as needed.     Historical Provider, MD  FLECTOR 1.3 % PTCH Take 1 patch by mouth daily as needed. For pain 04/06/12   Historical Provider, MD  furosemide (LASIX) 20 MG tablet Take 20 mg by mouth daily.      Historical Provider, MD  gabapentin (NEURONTIN) 300 MG capsule Take 300-600 mg by mouth 3 (three) times daily. Take one tablet in the morning and at noon. Take two tablet at night    Historical Provider, MD  lisinopril (PRINIVIL,ZESTRIL) 20 MG tablet Take 20 mg by mouth daily.      Historical Provider, MD  omeprazole (PRILOSEC) 20 MG capsule Take 20 mg by mouth daily.  06/06/13   Historical Provider, MD  pantoprazole (PROTONIX) 40 MG tablet Take 40 mg by mouth daily.  04/20/12   Historical Provider, MD  Polyethyl Glycol-Propyl Glycol  (SYSTANE ULTRA PF) 0.4-0.3 % SOLN Place 1 drop into both eyes 2 (two) times daily.     Historical Provider, MD  polyethylene glycol (MIRALAX / GLYCOLAX) packet Take 17 g by mouth daily as needed.     Historical Provider, MD  potassium chloride (MICRO-K) 10 MEQ CR capsule Take 1 capsule (10 mEq total) by mouth daily. 11/06/13   Lanice Shirts, MD  Probiotic Product (ALIGN) 4 MG CAPS Take by mouth. Once daily    Historical Provider, MD  simvastatin (ZOCOR) 20 MG tablet Take 1 tablet (20 mg total) by mouth at bedtime. 10/12/13   Lanice Shirts, MD  Teriparatide, Recombinant, (FORTEO [place]) Inject into the skin at bedtime.     Historical Provider, MD  traMADol (ULTRAM) 50 MG tablet Take 50 mg by mouth every 6 (six) hours as needed. For pain 04/26/12   Historical Provider, MD  vitamin C (ASCORBIC ACID) 500 MG tablet Take 500 mg by mouth daily.    Historical Provider, MD  vitamin E 400 UNIT capsule Take 400 Units by mouth daily.    Historical Provider, MD   BP 174/87  Pulse 84  Temp(Src) 98.1 F (36.7 C) (Oral)  Resp 16  Ht 5\' 2"  (1.575 m)  Wt 125 lb (56.7 kg)  BMI 22.86 kg/m2 Physical Exam  Nursing note and vitals reviewed. Constitutional: She is oriented to person, place, and time. She appears well-developed and well-nourished. No distress.  HENT:  Head: Normocephalic.  Mouth/Throat: Oropharynx is clear and moist.  There is a 2.5 cm laceration to the left cheek.  Neck: Normal range of motion. Neck supple.  Musculoskeletal: Normal range of motion.  The right ring finger is noted to have several superficial lacerations to the dorsal and ventral aspect. There is no tendon involvement.  Lymphadenopathy:    She has no cervical adenopathy.  Neurological: She is alert and oriented to person, place, and time.  Skin: Skin is warm and dry. She is not diaphoretic.    ED Course  Procedures (including critical care time) Labs Review Labs Reviewed - No data to display  Imaging Review No  results found.  LACERATION REPAIR Performed by: Veryl Speak Authorized by: Veryl Speak Consent: Verbal consent obtained. Risks and benefits: risks, benefits and alternatives were discussed Consent given by: patient Patient identity confirmed: provided demographic data Prepped and Draped in normal sterile fashion Wound explored  Laceration Location: left face  Laceration Length: 2.5cm  No Foreign Bodies seen or palpated  Anesthesia: local infiltration  Local anesthetic: lidocaine 2% with epinephrine  Anesthetic total: 2 ml  Irrigation method: syringe Amount of cleaning: standard  Skin closure: 6-0 prolene  Number of sutures: 5  Technique: simple interrupted  Patient tolerance: Patient tolerated the procedure well with no immediate complications.   MDM   Final diagnoses:  None    The facial laceration was repaired the patient tolerated this well. The wounds to the index finger are not in need of repair and will be treated with local wound care. She will be treated with Augmentin and sutures will be removed in 5 days. Her last tetanus shot is unknown and this will be updated.    Veryl Speak, MD 12/20/13 1705

## 2013-12-26 ENCOUNTER — Emergency Department (HOSPITAL_BASED_OUTPATIENT_CLINIC_OR_DEPARTMENT_OTHER)
Admission: EM | Admit: 2013-12-26 | Discharge: 2013-12-26 | Disposition: A | Discharge status: Home / Self Care | Payer: Medicare Other | Attending: Emergency Medicine | Admitting: Emergency Medicine

## 2013-12-26 ENCOUNTER — Encounter (HOSPITAL_BASED_OUTPATIENT_CLINIC_OR_DEPARTMENT_OTHER): Payer: Self-pay | Admitting: Emergency Medicine

## 2013-12-26 DIAGNOSIS — M129 Arthropathy, unspecified: Secondary | ICD-10-CM | POA: Insufficient documentation

## 2013-12-26 DIAGNOSIS — I10 Essential (primary) hypertension: Secondary | ICD-10-CM | POA: Insufficient documentation

## 2013-12-26 DIAGNOSIS — Z7982 Long term (current) use of aspirin: Secondary | ICD-10-CM | POA: Insufficient documentation

## 2013-12-26 DIAGNOSIS — Z862 Personal history of diseases of the blood and blood-forming organs and certain disorders involving the immune mechanism: Secondary | ICD-10-CM | POA: Insufficient documentation

## 2013-12-26 DIAGNOSIS — E7800 Pure hypercholesterolemia, unspecified: Secondary | ICD-10-CM | POA: Insufficient documentation

## 2013-12-26 DIAGNOSIS — M1990 Unspecified osteoarthritis, unspecified site: Secondary | ICD-10-CM | POA: Insufficient documentation

## 2013-12-26 DIAGNOSIS — M19049 Primary osteoarthritis, unspecified hand: Secondary | ICD-10-CM | POA: Insufficient documentation

## 2013-12-26 DIAGNOSIS — Z4802 Encounter for removal of sutures: Secondary | ICD-10-CM

## 2013-12-26 DIAGNOSIS — K219 Gastro-esophageal reflux disease without esophagitis: Secondary | ICD-10-CM | POA: Insufficient documentation

## 2013-12-26 DIAGNOSIS — Z79899 Other long term (current) drug therapy: Secondary | ICD-10-CM | POA: Insufficient documentation

## 2013-12-26 NOTE — ED Provider Notes (Signed)
CSN: [id number]     Arrival date & time 12/26/13  1559 History   First MD Initiated Contact with Patient 12/26/13 1605     Chief Complaint  Patient presents with  . Suture / Staple Removal     (Consider location/radiation/quality/duration/timing/severity/associated sxs/prior Treatment) HPI [name] is a 80 y.o. female who presents to the ED for suture removal. The sutures were placed 5/29 to the left side of her face. She denies any problems.   Past Medical History  Diagnosis Date  . Hypertension   . High cholesterol   . Mitral valve disorder   . Diverticulosis   . Blood dyscrasia     IGg  kappa  . GERD (gastroesophageal reflux disease)   . Arthritis     "bad; probably all over; lower back; hands, not RA" (06/23/2012)  . Osteoarthritis   . Fall at home     "fell in the bathtub yesterday; bruised my left arm" (06/23/2012)  . Aortic insufficiency    Past Surgical History  Procedure Laterality Date  . Laparoscopy  08/25/2011    Procedure: LAPAROSCOPY DIAGNOSTIC;  Surgeon: Judieth Keens, DO;  Location: [facility];  Service: General;  Laterality: N/A;  reduction obturator hernia obturator hernia repair  . Patella fracture surgery  2011    right  . Total shoulder arthroplasty  06/23/2012    left  . Vaginal hysterectomy  ~ 1996  . Hernia repair    . Cataract extraction w/ intraocular lens  implant, bilateral  ~ 2003  . Total shoulder arthroplasty  06/23/2012    Procedure: TOTAL SHOULDER ARTHROPLASTY;  Surgeon: Marin Shutter, MD;  Location: [place];  Service: Orthopedics;  Laterality: Left;  left total shoulder arthroplasty   Family History  Problem Relation Age of Onset  . Heart disease Father   . Cancer Mother     Breast cancer   History  Substance Use Topics  . Smoking status: Never Smoker   . Smokeless tobacco: Never Used  . Alcohol Use: Yes     Comment: Rare   OB History   Grav Para Term Preterm Abortions TAB SAB Ect Mult Living                 Review of  Systems Negative except as stated in HPI   Allergies  Zyrtec  Home Medications   Prior to Admission medications   Medication Sig Start Date End Date Taking? Authorizing Provider  amoxicillin-clavulanate (AUGMENTIN) 500-125 MG per tablet Take 1 tablet (500 mg total) by mouth every 8 (eight) hours. 12/20/13   Veryl Speak, MD  aspirin 500 MG EC tablet Take 1,000 mg by mouth at bedtime as needed. For pain    Historical Provider, MD  beta carotene w/minerals (OCUVITE) tablet Take 1 tablet by mouth daily.    Historical Provider, MD  calcium citrate-vitamin D (CITRACAL+D) 315-200 MG-UNIT per tablet Take 1 tablet by mouth daily.     Historical Provider, MD  Cholecalciferol (VITAMIN D) 2000 UNITS CAPS Take 5,000 Units by mouth daily.     Historical Provider, MD  COD LIVER OIL PO Take 1 capsule by mouth daily as needed.     Historical Provider, MD  FLECTOR 1.3 % PTCH Take 1 patch by mouth daily as needed. For pain 04/06/12   Historical Provider, MD  furosemide (LASIX) 20 MG tablet Take 20 mg by mouth daily.      Historical Provider, MD  gabapentin (NEURONTIN) 300 MG capsule Take 300-600 mg by  mouth 3 (three) times daily. Take one tablet in the morning and at noon. Take two tablet at night    Historical Provider, MD  lisinopril (PRINIVIL,ZESTRIL) 20 MG tablet Take 20 mg by mouth daily.      Historical Provider, MD  omeprazole (PRILOSEC) 20 MG capsule Take 20 mg by mouth daily.  06/06/13   Historical Provider, MD  pantoprazole (PROTONIX) 40 MG tablet Take 40 mg by mouth daily.  04/20/12   Historical Provider, MD  Polyethyl Glycol-Propyl Glycol (SYSTANE ULTRA PF) 0.4-0.3 % SOLN Place 1 drop into both eyes 2 (two) times daily.     Historical Provider, MD  polyethylene glycol (MIRALAX / GLYCOLAX) packet Take 17 g by mouth daily as needed.     Historical Provider, MD  potassium chloride (MICRO-K) 10 MEQ CR capsule Take 1 capsule (10 mEq total) by mouth daily. 11/06/13   Lanice Shirts, MD  Probiotic  Product (ALIGN) 4 MG CAPS Take by mouth. Once daily    Historical Provider, MD  simvastatin (ZOCOR) 20 MG tablet Take 1 tablet (20 mg total) by mouth at bedtime. 10/12/13   Lanice Shirts, MD  Teriparatide, Recombinant, (FORTEO [place]) Inject into the skin at bedtime.     Historical Provider, MD  traMADol (ULTRAM) 50 MG tablet Take 50 mg by mouth every 6 (six) hours as needed. For pain 04/26/12   Historical Provider, MD  vitamin C (ASCORBIC ACID) 500 MG tablet Take 500 mg by mouth daily.    Historical Provider, MD  vitamin E 400 UNIT capsule Take 400 Units by mouth daily.    Historical Provider, MD   BP 113/63  Pulse 88  Temp(Src) 98.2 F (36.8 C) (Oral)  Resp 16  Wt 125 lb (56.7 kg)  SpO2 95% Physical Exam  Nursing note and vitals reviewed. Constitutional: She is oriented to person, place, and time. She appears well-developed and well-nourished. No distress.  HENT:  Head:    Sutures in place left side of face without redness, drainage or other signs of infection.   Eyes: EOM are normal.  Neck: Neck supple.  Pulmonary/Chest: Effort normal.  Abdominal: Soft. There is no tenderness.  Musculoskeletal: Normal range of motion.  Neurological: She is alert and oriented to person, place, and time. No cranial nerve deficit.  Skin: Skin is warm and dry.    ED Course  Procedures  Suture removal MDM  80 y.o. female here for suture removal. Sutures removed by RN without difficulty. Discussed with the patient wound care. She voices understanding. She will return as needed for any problems.    [place], [place] 12/26/13 [phone]

## 2013-12-26 NOTE — Discharge Instructions (Signed)
Suture Removal, Care After Refer to this sheet in the next few weeks. These instructions provide you with information on caring for yourself after your procedure. Your health care provider may also give you more specific instructions. Your treatment has been planned according to current medical practices, but problems sometimes occur. Call your health care provider if you have any problems or questions after your procedure. WHAT TO EXPECT AFTER THE PROCEDURE After your stitches (sutures) are removed, it is typical to have the following:  Some discomfort and swelling in the wound area.  Slight redness in the area. HOME CARE INSTRUCTIONS   If you have skin adhesive strips over the wound area, do not take the strips off. They will fall off on their own in a few days. If the strips remain in place after 14 days, you may remove them.  Change any bandages (dressings) at least once a day or as directed by your health care provider. If the bandage sticks, soak it off with warm, soapy water.  Apply cream or ointment only as directed by your health care provider. If using cream or ointment, wash the area with soap and water 2 times a day to remove all the cream or ointment. Rinse off the soap and pat the area dry with a clean towel.  Keep the wound area dry and clean. If the bandage becomes wet or dirty, or if it develops a bad smell, change it as soon as possible.  Continue to protect the wound from injury.  Use sunscreen when out in the sun. New scars become sunburned easily. SEEK MEDICAL CARE IF:  You have increasing redness, swelling, or pain in the wound.  You see pus coming from the wound.  You have a fever.  You notice a bad smell coming from the wound or dressing.  Your wound breaks open (edges not staying together). Document Released: 05/05/2001 Document Revised: 05/31/2013 Document Reviewed: 03/22/2013 [employer] Patient Information 2014 [employer].  

## 2013-12-26 NOTE — ED Provider Notes (Signed)
  Medical screening examination/treatment/procedure(s) were performed by non-physician practitioner and as supervising physician I was immediately available for consultation/collaboration.   Carmin Muskrat, MD 12/26/13 1640

## 2013-12-26 NOTE — ED Notes (Signed)
Here for suture removal  Last seen 5/29

## 2014-01-17 ENCOUNTER — Encounter: Payer: Self-pay | Admitting: Hematology & Oncology

## 2014-01-17 ENCOUNTER — Ambulatory Visit (HOSPITAL_BASED_OUTPATIENT_CLINIC_OR_DEPARTMENT_OTHER): Payer: Medicare Other | Admitting: Lab

## 2014-01-17 ENCOUNTER — Ambulatory Visit (HOSPITAL_BASED_OUTPATIENT_CLINIC_OR_DEPARTMENT_OTHER): Payer: Medicare Other | Admitting: Hematology & Oncology

## 2014-01-17 VITALS — BP 129/59 | HR 69 | Temp 98.4°F | Resp 14 | Ht 62.0 in | Wt 132.0 lb

## 2014-01-17 DIAGNOSIS — D649 Anemia, unspecified: Secondary | ICD-10-CM

## 2014-01-17 DIAGNOSIS — M1990 Unspecified osteoarthritis, unspecified site: Secondary | ICD-10-CM

## 2014-01-17 DIAGNOSIS — D472 Monoclonal gammopathy: Secondary | ICD-10-CM

## 2014-01-17 DIAGNOSIS — I10 Essential (primary) hypertension: Secondary | ICD-10-CM

## 2014-01-17 LAB — CBC WITH DIFFERENTIAL (CANCER CENTER ONLY)
BASO#: 0 10*3/uL (ref 0.0–0.2)
BASO%: 0.5 % (ref 0.0–2.0)
EOS ABS: 0.3 10*3/uL (ref 0.0–0.5)
EOS%: 3.3 % (ref 0.0–7.0)
HEMATOCRIT: 33.5 % — AB (ref 34.8–46.6)
HEMOGLOBIN: 11.1 g/dL — AB (ref 11.6–15.9)
LYMPH#: 1.6 10*3/uL (ref 0.9–3.3)
LYMPH%: 18.1 % (ref 14.0–48.0)
MCH: 31.9 pg (ref 26.0–34.0)
MCHC: 33.1 g/dL (ref 32.0–36.0)
MCV: 96 fL (ref 81–101)
MONO#: 1 10*3/uL — AB (ref 0.1–0.9)
MONO%: 10.7 % (ref 0.0–13.0)
NEUT#: 6 10*3/uL (ref 1.5–6.5)
NEUT%: 67.4 % (ref 39.6–80.0)
Platelets: 232 10*3/uL (ref 145–400)
RBC: 3.48 10*6/uL — AB (ref 3.70–5.32)
RDW: 13 % (ref 11.1–15.7)
WBC: 8.8 10*3/uL (ref 3.9–10.0)

## 2014-01-17 LAB — CHCC SATELLITE - SMEAR

## 2014-01-17 NOTE — Progress Notes (Signed)
Hematology and Oncology Follow Up Visit  [name] [id number] 06-25-1929 80 y.o. 01/17/2014   Principle Diagnosis:   IgG MGUS  Current Therapy:    Observation     Interim History:  [name] is is back for followup. Last saw her back in November of last year. At that point on, her monoclonal studies showed a him Spiker 0.31 g/dL. Her IgG level was 832 mg/dL. Her kappa light chain was 2.9 mg/dL.  She enforceably was bitten in the face by a dog about month ago. She required assistance for this. This was the left side of her face. Thankfully there is no facial paralysis. She's had no problems with falling. She's had no nausea vomiting. She's noticed some swelling in her legs. There's been no rashes.  Has been okay. She's watching her salt intake. She does not have any issues with her bowels or bladder.  Medications: Current outpatient prescriptions:aspirin 500 MG EC tablet, Take 1,000 mg by mouth at bedtime as needed. For pain, Disp: , Rfl: ;  beta carotene w/minerals (OCUVITE) tablet, Take 1 tablet by mouth daily., Disp: , Rfl: ;  Cholecalciferol (VITAMIN D) 2000 UNITS CAPS, Take 5,000 Units by mouth daily. , Disp: , Rfl: ;  COD LIVER OIL PO, Take 1 capsule by mouth daily as needed. , Disp: , Rfl:  FLECTOR 1.3 % PTCH, Take 1 patch by mouth daily as needed. For pain, Disp: , Rfl: ;  furosemide (LASIX) 20 MG tablet, Take 20 mg by mouth daily.  , Disp: , Rfl: ;  gabapentin (NEURONTIN) 300 MG capsule, Take 300-600 mg by mouth 3 (three) times daily. Take one tablet in the morning and at noon. Take two tablet at night, Disp: , Rfl: ;  lisinopril (PRINIVIL,ZESTRIL) 20 MG tablet, Take 20 mg by mouth daily.  , Disp: , Rfl:  omeprazole (PRILOSEC) 20 MG capsule, Take 20 mg by mouth as needed. , Disp: , Rfl: ;  Polyethyl Glycol-Propyl Glycol (SYSTANE ULTRA PF) 0.4-0.3 % SOLN, Place 1 drop into both eyes 2 (two) times daily. , Disp: , Rfl: ;  polyethylene glycol (MIRALAX / GLYCOLAX) packet, Take 17 g by  mouth daily as needed. , Disp: , Rfl: ;  potassium chloride (MICRO-K) 10 MEQ CR capsule, Take 1 capsule (10 mEq total) by mouth daily., Disp: 90 capsule, Rfl: 1 Probiotic Product (ALIGN) 4 MG CAPS, Take by mouth. Once daily, Disp: , Rfl: ;  simvastatin (ZOCOR) 20 MG tablet, Take 1 tablet (20 mg total) by mouth at bedtime., Disp: 90 tablet, Rfl: 1;  Teriparatide, Recombinant, (FORTEO [place]), Inject into the skin at bedtime. , Disp: , Rfl: ;  UNKNOWN TO PATIENT, Pt has multiple questions about medications, not sure what she takes at home. Advised to review list with family MD. 01-17-14, Disp: , Rfl:  vitamin C (ASCORBIC ACID) 500 MG tablet, Take 500 mg by mouth daily., Disp: , Rfl: ;  calcium citrate-vitamin D (CITRACAL+D) 315-200 MG-UNIT per tablet, Take 1 tablet by mouth daily. , Disp: , Rfl: ;  pantoprazole (PROTONIX) 40 MG tablet, Take 40 mg by mouth daily. , Disp: , Rfl: ;  traMADol (ULTRAM) 50 MG tablet, Take 50 mg by mouth every 6 (six) hours as needed. For pain, Disp: , Rfl:  vitamin E 400 UNIT capsule, Take 400 Units by mouth daily., Disp: , Rfl:   Allergies:  Allergies  Allergen Reactions  . Zyrtec [Cetirizine Hcl] Hives    Past Medical History, Surgical history, Social history, and  Family History were reviewed and updated.  Review of Systems: As above  Physical Exam:  height is 5\' 2"  (1.575 m) and weight is 132 lb (59.875 kg). Her oral temperature is 98.4 F (36.9 C). Her blood pressure is 129/59 and her pulse is 69. Her respiration is 14.   Petite, elderly white female no obvious distress. Head and neck exam shows the laceration on the left side of her face that is healed. She has no adenopathy. There is no oral lesion. Lungs are clear. Cardiac exam regular in rhythm with no murmurs rubs or bruits. Abdomen is soft. Has good bowel sounds. There is no fluid wave. There is no palpable liver or spleen tip. Back exam no tenderness over the spine ribs or hips. Extremities shows some mild  nonpitting edema the lower legs. She is decent range of motion of her joints. She has good strength. Skin exam no rashes. Neurological exam nonfocal.  Lab Results  Component Value Date   WBC 8.8 01/17/2014   HGB 11.1* 01/17/2014   HCT 33.5* 01/17/2014   MCV 96 01/17/2014   PLT 232 01/17/2014     Chemistry      Component Value Date/Time   NA 138 09/19/2013 1226   K 4.4 09/19/2013 1226   CL 96 09/19/2013 1226   CO2 32 09/19/2013 1226   BUN 29* 09/19/2013 1226   CREATININE 0.91 09/19/2013 1226   CREATININE 0.86 12/01/2012 1523      Component Value Date/Time   CALCIUM 10.4 09/19/2013 1226   ALKPHOS 61 09/19/2013 1226   AST 21 09/19/2013 1226   ALT 11 09/19/2013 1226   BILITOT 0.3 09/19/2013 1226         Impression and Plan: [name] is a 80 year old white female with a MGUS. We have been following her for several years. I noted that she is a little bit more anemic now. It'll be interesting to see what her myeloma studies show.  I dont' think we need any nd x-ray test for right now.  I do want to see her back in about 2 months time just to followup.   Volanda Napoleon, MD 5/27/20153:45 PM

## 2014-01-19 LAB — PROTEIN ELECTROPHORESIS, SERUM, WITH REFLEX
Albumin ELP: 56.7 % (ref 55.8–66.1)
Alpha-1-Globulin: 5.2 % — ABNORMAL HIGH (ref 2.9–4.9)
Alpha-2-Globulin: 12.1 % — ABNORMAL HIGH (ref 7.1–11.8)
BETA 2: 4.7 % (ref 3.2–6.5)
BETA GLOBULIN: 7.5 % — AB (ref 4.7–7.2)
Gamma Globulin: 13.8 % (ref 11.1–18.8)
M-SPIKE, %: 0.27 g/dL
TOTAL PROTEIN, SERUM ELECTROPHOR: 6.4 g/dL (ref 6.0–8.3)

## 2014-01-19 LAB — IGG, IGA, IGM
IGM, SERUM: 315 mg/dL (ref 52–322)
IgA: 252 mg/dL (ref 69–380)
IgG (Immunoglobin G), Serum: 796 mg/dL (ref 690–1700)

## 2014-01-19 LAB — KAPPA/LAMBDA LIGHT CHAINS
KAPPA FREE LGHT CHN: 3.47 mg/dL — AB (ref 0.33–1.94)
Kappa:Lambda Ratio: 1.43 (ref 0.26–1.65)
Lambda Free Lght Chn: 2.42 mg/dL (ref 0.57–2.63)

## 2014-01-19 LAB — IFE INTERPRETATION

## 2014-01-22 ENCOUNTER — Telehealth: Payer: Self-pay | Admitting: *Deleted

## 2014-01-22 NOTE — Telephone Encounter (Addendum)
Message copied by Lenn Sink on Mon Jan 22, 2014 10:31 AM ------      Message from: Volanda Napoleon      Created: Fri Jan 19, 2014  7:59 PM       Call - labs are ok!!  Laurey Arrow ------Informed pt that labs are okay.

## 2014-01-23 ENCOUNTER — Telehealth: Payer: Self-pay | Admitting: *Deleted

## 2014-01-23 ENCOUNTER — Other Ambulatory Visit: Payer: Self-pay | Admitting: *Deleted

## 2014-01-23 NOTE — Telephone Encounter (Signed)
Refill request

## 2014-01-23 NOTE — Telephone Encounter (Signed)
Needs a 90 day refill on her lisinopril, furosemide, simvastatin, omeprazole

## 2014-01-24 MED ORDER — SIMVASTATIN 20 MG PO TABS: 20.0000 mg | ORAL_TABLET | Freq: Every day | ORAL | Status: DC

## 2014-01-24 MED ORDER — PANTOPRAZOLE SODIUM 40 MG PO TBEC: 40.0000 mg | DELAYED_RELEASE_TABLET | Freq: Every day | ORAL | Status: DC

## 2014-01-24 MED ORDER — LISINOPRIL 20 MG PO TABS: 20.0000 mg | ORAL_TABLET | Freq: Every day | ORAL | Status: DC

## 2014-01-24 MED ORDER — FUROSEMIDE 20 MG PO TABS: 20.0000 mg | ORAL_TABLET | Freq: Every day | ORAL | Status: DC

## 2014-02-21 ENCOUNTER — Encounter (HOSPITAL_BASED_OUTPATIENT_CLINIC_OR_DEPARTMENT_OTHER): Payer: Self-pay | Admitting: Emergency Medicine

## 2014-02-21 ENCOUNTER — Emergency Department (HOSPITAL_BASED_OUTPATIENT_CLINIC_OR_DEPARTMENT_OTHER)
Admission: EM | Admit: 2014-02-21 | Discharge: 2014-02-21 | Disposition: A | Discharge status: Home / Self Care | Payer: Medicare Other | Attending: Emergency Medicine | Admitting: Emergency Medicine

## 2014-02-21 ENCOUNTER — Emergency Department (HOSPITAL_BASED_OUTPATIENT_CLINIC_OR_DEPARTMENT_OTHER): Payer: Medicare Other

## 2014-02-21 DIAGNOSIS — Z862 Personal history of diseases of the blood and blood-forming organs and certain disorders involving the immune mechanism: Secondary | ICD-10-CM | POA: Insufficient documentation

## 2014-02-21 DIAGNOSIS — M545 Low back pain: Secondary | ICD-10-CM

## 2014-02-21 DIAGNOSIS — M19029 Primary osteoarthritis, unspecified elbow: Secondary | ICD-10-CM | POA: Insufficient documentation

## 2014-02-21 DIAGNOSIS — M479 Spondylosis, unspecified: Secondary | ICD-10-CM | POA: Insufficient documentation

## 2014-02-21 DIAGNOSIS — I10 Essential (primary) hypertension: Secondary | ICD-10-CM | POA: Insufficient documentation

## 2014-02-21 DIAGNOSIS — Z87828 Personal history of other (healed) physical injury and trauma: Secondary | ICD-10-CM | POA: Insufficient documentation

## 2014-02-21 DIAGNOSIS — K219 Gastro-esophageal reflux disease without esophagitis: Secondary | ICD-10-CM | POA: Insufficient documentation

## 2014-02-21 DIAGNOSIS — M5430 Sciatica, unspecified side: Secondary | ICD-10-CM | POA: Insufficient documentation

## 2014-02-21 DIAGNOSIS — E7800 Pure hypercholesterolemia, unspecified: Secondary | ICD-10-CM | POA: Insufficient documentation

## 2014-02-21 DIAGNOSIS — Z79899 Other long term (current) drug therapy: Secondary | ICD-10-CM | POA: Insufficient documentation

## 2014-02-21 DIAGNOSIS — Z7982 Long term (current) use of aspirin: Secondary | ICD-10-CM | POA: Insufficient documentation

## 2014-02-21 LAB — CBC WITH DIFFERENTIAL/PLATELET
Basophils Absolute: 0 10*3/uL (ref 0.0–0.1)
Basophils Relative: 1 % (ref 0–1)
Eosinophils Absolute: 0.1 10*3/uL (ref 0.0–0.7)
Eosinophils Relative: 2 % (ref 0–5)
HCT: 35.4 % — ABNORMAL LOW (ref 36.0–46.0)
HEMOGLOBIN: 11.8 g/dL — AB (ref 12.0–15.0)
LYMPHS ABS: 1.3 10*3/uL (ref 0.7–4.0)
Lymphocytes Relative: 17 % (ref 12–46)
MCH: 31.2 pg (ref 26.0–34.0)
MCHC: 33.3 g/dL (ref 30.0–36.0)
MCV: 93.7 fL (ref 78.0–100.0)
MONOS PCT: 11 % (ref 3–12)
Monocytes Absolute: 0.8 10*3/uL (ref 0.1–1.0)
NEUTROS ABS: 5.4 10*3/uL (ref 1.7–7.7)
Neutrophils Relative %: 70 % (ref 43–77)
Platelets: 216 10*3/uL (ref 150–400)
RBC: 3.78 MIL/uL — ABNORMAL LOW (ref 3.87–5.11)
RDW: 13.5 % (ref 11.5–15.5)
WBC: 7.7 10*3/uL (ref 4.0–10.5)

## 2014-02-21 LAB — URINALYSIS, ROUTINE W REFLEX MICROSCOPIC
Bilirubin Urine: NEGATIVE
GLUCOSE, UA: NEGATIVE mg/dL
HGB URINE DIPSTICK: NEGATIVE
Ketones, ur: NEGATIVE mg/dL
LEUKOCYTES UA: NEGATIVE
Nitrite: NEGATIVE
Protein, ur: NEGATIVE mg/dL
Specific Gravity, Urine: 1.005 (ref 1.005–1.030)
Urobilinogen, UA: 0.2 mg/dL (ref 0.0–1.0)
pH: 7 (ref 5.0–8.0)

## 2014-02-21 LAB — COMPREHENSIVE METABOLIC PANEL
ALT: 14 U/L (ref 0–35)
AST: 25 U/L (ref 0–37)
Albumin: 4.2 g/dL (ref 3.5–5.2)
Alkaline Phosphatase: 69 U/L (ref 39–117)
Anion gap: 13 (ref 5–15)
BUN: 29 mg/dL — ABNORMAL HIGH (ref 6–23)
CHLORIDE: 95 meq/L — AB (ref 96–112)
CO2: 28 meq/L (ref 19–32)
Calcium: 10 mg/dL (ref 8.4–10.5)
Creatinine, Ser: 0.9 mg/dL (ref 0.50–1.10)
GFR, EST AFRICAN AMERICAN: 67 mL/min — AB (ref >90)
GFR, EST NON AFRICAN AMERICAN: 58 mL/min — AB (ref >90)
GLUCOSE: 96 mg/dL (ref 70–99)
Potassium: 4.3 mEq/L (ref 3.7–5.3)
Sodium: 136 mEq/L — ABNORMAL LOW (ref 137–147)
Total Bilirubin: 0.3 mg/dL (ref 0.3–1.2)
Total Protein: 7.5 g/dL (ref 6.0–8.3)

## 2014-02-21 MED ORDER — KETOROLAC TROMETHAMINE 60 MG/2ML IM SOLN
60.0000 mg | Freq: Once | INTRAMUSCULAR | Status: AC
  Administered 2014-02-21: 60 mg via INTRAMUSCULAR
  Filled 2014-02-21: qty 2

## 2014-02-21 MED ORDER — HYDROCODONE-ACETAMINOPHEN 5-325 MG PO TABS: 2.0000 | ORAL_TABLET | ORAL | Status: DC | PRN

## 2014-02-21 MED ORDER — DICLOFENAC EPOLAMINE 1.3 % TD PTCH: 1.0000 | MEDICATED_PATCH | Freq: Every day | TRANSDERMAL | Status: DC | PRN

## 2014-02-21 NOTE — Discharge Instructions (Signed)
Sciatica Sciatica is pain, weakness, numbness, or tingling along the path of the sciatic nerve. The nerve starts in the lower back and runs down the back of each leg. The nerve controls the muscles in the lower leg and in the back of the knee, while also providing sensation to the back of the thigh, lower leg, and the sole of your foot. Sciatica is a symptom of another medical condition. For instance, nerve damage or certain conditions, such as a herniated disk or bone spur on the spine, pinch or put pressure on the sciatic nerve. This causes the pain, weakness, or other sensations normally associated with sciatica. Generally, sciatica only affects one side of the body. CAUSES   Herniated or slipped disc.  Degenerative disk disease.  A pain disorder involving the narrow muscle in the buttocks (piriformis syndrome).  Pelvic injury or fracture.  Pregnancy.  Tumor (rare). SYMPTOMS  Symptoms can vary from mild to very severe. The symptoms usually travel from the low back to the buttocks and down the back of the leg. Symptoms can include:  Mild tingling or dull aches in the lower back, leg, or hip.  Numbness in the back of the calf or sole of the foot.  Burning sensations in the lower back, leg, or hip.  Sharp pains in the lower back, leg, or hip.  Leg weakness.  Severe back pain inhibiting movement. These symptoms may get worse with coughing, sneezing, laughing, or prolonged sitting or standing. Also, being overweight may worsen symptoms. DIAGNOSIS  Your caregiver will perform a physical exam to look for common symptoms of sciatica. He or she may ask you to do certain movements or activities that would trigger sciatic nerve pain. Other tests may be performed to find the cause of the sciatica. These may include:  Blood tests.  X-rays.  Imaging tests, such as an MRI or CT scan. TREATMENT  Treatment is directed at the cause of the sciatic pain. Sometimes, treatment is not necessary  and the pain and discomfort goes away on its own. If treatment is needed, your caregiver may suggest:  Over-the-counter medicines to relieve pain.  Prescription medicines, such as anti-inflammatory medicine, muscle relaxants, or narcotics.  Applying heat or ice to the painful area.  Steroid injections to lessen pain, irritation, and inflammation around the nerve.  Reducing activity during periods of pain.  Exercising and stretching to strengthen your abdomen and improve flexibility of your spine. Your caregiver may suggest losing weight if the extra weight makes the back pain worse.  Physical therapy.  Surgery to eliminate what is pressing or pinching the nerve, such as a bone spur or part of a herniated disk. HOME CARE INSTRUCTIONS   Only take over-the-counter or prescription medicines for pain or discomfort as directed by your caregiver.  Apply ice to the affected area for 20 minutes, 3-4 times a day for the first 48-72 hours. Then try heat in the same way.  Exercise, stretch, or perform your usual activities if these do not aggravate your pain.  Attend physical therapy sessions as directed by your caregiver.  Keep all follow-up appointments as directed by your caregiver.  Do not wear high heels or shoes that do not provide proper support.  Check your mattress to see if it is too soft. A firm mattress may lessen your pain and discomfort. SEEK IMMEDIATE MEDICAL CARE IF:   You lose control of your bowel or bladder (incontinence).  You have increasing weakness in the lower back, pelvis, buttocks,   or legs.  You have redness or swelling of your back.  You have a burning sensation when you urinate.  You have pain that gets worse when you lie down or awakens you at night.  Your pain is worse than you have experienced in the past.  Your pain is lasting longer than 4 weeks.  You are suddenly losing weight without reason. MAKE SURE YOU:  Understand these  instructions.  Will watch your condition.  Will get help right away if you are not doing well or get worse. Document Released: 08/04/2001 Document Revised: 02/09/2012 Document Reviewed: 12/20/2011 Exit[employer] Patient Information 2015 [employer]. This information is not intended to replace advice given to you by your health care provider. Make sure you discuss any questions you have with your health care provider.  

## 2014-02-21 NOTE — ED Provider Notes (Signed)
CSN: [id number]     Arrival date & time 02/21/14  1213 History   First MD Initiated Contact with Patient 02/21/14 1242     Chief Complaint  Patient presents with  . Back Pain     (Consider location/radiation/quality/duration/timing/severity/associated sxs/prior Treatment) Patient is a 80 y.o. female presenting with back pain. The history is provided by the patient. No language interpreter was used.  Back Pain Location:  Generalized Radiates to:  R thigh and R posterior upper leg Pain severity:  Moderate Pain is:  Unable to specify Onset quality:  Gradual Duration:  3 days Timing:  Constant Progression:  Worsening Chronicity:  New Relieved by:  Nothing Worsened by:  Nothing tried Ineffective treatments:  None tried Associated symptoms: leg pain   Risk factors: hx of osteoporosis     Past Medical History  Diagnosis Date  . Hypertension   . High cholesterol   . Mitral valve disorder   . Diverticulosis   . Blood dyscrasia     IGg  kappa  . GERD (gastroesophageal reflux disease)   . Arthritis     "bad; probably all over; lower back; hands, not RA" (06/23/2012)  . Osteoarthritis   . Fall at home     "fell in the bathtub yesterday; bruised my left arm" (06/23/2012)  . Aortic insufficiency    Past Surgical History  Procedure Laterality Date  . Laparoscopy  08/25/2011    Procedure: LAPAROSCOPY DIAGNOSTIC;  Surgeon: Judieth Keens, DO;  Location: [facility];  Service: General;  Laterality: N/A;  reduction obturator hernia obturator hernia repair  . Patella fracture surgery  2011    right  . Total shoulder arthroplasty  06/23/2012    left  . Vaginal hysterectomy  ~ 1996  . Hernia repair    . Cataract extraction w/ intraocular lens  implant, bilateral  ~ 2003  . Total shoulder arthroplasty  06/23/2012    Procedure: TOTAL SHOULDER ARTHROPLASTY;  Surgeon: Marin Shutter, MD;  Location: [facility];  Service: Orthopedics;  Laterality: Left;  left total shoulder arthroplasty   Family  History  Problem Relation Age of Onset  . Heart disease Father   . Cancer Mother     Breast cancer   History  Substance Use Topics  . Smoking status: Never Smoker   . Smokeless tobacco: Never Used     Comment: never used tobacco  . Alcohol Use: Yes     Comment: Rare   OB History   Grav Para Term Preterm Abortions TAB SAB Ect Mult Living                 Review of Systems  Musculoskeletal: Positive for back pain.  All other systems reviewed and are negative.     Allergies  Zyrtec  Home Medications   Prior to Admission medications   Medication Sig Start Date End Date Taking? Authorizing Provider  aspirin 500 MG EC tablet Take 1,000 mg by mouth at bedtime as needed. For pain    Historical Provider, MD  beta carotene w/minerals (OCUVITE) tablet Take 1 tablet by mouth daily.    Historical Provider, MD  calcium citrate-vitamin D (CITRACAL+D) 315-200 MG-UNIT per tablet Take 1 tablet by mouth daily.     Historical Provider, MD  Cholecalciferol (VITAMIN D) 2000 UNITS CAPS Take 5,000 Units by mouth daily.     Historical Provider, MD  COD LIVER OIL PO Take 1 capsule by mouth daily as needed.     Historical Provider, MD  FLECTOR 1.3 % PTCH Take 1 patch by mouth daily as needed. For pain 04/06/12   Historical Provider, MD  furosemide (LASIX) 20 MG tablet Take 1 tablet (20 mg total) by mouth daily.    Lanice Shirts, MD  gabapentin (NEURONTIN) 300 MG capsule Take 300-600 mg by mouth 3 (three) times daily. Take one tablet in the morning and at noon. Take two tablet at night    Historical Provider, MD  lisinopril (PRINIVIL,ZESTRIL) 20 MG tablet Take 1 tablet (20 mg total) by mouth daily.    Lanice Shirts, MD  omeprazole (PRILOSEC) 20 MG capsule Take 20 mg by mouth as needed.  06/06/13   Historical Provider, MD  pantoprazole (PROTONIX) 40 MG tablet Take 1 tablet (40 mg total) by mouth daily.    Lanice Shirts, MD  Polyethyl Glycol-Propyl Glycol (SYSTANE ULTRA PF) 0.4-0.3 %  SOLN Place 1 drop into both eyes 2 (two) times daily.     Historical Provider, MD  polyethylene glycol (MIRALAX / GLYCOLAX) packet Take 17 g by mouth daily as needed.     Historical Provider, MD  potassium chloride (MICRO-K) 10 MEQ CR capsule Take 1 capsule (10 mEq total) by mouth daily. 11/06/13   Lanice Shirts, MD  Probiotic Product (ALIGN) 4 MG CAPS Take by mouth. Once daily    Historical Provider, MD  simvastatin (ZOCOR) 20 MG tablet Take 1 tablet (20 mg total) by mouth at bedtime.    Lanice Shirts, MD  Teriparatide, Recombinant, (FORTEO [place]) Inject into the skin at bedtime.     Historical Provider, MD  traMADol (ULTRAM) 50 MG tablet Take 50 mg by mouth every 6 (six) hours as needed. For pain 04/26/12   Historical Provider, MD  UNKNOWN TO PATIENT Pt has multiple questions about medications, not sure what she takes at home. Advised to review list with family MD. 01-17-14    Historical Provider, MD  vitamin C (ASCORBIC ACID) 500 MG tablet Take 500 mg by mouth daily.    Historical Provider, MD  vitamin E 400 UNIT capsule Take 400 Units by mouth daily.    Historical Provider, MD   BP 143/77  Pulse 70  Temp(Src) 98.2 F (36.8 C) (Oral)  Resp 14  Ht 5\' 2"  (1.575 m)  Wt 125 lb (56.7 kg)  BMI 22.86 kg/m2  SpO2 96% Physical Exam  Constitutional: She appears well-developed and well-nourished.  HENT:  Head: Normocephalic and atraumatic.  Eyes: Conjunctivae are normal. Pupils are equal, round, and reactive to light.  Neck: Normal range of motion. Neck supple.  Cardiovascular: Normal rate and regular rhythm.   Pulmonary/Chest: Effort normal.  Abdominal: Soft.  Musculoskeletal:  Tender right lower back,  Pain with movement.  nv intact,  No swelling in extremities  Neurological: She is alert.  Skin: Skin is warm.  Psychiatric: She has a normal mood and affect.    ED Course  Procedures (including critical care time) Labs Review Labs Reviewed  CBC WITH DIFFERENTIAL - Abnormal;  Notable for the following:    RBC 3.78 (*)    Hemoglobin 11.8 (*)    HCT 35.4 (*)    All other components within normal limits  COMPREHENSIVE METABOLIC PANEL - Abnormal; Notable for the following:    Sodium 136 (*)    Chloride 95 (*)    BUN 29 (*)    GFR calc non Af Amer 58 (*)    GFR calc Af Amer 67 (*)    All other components  within normal limits  URINALYSIS, ROUTINE W REFLEX MICROSCOPIC    Imaging Review Dg Lumbar Spine Complete  02/21/2014   CLINICAL DATA:  Chronic low back pain with right hip pain and tenderness.  EXAM: LUMBAR SPINE - COMPLETE 4+ VIEW  COMPARISON:  Abdominal pelvic CT 10/12/2013 and 01/12/2012.  FINDINGS: There is transitional lumbosacral anatomy. As correlated with prior radiographs, there are small ribs at T12 and a partially sacralized L5 segment. There is a grade 1 anterolisthesis at L3-4 which appears stable. Convex left scoliosis, multilevel degenerative disc and facet disease appear unchanged. There is no evidence acute fracture.  There are posttraumatic deformities of the right sacrum and right pubic rami related to old fractures. There are secondary degenerative changes at the symphysis pubis and right sacroiliac joint.  IMPRESSION: Stable chronic posttraumatic deformities in the pelvis, convex left scoliosis and multilevel spondylosis. No acute osseous findings.   Electronically Signed   By: Camie Patience M.D.   On: 02/21/2014 13:39   Dg Pelvis 1-2 Views  02/21/2014   CLINICAL DATA:  Right hip pain and tenderness.  EXAM: PELVIS - 1-2 VIEW  COMPARISON:  Single view of the pelvis 11/21/2011. CT abdomen and pelvis 10/12/2013.  FINDINGS: Remote right parasymphyseal pubic bone and right sacral fractures are again seen. No acute fracture is identified. The hips are located. Mild degenerative change is present about the hips. Soft tissue structures are unremarkable.  IMPRESSION: No acute finding. Remote right pubic bone and right sacral fractures. Stable compared to prior  exams.   Electronically Signed   By: Inge Rise M.D.   On: 02/21/2014 13:37     EKG Interpretation None      MDM     Final diagnoses:  Right low back pain, with sciatica presence unspecified    ua negative,   Xrays no acute abnormality, Dr. Christy Gentles in to see and examine Pt is driving.  She does not want to call family.  Pt given a shot of torodol.  Pt request a refill of flector patches.      [place], PA-C 02/21/14 [phone]

## 2014-02-21 NOTE — ED Notes (Signed)
Patient states that she woke up Monday with lower back pain and now states that it radiates down her right leg.

## 2014-02-21 NOTE — ED Provider Notes (Signed)
Patient seen/examined in the Emergency Department in conjunction with Midlevel Provider [place] Patient reports back pain Exam : awake/alert, no distress, able to move lower extremities without difficulty.   Plan: stable for d/c home and outpatient management I doubt acute neurologic process or acute vascular emergency at this time    Sharyon Cable, MD 02/21/14 1433

## 2014-02-22 NOTE — ED Provider Notes (Signed)
Medical screening examination/treatment/procedure(s) were conducted as a shared visit with non-physician practitioner(s) and myself.  I personally evaluated the patient during the encounter.   EKG Interpretation None        Sharyon Cable, MD 02/22/14 [phone]

## 2014-03-03 DIAGNOSIS — L0390 Cellulitis, unspecified: Secondary | ICD-10-CM | POA: Insufficient documentation

## 2014-03-04 ENCOUNTER — Emergency Department (HOSPITAL_BASED_OUTPATIENT_CLINIC_OR_DEPARTMENT_OTHER): Payer: Medicare Other

## 2014-03-04 ENCOUNTER — Encounter (HOSPITAL_BASED_OUTPATIENT_CLINIC_OR_DEPARTMENT_OTHER): Payer: Self-pay | Admitting: Emergency Medicine

## 2014-03-04 ENCOUNTER — Emergency Department (HOSPITAL_BASED_OUTPATIENT_CLINIC_OR_DEPARTMENT_OTHER)
Admission: EM | Admit: 2014-03-04 | Discharge: 2014-03-05 | Disposition: A | Discharge status: Home / Self Care | Payer: Medicare Other | Attending: Emergency Medicine | Admitting: Emergency Medicine

## 2014-03-04 DIAGNOSIS — E7800 Pure hypercholesterolemia, unspecified: Secondary | ICD-10-CM | POA: Insufficient documentation

## 2014-03-04 DIAGNOSIS — S0993XA Unspecified injury of face, initial encounter: Secondary | ICD-10-CM | POA: Insufficient documentation

## 2014-03-04 DIAGNOSIS — Z7982 Long term (current) use of aspirin: Secondary | ICD-10-CM | POA: Insufficient documentation

## 2014-03-04 DIAGNOSIS — R1031 Right lower quadrant pain: Secondary | ICD-10-CM | POA: Insufficient documentation

## 2014-03-04 DIAGNOSIS — Y9389 Activity, other specified: Secondary | ICD-10-CM | POA: Insufficient documentation

## 2014-03-04 DIAGNOSIS — Z79899 Other long term (current) drug therapy: Secondary | ICD-10-CM | POA: Insufficient documentation

## 2014-03-04 DIAGNOSIS — K219 Gastro-esophageal reflux disease without esophagitis: Secondary | ICD-10-CM | POA: Insufficient documentation

## 2014-03-04 DIAGNOSIS — M129 Arthropathy, unspecified: Secondary | ICD-10-CM | POA: Insufficient documentation

## 2014-03-04 DIAGNOSIS — I10 Essential (primary) hypertension: Secondary | ICD-10-CM | POA: Insufficient documentation

## 2014-03-04 DIAGNOSIS — Z791 Long term (current) use of non-steroidal anti-inflammatories (NSAID): Secondary | ICD-10-CM | POA: Insufficient documentation

## 2014-03-04 DIAGNOSIS — Z862 Personal history of diseases of the blood and blood-forming organs and certain disorders involving the immune mechanism: Secondary | ICD-10-CM | POA: Insufficient documentation

## 2014-03-04 DIAGNOSIS — S199XXA Unspecified injury of neck, initial encounter: Secondary | ICD-10-CM

## 2014-03-04 DIAGNOSIS — S0990XA Unspecified injury of head, initial encounter: Secondary | ICD-10-CM | POA: Insufficient documentation

## 2014-03-04 DIAGNOSIS — W2209XA Striking against other stationary object, initial encounter: Secondary | ICD-10-CM | POA: Insufficient documentation

## 2014-03-04 DIAGNOSIS — T148XXA Other injury of unspecified body region, initial encounter: Secondary | ICD-10-CM

## 2014-03-04 DIAGNOSIS — L089 Local infection of the skin and subcutaneous tissue, unspecified: Secondary | ICD-10-CM

## 2014-03-04 DIAGNOSIS — IMO0002 Reserved for concepts with insufficient information to code with codable children: Secondary | ICD-10-CM | POA: Insufficient documentation

## 2014-03-04 DIAGNOSIS — Y929 Unspecified place or not applicable: Secondary | ICD-10-CM | POA: Insufficient documentation

## 2014-03-04 DIAGNOSIS — W19XXXA Unspecified fall, initial encounter: Secondary | ICD-10-CM

## 2014-03-04 LAB — URINALYSIS, ROUTINE W REFLEX MICROSCOPIC
Bilirubin Urine: NEGATIVE
Glucose, UA: NEGATIVE mg/dL
Ketones, ur: 15 mg/dL — AB
Leukocytes, UA: NEGATIVE
NITRITE: NEGATIVE
PH: 7.5 (ref 5.0–8.0)
Protein, ur: NEGATIVE mg/dL
SPECIFIC GRAVITY, URINE: 1.008 (ref 1.005–1.030)
Urobilinogen, UA: 0.2 mg/dL (ref 0.0–1.0)

## 2014-03-04 LAB — COMPREHENSIVE METABOLIC PANEL
ALT: 12 U/L (ref 0–35)
AST: 24 U/L (ref 0–37)
Albumin: 3.9 g/dL (ref 3.5–5.2)
Alkaline Phosphatase: 65 U/L (ref 39–117)
Anion gap: 16 — ABNORMAL HIGH (ref 5–15)
BUN: 20 mg/dL (ref 6–23)
CALCIUM: 10.4 mg/dL (ref 8.4–10.5)
CHLORIDE: 97 meq/L (ref 96–112)
CO2: 25 mEq/L (ref 19–32)
CREATININE: 0.7 mg/dL (ref 0.50–1.10)
GFR calc Af Amer: >90 mL/min (ref >90)
GFR calc non Af Amer: 78 mL/min — ABNORMAL LOW (ref >90)
Glucose, Bld: 105 mg/dL — ABNORMAL HIGH (ref 70–99)
Potassium: 4.2 mEq/L (ref 3.7–5.3)
Sodium: 138 mEq/L (ref 137–147)
Total Bilirubin: 0.3 mg/dL (ref 0.3–1.2)
Total Protein: 7.6 g/dL (ref 6.0–8.3)

## 2014-03-04 LAB — CBC WITH DIFFERENTIAL/PLATELET
BASOS ABS: 0 10*3/uL (ref 0.0–0.1)
BASOS PCT: 0 % (ref 0–1)
Eosinophils Absolute: 0.1 10*3/uL (ref 0.0–0.7)
Eosinophils Relative: 1 % (ref 0–5)
HEMATOCRIT: 34.3 % — AB (ref 36.0–46.0)
Hemoglobin: 11.5 g/dL — ABNORMAL LOW (ref 12.0–15.0)
Lymphocytes Relative: 14 % (ref 12–46)
Lymphs Abs: 1.2 10*3/uL (ref 0.7–4.0)
MCH: 31.7 pg (ref 26.0–34.0)
MCHC: 33.5 g/dL (ref 30.0–36.0)
MCV: 94.5 fL (ref 78.0–100.0)
MONO ABS: 0.9 10*3/uL (ref 0.1–1.0)
Monocytes Relative: 11 % (ref 3–12)
NEUTROS ABS: 5.9 10*3/uL (ref 1.7–7.7)
Neutrophils Relative %: 73 % (ref 43–77)
Platelets: 243 10*3/uL (ref 150–400)
RBC: 3.63 MIL/uL — ABNORMAL LOW (ref 3.87–5.11)
RDW: 13.7 % (ref 11.5–15.5)
WBC: 8.1 10*3/uL (ref 4.0–10.5)

## 2014-03-04 LAB — URINE MICROSCOPIC-ADD ON

## 2014-03-04 LAB — TROPONIN I

## 2014-03-04 MED ORDER — IOHEXOL 300 MG/ML  SOLN: 100.0000 mL | Freq: Once | INTRAMUSCULAR | Status: AC | PRN

## 2014-03-04 NOTE — ED Provider Notes (Signed)
CSN: [id number]     Arrival date & time 03/04/14  2158 History  This chart was scribed for Ezequiel Essex, MD by Jeanell Sparrow, ED Scribe. This patient was seen in room MH04/MH04 and the patient's care was started at 10:37 PM.   Chief Complaint  Patient presents with  . Fall   HPI HPI Comments: [name] is a 80 y.o. female who presents to the Emergency Department complaining of a fall that occurred a few days ago. She states that a storm door hit her left leg. She reports that her leg got caught and she "twisted" her entire body. She reports diffuse severe pain in her entire body. She states that she has pain in her hips, back, abdomen, lower extremities, neck, and chest. She denies any head injury or LOC. She states that her pain keeps her up at night which is why she came to the ED today. She reports that she has had reduced appetite. She states that has a hx of an appendectomy and hysterectomy.  She is a poor historian.  She was seen at urgent care twice and placed on an unknown antibiotic for her leg and has had two doses.  She denies any focal weakness, numbness, tingling, bowel or bladder incontinence.  Past Medical History  Diagnosis Date  . Hypertension   . High cholesterol   . Mitral valve disorder   . Diverticulosis   . Blood dyscrasia     IGg  kappa  . GERD (gastroesophageal reflux disease)   . Arthritis     "bad; probably all over; lower back; hands, not RA" (06/23/2012)  . Osteoarthritis   . Fall at home     "fell in the bathtub yesterday; bruised my left arm" (06/23/2012)  . Aortic insufficiency    Past Surgical History  Procedure Laterality Date  . Laparoscopy  08/25/2011    Procedure: LAPAROSCOPY DIAGNOSTIC;  Surgeon: Judieth Keens, DO;  Location: [facility];  Service: General;  Laterality: N/A;  reduction obturator hernia obturator hernia repair  . Patella fracture surgery  2011    right  . Total shoulder arthroplasty  06/23/2012    left  . Vaginal hysterectomy   ~ 1996  . Hernia repair    . Cataract extraction w/ intraocular lens  implant, bilateral  ~ 2003  . Total shoulder arthroplasty  06/23/2012    Procedure: TOTAL SHOULDER ARTHROPLASTY;  Surgeon: Marin Shutter, MD;  Location: [place];  Service: Orthopedics;  Laterality: Left;  left total shoulder arthroplasty   Family History  Problem Relation Age of Onset  . Heart disease Father   . Cancer Mother     Breast cancer   History  Substance Use Topics  . Smoking status: Never Smoker   . Smokeless tobacco: Never Used     Comment: never used tobacco  . Alcohol Use: Yes     Comment: Rare   OB History   Grav Para Term Preterm Abortions TAB SAB Ect Mult Living                 Review of Systems A complete 10 system review of systems was obtained and all systems are negative except as noted in the HPI and PMH.   Allergies  Zyrtec  Home Medications   Prior to Admission medications   Medication Sig Start Date End Date Taking? Authorizing Provider  aspirin 500 MG EC tablet Take 1,000 mg by mouth at bedtime as needed. For pain    Historical  Provider, MD  beta carotene w/minerals (OCUVITE) tablet Take 1 tablet by mouth daily.    Historical Provider, MD  calcium citrate-vitamin D (CITRACAL+D) 315-200 MG-UNIT per tablet Take 1 tablet by mouth daily.     Historical Provider, MD  cephALEXin (KEFLEX) 500 MG capsule Take 1 capsule (500 mg total) by mouth 4 (four) times daily. 03/05/14   Ezequiel Essex, MD  Cholecalciferol (VITAMIN D) 2000 UNITS CAPS Take 5,000 Units by mouth daily.     Historical Provider, MD  COD LIVER OIL PO Take 1 capsule by mouth daily as needed.     Historical Provider, MD  diclofenac (FLECTOR) 1.3 % PTCH Place 1 patch onto the skin daily as needed. For pain 02/21/14   Fransico Meadow, PA-C  furosemide (LASIX) 20 MG tablet Take 1 tablet (20 mg total) by mouth daily.    Lanice Shirts, MD  gabapentin (NEURONTIN) 300 MG capsule Take 300-600 mg by mouth 3 (three) times daily.  Take one tablet in the morning and at noon. Take two tablet at night    Historical Provider, MD  HYDROcodone-acetaminophen (NORCO/VICODIN) 5-325 MG per tablet Take 2 tablets by mouth every 4 (four) hours as needed. 02/21/14   Fransico Meadow, PA-C  lisinopril (PRINIVIL,ZESTRIL) 20 MG tablet Take 1 tablet (20 mg total) by mouth daily.    Lanice Shirts, MD  omeprazole (PRILOSEC) 20 MG capsule Take 20 mg by mouth as needed.  06/06/13   Historical Provider, MD  pantoprazole (PROTONIX) 40 MG tablet Take 1 tablet (40 mg total) by mouth daily.    Lanice Shirts, MD  Polyethyl Glycol-Propyl Glycol (SYSTANE ULTRA PF) 0.4-0.3 % SOLN Place 1 drop into both eyes 2 (two) times daily.     Historical Provider, MD  polyethylene glycol (MIRALAX / GLYCOLAX) packet Take 17 g by mouth daily as needed.     Historical Provider, MD  potassium chloride (MICRO-K) 10 MEQ CR capsule Take 1 capsule (10 mEq total) by mouth daily. 11/06/13   Lanice Shirts, MD  Probiotic Product (ALIGN) 4 MG CAPS Take by mouth. Once daily    Historical Provider, MD  simvastatin (ZOCOR) 20 MG tablet Take 1 tablet (20 mg total) by mouth at bedtime.    Lanice Shirts, MD  sulfamethoxazole-trimethoprim (SEPTRA DS) 800-160 MG per tablet Take 1 tablet by mouth 2 (two) times daily. 03/05/14   Ezequiel Essex, MD  Teriparatide, Recombinant, (FORTEO [place]) Inject into the skin at bedtime.     Historical Provider, MD  traMADol (ULTRAM) 50 MG tablet Take 50 mg by mouth every 6 (six) hours as needed. For pain 04/26/12   Historical Provider, MD  UNKNOWN TO PATIENT Pt has multiple questions about medications, not sure what she takes at home. Advised to review list with family MD. 01-17-14    Historical Provider, MD  vitamin C (ASCORBIC ACID) 500 MG tablet Take 500 mg by mouth daily.    Historical Provider, MD  vitamin E 400 UNIT capsule Take 400 Units by mouth daily.    Historical Provider, MD   BP 175/88  Pulse 76  Temp(Src) 98.2 F (36.8 C)  (Oral)  Resp 16  Ht 5\' 1"  (1.549 m)  Wt 130 lb (58.968 kg)  BMI 24.58 kg/m2  SpO2 94% Physical Exam  Nursing note and vitals reviewed. Constitutional: She is oriented to person, place, and time. She appears well-developed and well-nourished. No distress.  HENT:  Head: Normocephalic and atraumatic.  Mouth/Throat: Oropharynx is clear and  moist.  Eyes: Conjunctivae and EOM are normal. Pupils are equal, round, and reactive to light.  Neck: Normal range of motion. Neck supple.  Cardiovascular: Normal rate, regular rhythm and normal heart sounds.   No murmur heard. Pulmonary/Chest: Effort normal and breath sounds normal. No respiratory distress.  Abdominal: Soft. There is tenderness. There is no rebound and no guarding.  RLQ tenderness.   Musculoskeletal: Normal range of motion. She exhibits tenderness. She exhibits no edema.  Diffuse c spine and lumbar spine tenderness. 5/5 strength in bilateral lower extremities. Ankle plantar and dorsiflexion intact. Great toe extension intact bilaterally. +2 DP and PT pulses. +2 patellar reflexes bilaterally. Normal gait.   Neurological: She is alert and oriented to person, place, and time. No cranial nerve deficit. She exhibits normal muscle tone. Coordination normal.  Skin: Skin is warm. No rash noted. There is erythema.  Skin tear to left shin with surrounding erythema. See photo       ED Course  Procedures (including critical care time) DIAGNOSTIC STUDIES: Oxygen Saturation is 94% on RA, normal by my interpretation.    COORDINATION OF CARE: 10:41 PM- Pt advised of plan for treatment which includes radiology and labs and pt agrees.  Labs Review Labs Reviewed  CBC WITH DIFFERENTIAL - Abnormal; Notable for the following:    RBC 3.63 (*)    Hemoglobin 11.5 (*)    HCT 34.3 (*)    All other components within normal limits  COMPREHENSIVE METABOLIC PANEL - Abnormal; Notable for the following:    Glucose, Bld 105 (*)    GFR calc non Af Amer 78  (*)    Anion gap 16 (*)    All other components within normal limits  URINALYSIS, ROUTINE W REFLEX MICROSCOPIC - Abnormal; Notable for the following:    Hgb urine dipstick TRACE (*)    Ketones, ur 15 (*)    All other components within normal limits  URINE MICROSCOPIC-ADD ON - Abnormal; Notable for the following:    Squamous Epithelial / LPF FEW (*)    All other components within normal limits  TROPONIN I    Imaging Review Ct Head Wo Contrast  03/05/2014   CLINICAL DATA:  Pain post trauma  EXAM: CT HEAD WITHOUT CONTRAST  CT CERVICAL SPINE WITHOUT CONTRAST  TECHNIQUE: Multidetector CT imaging of the head and cervical spine was performed following the standard protocol without intravenous contrast. Multiplanar CT image reconstructions of the cervical spine were also generated.  COMPARISON:  November 22, 2012 head CT and November 21, 2011 cervical spine CT  FINDINGS: CT HEAD FINDINGS  There is mild diffuse atrophy. There is no mass, hemorrhage, extra-axial fluid collection, or midline shift. There is patchy small vessel disease in the centra semiovale bilaterally. There is evidence of a prior infarct in the posterior limb of the left internal capsule as well as in the anterior limb of the left external capsule, stable. There is no new gray-white compartment lesion. No acute appearing infarct. The bony calvarium appears intact. The mastoid air cells are clear. There is hyperostosis frontalis bilaterally.  CT CERVICAL FINDINGS  There is no demonstrable fracture. There is slight anterolisthesis of C7 on T1, slight anterolisthesis T1 on T2, and slight anterolisthesis of T2 on T3, felt to be due to underlying spondylosis. Prevertebral soft tissues and predental space regions are normal. There is calcification of the ligamentum flava posteriorly from C2-3 to C4-5, more on the left than on the right. There is moderately severe disc space narrowing  at C3-4. There is slightly milder narrowing at C4-5 and C5-6. There is  facet hypertrophy at multiple levels with exit foraminal narrowing to varying degrees at multiple levels bilaterally. There is no frank disc extrusion. There is no high-grade stenosis. There is calcification in the left carotid artery.  IMPRESSION: CT HEAD: Atrophy with small vessel disease and prior small infarcts. No acute appearing infarct. No hemorrhage or mass effect.  CT CERVICAL SPINE: Extensive arthropathic change. No fracture. Areas of mild spondylolisthesis in the upper thoracic region or felt to be due to spondylosis. No new spondylolisthesis seen.   Electronically Signed   By: Lowella Grip M.D.   On: 03/05/2014 00:32   Ct Cervical Spine Wo Contrast  03/05/2014   CLINICAL DATA:  Pain post trauma  EXAM: CT HEAD WITHOUT CONTRAST  CT CERVICAL SPINE WITHOUT CONTRAST  TECHNIQUE: Multidetector CT imaging of the head and cervical spine was performed following the standard protocol without intravenous contrast. Multiplanar CT image reconstructions of the cervical spine were also generated.  COMPARISON:  November 22, 2012 head CT and November 21, 2011 cervical spine CT  FINDINGS: CT HEAD FINDINGS  There is mild diffuse atrophy. There is no mass, hemorrhage, extra-axial fluid collection, or midline shift. There is patchy small vessel disease in the centra semiovale bilaterally. There is evidence of a prior infarct in the posterior limb of the left internal capsule as well as in the anterior limb of the left external capsule, stable. There is no new gray-white compartment lesion. No acute appearing infarct. The bony calvarium appears intact. The mastoid air cells are clear. There is hyperostosis frontalis bilaterally.  CT CERVICAL FINDINGS  There is no demonstrable fracture. There is slight anterolisthesis of C7 on T1, slight anterolisthesis T1 on T2, and slight anterolisthesis of T2 on T3, felt to be due to underlying spondylosis. Prevertebral soft tissues and predental space regions are normal. There is  calcification of the ligamentum flava posteriorly from C2-3 to C4-5, more on the left than on the right. There is moderately severe disc space narrowing at C3-4. There is slightly milder narrowing at C4-5 and C5-6. There is facet hypertrophy at multiple levels with exit foraminal narrowing to varying degrees at multiple levels bilaterally. There is no frank disc extrusion. There is no high-grade stenosis. There is calcification in the left carotid artery.  IMPRESSION: CT HEAD: Atrophy with small vessel disease and prior small infarcts. No acute appearing infarct. No hemorrhage or mass effect.  CT CERVICAL SPINE: Extensive arthropathic change. No fracture. Areas of mild spondylolisthesis in the upper thoracic region or felt to be due to spondylosis. No new spondylolisthesis seen.   Electronically Signed   By: Lowella Grip M.D.   On: 03/05/2014 00:32   Ct Abdomen Pelvis W Contrast  03/05/2014   CLINICAL DATA:  Right lower quadrant pain after recent fall  EXAM: CT ABDOMEN AND PELVIS WITH CONTRAST  TECHNIQUE: Multidetector CT imaging of the abdomen and pelvis was performed using the standard protocol following bolus administration of intravenous contrast.  CONTRAST:  126mL OMNIPAQUE IOHEXOL 300 MG/ML  SOLN  COMPARISON:  02/29/2015  FINDINGS: Note that head and cervical spine imaging is included on this accession, but is dictated separately.  BODY WALL: Unremarkable.  LOWER CHEST: Coronary atherosclerosis.  ABDOMEN/PELVIS:  Liver: Stable appearance is scattered sub cm low-dense hepatic lesions, likely cysts.  Biliary: Cholelithiasis.  No evidence of acute cholecystitis.  Pancreas: Small cystic spaces within the pancreatic body are not well resolved compared to  previous imaging, especially MRI 01/22/2012. No evidence of enlarging or solid mass. No evidence of inflammation.  Spleen: 3 cm or smaller low dense lesions are stable from previous and considered incidental/ benign.  Adrenals: Unremarkable.  Kidneys and  ureters: No evidence of injury.  2 cm right renal cyst.  Bladder: Stable appearance.  Reproductive: Hysterectomy.  Bowel: Extensive distal colonic diverticulosis. No active inflammation. No bowel inflammation or obstruction. Negative appendix.  Retroperitoneum: No mass or adenopathy.  Peritoneum: No ascites or pneumoperitoneum.  Vascular: No acute abnormality.  OSSEOUS: Remote, malunited right obturator ring fracture. Remote right sacral ala fracture. No acute fracture identified. Lumbar levoscoliosis and L4-5 anterolisthesis. The slip is secondary to severe facet osteoarthritis. Listhesis, disc bulging, and posterior element overgrowth causes advanced canal stenosis at L4-5 and L5-S1.  IMPRESSION: 1. No evidence of intra-abdominal injury or fracture. 2. Chronic findings are stable from prior and noted above.   Electronically Signed   By: Jorje Guild M.D.   On: 03/05/2014 00:35     EKG Interpretation   Date/Time:  Sunday March 04 2014 23:07:35 EDT Ventricular Rate:  87 PR Interval:  172 QRS Duration: 66 QT Interval:  366 QTC Calculation: 440 R Axis:   -12 Text Interpretation:  Sinus rhythm with Premature atrial complexes with  Abberant conduction Low voltage QRS Cannot rule out Anterior infarct , age  undetermined Abnormal ECG No significant change was found Confirmed by  Wyvonnia Dusky  MD, Nakaila Freeze [phone]) on 03/04/2014 11:14:59 PM      MDM   Final diagnoses:  Fall, initial encounter  Infected skin tear  RLQ abdominal pain   patient is a poor historian. She had a fall several days ago and injured her head, neck, low back and left leg. She reports she was seen at urgent care and started on an unknown antibiotic for infection to her left leg. She denies any syncope, dizziness or lightheadedness. Neurologically intact.  Drove herself to ED.  EKG nsr. Labs appear to be at baseline.  Will obtain imaging of head and C spine.  Abdominal imaging will be obtained as well given her RLQ pain. Plain  films of left leg.  Expect discharge with antibiotics for infected skin tear if able to ambulate safely and imaging reassuring.  Dr. Christy Gentles to disposition after radiology results.  I personally performed the services described in this documentation, which was scribed in my presence. The recorded information has been reviewed and is accurate.      Ezequiel Essex, MD 03/05/14 [phone]

## 2014-03-04 NOTE — ED Notes (Signed)
MD at bedside. 

## 2014-03-04 NOTE — ED Notes (Signed)
Pt reports she had a fall several weeks ago, got seen and all scans were negative.  Reports several days ago right leg 'gave way' and she fell again sustaining injury to right knee and left leg.  Having diffuse body pain since numerous falls.

## 2014-03-05 ENCOUNTER — Emergency Department (HOSPITAL_BASED_OUTPATIENT_CLINIC_OR_DEPARTMENT_OTHER): Payer: Medicare Other

## 2014-03-05 MED ORDER — CEPHALEXIN 500 MG PO CAPS: 500.0000 mg | ORAL_CAPSULE | Freq: Four times a day (QID) | ORAL | Status: DC

## 2014-03-05 MED ORDER — SULFAMETHOXAZOLE-TRIMETHOPRIM 800-160 MG PO TABS: 1.0000 | ORAL_TABLET | Freq: Two times a day (BID) | ORAL | Status: DC

## 2014-03-05 MED ORDER — IOHEXOL 300 MG/ML  SOLN
100.0000 mL | Freq: Once | INTRAMUSCULAR | Status: AC | PRN
  Administered 2014-03-05: 100 mL via INTRAVENOUS

## 2014-03-05 MED ORDER — TETANUS-DIPHTH-ACELL PERTUSSIS 5-2.5-18.5 LF-MCG/0.5 IM SUSP
0.5000 mL | Freq: Once | INTRAMUSCULAR | Status: DC
  Filled 2014-03-05: qty 0.5

## 2014-03-05 NOTE — ED Notes (Signed)
Patient told me of a wound that needed attention on her left lower leg. I applied a thick application of bacitracin, then 2x2's  And covered with 4x4's with a kerlix wrap for padding. I also gave patient additional wound care supplies.

## 2014-03-05 NOTE — ED Provider Notes (Signed)
IMAGING NEGATIVE PT AMBULATORY, NO DISTRESS STABLE FOR D/C HOME  Sharyon Cable, MD 03/05/14 [phone]

## 2014-03-05 NOTE — Discharge Instructions (Signed)
Cellulitis Cellulitis is an infection of the skin and the tissue beneath it. The infected area is usually red and tender. Cellulitis occurs most often in the arms and lower legs.  CAUSES  Cellulitis is caused by bacteria that enter the skin through cracks or cuts in the skin. The most common types of bacteria that cause cellulitis are Staphylococcus and Streptococcus. SYMPTOMS   Redness and warmth.  Swelling.  Tenderness or pain.  Fever. DIAGNOSIS  Your caregiver can usually determine what is wrong based on a physical exam. Blood tests may also be done. TREATMENT  Treatment usually involves taking an antibiotic medicine. HOME CARE INSTRUCTIONS   Take your antibiotics as directed. Finish them even if you start to feel better.  Keep the infected arm or leg elevated to reduce swelling.  Apply a warm cloth to the affected area up to 4 times per day to relieve pain.  Only take over-the-counter or prescription medicines for pain, discomfort, or fever as directed by your caregiver.  Keep all follow-up appointments as directed by your caregiver. SEEK MEDICAL CARE IF:   You notice red streaks coming from the infected area.  Your red area gets larger or turns dark in color.  Your bone or joint underneath the infected area becomes painful after the skin has healed.  Your infection returns in the same area or another area.  You notice a swollen bump in the infected area.  You develop new symptoms. SEEK IMMEDIATE MEDICAL CARE IF:   You have a fever.  You feel very sleepy.  You develop vomiting or diarrhea.  You have a general ill feeling (malaise) with muscle aches and pains. MAKE SURE YOU:   Understand these instructions.  Will watch your condition.  Will get help right away if you are not doing well or get worse. Document Released: 05/20/2005 Document Revised: 02/09/2012 Document Reviewed: 10/26/2011 [employer] Patient Information 2015 [employer]. This information is  not intended to replace advice given to you by your health care provider. Make sure you discuss any questions you have with your health care provider.  

## 2014-03-05 NOTE — ED Notes (Signed)
Assumed care of patient from Kristen, RN.

## 2014-03-05 NOTE — ED Notes (Signed)
Pt states she had her Td updated in April. Chart reviewed and pt did have her Td in April. MD aware and order received to d/c Td order.

## 2014-03-15 ENCOUNTER — Encounter: Payer: Medicare Other | Admitting: Internal Medicine

## 2014-03-16 ENCOUNTER — Telehealth: Payer: Self-pay | Admitting: *Deleted

## 2014-03-16 NOTE — Telephone Encounter (Signed)
Pt called to let us know that she did not show up for appt 7/23 due to a fall and  had injured her back & knee.Reported that she went to urgent care due to the extreme pain she was in  where they did x-ray's but did not find anything . They gave her 10 tabs of HYROCODONE  Which she has already went thru and is not getting any better .States that she is in severe pain & would like to be seen .Explained to her that DR.not in till Monday ,she will call back to make appt After she has found transportation and try to be seen .In mean time told her that if pain got worse to call ambulance to come get her to be seen if she could not find a ride.

## 2014-03-21 ENCOUNTER — Ambulatory Visit (HOSPITAL_BASED_OUTPATIENT_CLINIC_OR_DEPARTMENT_OTHER): Payer: Medicare Other | Admitting: Hematology & Oncology

## 2014-03-21 ENCOUNTER — Ambulatory Visit (HOSPITAL_BASED_OUTPATIENT_CLINIC_OR_DEPARTMENT_OTHER): Payer: Medicare Other | Admitting: Lab

## 2014-03-21 VITALS — BP 148/72 | HR 79 | Temp 98.0°F | Resp 16 | Wt 129.0 lb

## 2014-03-21 DIAGNOSIS — D649 Anemia, unspecified: Secondary | ICD-10-CM

## 2014-03-21 DIAGNOSIS — D472 Monoclonal gammopathy: Secondary | ICD-10-CM

## 2014-03-21 DIAGNOSIS — D509 Iron deficiency anemia, unspecified: Secondary | ICD-10-CM

## 2014-03-21 LAB — CBC WITH DIFFERENTIAL (CANCER CENTER ONLY)
BASO#: 0 10*3/uL (ref 0.0–0.2)
BASO%: 0.5 % (ref 0.0–2.0)
EOS ABS: 0.1 10*3/uL (ref 0.0–0.5)
EOS%: 1.6 % (ref 0.0–7.0)
HCT: 33.8 % — ABNORMAL LOW (ref 34.8–46.6)
HGB: 11.2 g/dL — ABNORMAL LOW (ref 11.6–15.9)
LYMPH#: 1.6 10*3/uL (ref 0.9–3.3)
LYMPH%: 19.5 % (ref 14.0–48.0)
MCH: 32 pg (ref 26.0–34.0)
MCHC: 33.1 g/dL (ref 32.0–36.0)
MCV: 97 fL (ref 81–101)
MONO#: 0.9 10*3/uL (ref 0.1–0.9)
MONO%: 11.1 % (ref 0.0–13.0)
NEUT%: 67.3 % (ref 39.6–80.0)
NEUTROS ABS: 5.5 10*3/uL (ref 1.5–6.5)
PLATELETS: 214 10*3/uL (ref 145–400)
RBC: 3.5 10*6/uL — AB (ref 3.70–5.32)
RDW: 14.1 % (ref 11.1–15.7)
WBC: 8.1 10*3/uL (ref 3.9–10.0)

## 2014-03-21 NOTE — Progress Notes (Signed)
Hematology and Oncology Follow Up Visit  [name] [id number] November 21, 1928 80 y.o. 03/21/2014   Principle Diagnosis:  IgG Kappa MGUS  Current Therapy:    Observation     Interim History:  [name] is back for followup. It seems like every time we see her, she has a problem. She apparently twisted her legs. She strained her right leg. There was no fracture. She does have a bandage around the  leftt lower leg. She did go to an urgent care center. She's had x-rays were done.  Overall, she has had no problems with [name]. I think the MGUS is reflective of her medications. She is on quite a few medications.  We'll we last saw her, monoclonal spike was 0.27 g/dL. Her Ig G. level was 789 mg/dL.  She's had no fever. She's had no infections. She's had no change in bowel bladder habits.  She's not sure when her last mammogram was.  She's not sure about the medication that she is taking.  Medications: Current outpatient prescriptions:aspirin 500 MG EC tablet, Take 1,000 mg by mouth at bedtime as needed. For pain, Disp: , Rfl: ;  beta carotene w/minerals (OCUVITE) tablet, Take 1 tablet by mouth daily., Disp: , Rfl: ;  calcium citrate-vitamin D (CITRACAL+D) 315-200 MG-UNIT per tablet, Take 1 tablet by mouth daily. , Disp: , Rfl:  cephALEXin (KEFLEX) 500 MG capsule, Take 1 capsule (500 mg total) by mouth 4 (four) times daily., Disp: 40 capsule, Rfl: 0;  Cholecalciferol (VITAMIN D) 2000 UNITS CAPS, Take 5,000 Units by mouth daily. , Disp: , Rfl: ;  COD LIVER OIL PO, Take 1 capsule by mouth daily as needed. , Disp: , Rfl: ;  diclofenac (FLECTOR) 1.3 % PTCH, Place 1 patch onto the skin daily as needed. For pain, Disp: 10 patch, Rfl: 0 furosemide (LASIX) 20 MG tablet, Take 1 tablet (20 mg total) by mouth daily., Disp: 90 tablet, Rfl: 1;  gabapentin (NEURONTIN) 300 MG capsule, Take 300-600 mg by mouth 3 (three) times daily. Take one tablet in the morning and at noon. Take two tablet at night, Disp: ,  Rfl: ;  HYDROcodone-acetaminophen (NORCO/VICODIN) 5-325 MG per tablet, Take 2 tablets by mouth every 4 (four) hours as needed., Disp: 20 tablet, Rfl: 0 lisinopril (PRINIVIL,ZESTRIL) 20 MG tablet, Take 1 tablet (20 mg total) by mouth daily., Disp: 90 tablet, Rfl: 1;  omeprazole (PRILOSEC) 20 MG capsule, Take 20 mg by mouth as needed. , Disp: , Rfl: ;  pantoprazole (PROTONIX) 40 MG tablet, Take 1 tablet (40 mg total) by mouth daily., Disp: 90 tablet, Rfl: 1;  Polyethyl Glycol-Propyl Glycol (SYSTANE ULTRA PF) 0.4-0.3 % SOLN, Place 1 drop into both eyes 2 (two) times daily. , Disp: , Rfl:  polyethylene glycol (MIRALAX / GLYCOLAX) packet, Take 17 g by mouth daily as needed. , Disp: , Rfl: ;  potassium chloride (MICRO-K) 10 MEQ CR capsule, Take 1 capsule (10 mEq total) by mouth daily., Disp: 90 capsule, Rfl: 1;  Probiotic Product (ALIGN) 4 MG CAPS, Take by mouth. Once daily, Disp: , Rfl: ;  simvastatin (ZOCOR) 20 MG tablet, Take 1 tablet (20 mg total) by mouth at bedtime., Disp: 90 tablet, Rfl: 1 sulfamethoxazole-trimethoprim (SEPTRA DS) 800-160 MG per tablet, Take 1 tablet by mouth 2 (two) times daily., Disp: 28 tablet, Rfl: 0;  Teriparatide, Recombinant, (FORTEO [place]), Inject into the skin at bedtime. , Disp: , Rfl: ;  traMADol (ULTRAM) 50 MG tablet, Take 50 mg by mouth every 6 (  six) hours as needed. For pain, Disp: , Rfl:  UNKNOWN TO PATIENT, Pt has multiple questions about medications, not sure what she takes at home. Advised to review list with family MD. 01-17-14, Disp: , Rfl: ;  vitamin C (ASCORBIC ACID) 500 MG tablet, Take 500 mg by mouth daily., Disp: , Rfl: ;  vitamin E 400 UNIT capsule, Take 400 Units by mouth daily., Disp: , Rfl:   Allergies:  Allergies  Allergen Reactions  . Zyrtec [Cetirizine Hcl] Hives    Past Medical History, Surgical history, Social history, and Family History were reviewed and updated.  Review of Systems: As above  Physical Exam:  weight is 129 lb (58.514 kg). Her oral  temperature is 98 F (36.7 C). Her blood pressure is 148/72 and her pulse is 79. Her respiration is 16.   Thin, elderly white female. Head and neck exam shows no ocular or oral lesions. She has no palpable cervical or supraclavicular lymph nodes. Lungs are clear. Cardiac exam regular in rhythm. Abdomen is soft. Has good bowel sounds. There is no fluid wave. There is no palpable liver or spleen. Back exam no tenderness over the spine. Extremities shows no clubbing cyanosis or edema. She has bandage about the left lower leg. No rashes or abrasions are noted. Neurological exam is nonfocal.  Lab Results  Component Value Date   WBC 8.1 03/21/2014   HGB 11.2* 03/21/2014   HCT 33.8* 03/21/2014   MCV 97 03/21/2014   PLT 214 03/21/2014     Chemistry      Component Value Date/Time   NA 138 03/04/2014 2301   K 4.2 03/04/2014 2301   CL 97 03/04/2014 2301   CO2 25 03/04/2014 2301   BUN 20 03/04/2014 2301   CREATININE 0.70 03/04/2014 2301   CREATININE 0.91 09/19/2013 1226      Component Value Date/Time   CALCIUM 10.4 03/04/2014 2301   ALKPHOS 65 03/04/2014 2301   AST 24 03/04/2014 2301   ALT 12 03/04/2014 2301   BILITOT 0.3 03/04/2014 2301         Impression and Plan: [name] is 80 year old white female. She has an MGUS. We have been following her for this for a few years. This is never been shown to be a problem.  She is mildly anemic. There's been chronic. She's asymptomatic. I really don't think we have to be too aggressive in pursuing this.  I will see her back in about 3 months.   Volanda Napoleon, MD 7/29/20157:12 PM

## 2014-03-22 LAB — IRON AND TIBC CHCC
%SAT: 23 % (ref 21–57)
Iron: 84 ug/dL (ref 41–142)
TIBC: 360 ug/dL (ref 236–444)
UIBC: 276 ug/dL (ref 120–384)

## 2014-03-22 LAB — FERRITIN CHCC: Ferritin: 34 ng/ml (ref 9–269)

## 2014-03-26 ENCOUNTER — Telehealth: Payer: Self-pay | Admitting: *Deleted

## 2014-03-26 LAB — COMPREHENSIVE METABOLIC PANEL
ALT: 9 U/L (ref 0–35)
AST: 23 U/L (ref 0–37)
Albumin: 4.1 g/dL (ref 3.5–5.2)
Alkaline Phosphatase: 54 U/L (ref 39–117)
BUN: 22 mg/dL (ref 6–23)
CALCIUM: 9.6 mg/dL (ref 8.4–10.5)
CO2: 28 mEq/L (ref 19–32)
Chloride: 99 mEq/L (ref 96–112)
Creatinine, Ser: 0.89 mg/dL (ref 0.50–1.10)
Glucose, Bld: 85 mg/dL (ref 70–99)
Potassium: 4.2 mEq/L (ref 3.5–5.3)
Sodium: 136 mEq/L (ref 135–145)
Total Bilirubin: 0.3 mg/dL (ref 0.2–1.2)
Total Protein: 6.7 g/dL (ref 6.0–8.3)

## 2014-03-26 LAB — KAPPA/LAMBDA LIGHT CHAINS
KAPPA LAMBDA RATIO: 1.08 (ref 0.26–1.65)
Kappa free light chain: 2.56 mg/dL — ABNORMAL HIGH (ref 0.33–1.94)
LAMBDA FREE LGHT CHN: 2.37 mg/dL (ref 0.57–2.63)

## 2014-03-26 LAB — IGG, IGA, IGM
IGM, SERUM: 283 mg/dL (ref 52–322)
IgA: 261 mg/dL (ref 69–380)
IgG (Immunoglobin G), Serum: 806 mg/dL (ref 690–1700)

## 2014-03-26 LAB — RETICULOCYTES (CHCC)
ABS Retic: 38.4 10*3/uL (ref 19.0–186.0)
RBC.: 3.49 MIL/uL — AB (ref 3.87–5.11)
Retic Ct Pct: 1.1 % (ref 0.4–2.3)

## 2014-03-26 LAB — PROTEIN ELECTROPHORESIS, SERUM, WITH REFLEX
Albumin ELP: 57.4 % (ref 55.8–66.1)
Alpha-1-Globulin: 5.4 % — ABNORMAL HIGH (ref 2.9–4.9)
Alpha-2-Globulin: 12.1 % — ABNORMAL HIGH (ref 7.1–11.8)
Beta 2: 4.8 % (ref 3.2–6.5)
Beta Globulin: 7.2 % (ref 4.7–7.2)
Gamma Globulin: 13.1 % (ref 11.1–18.8)
M-SPIKE, %: 0.31 g/dL
Total Protein, Serum Electrophoresis: 6.7 g/dL (ref 6.0–8.3)

## 2014-03-26 LAB — IFE INTERPRETATION

## 2014-03-26 NOTE — Telephone Encounter (Addendum)
Message copied by Lenn Sink on Mon Mar 26, 2014 11:42 AM ------      Message from: Volanda Napoleon      Created: Sun Mar 25, 2014  8:20 PM       Call - labs look ok!!  pete ------Left voicemail informing pt that labs look good.

## 2014-05-01 ENCOUNTER — Ambulatory Visit: Payer: Self-pay | Admitting: Podiatry

## 2014-05-02 ENCOUNTER — Encounter: Payer: Self-pay | Admitting: Podiatry

## 2014-05-02 ENCOUNTER — Ambulatory Visit: Payer: Self-pay | Admitting: Podiatry

## 2014-05-02 ENCOUNTER — Ambulatory Visit (INDEPENDENT_AMBULATORY_CARE_PROVIDER_SITE_OTHER): Payer: Medicare Other | Admitting: Podiatry

## 2014-05-02 VITALS — BP 142/70 | HR 66 | Ht 62.0 in | Wt 129.0 lb

## 2014-05-02 DIAGNOSIS — M79606 Pain in leg, unspecified: Secondary | ICD-10-CM

## 2014-05-02 DIAGNOSIS — B351 Tinea unguium: Secondary | ICD-10-CM | POA: Insufficient documentation

## 2014-05-02 DIAGNOSIS — M79609 Pain in unspecified limb: Secondary | ICD-10-CM

## 2014-05-02 NOTE — Patient Instructions (Signed)
Seen for painful nails. All nails debrided. Return in 3 months.

## 2014-05-02 NOTE — Progress Notes (Signed)
SUBJECTIVE: 80 y.o. year old female presents for painful toe nails on both big toes.    REVIEW OF SYSTEMS: Constitutional: negative for chills, fatigue, fevers, night sweats and sweats Eyes: negative Ears, nose, mouth, throat, and face: negative Respiratory: negative Cardiovascular: negative Gastrointestinal: negative Genitourinary:negative Musculoskeletal:Stiff and achy joints everywhere.  Neurological: negative.  OBJECTIVE: DERMATOLOGIC EXAMINATION: Nails: Thick dystrophic nails x 10.  Skin Integrity: Poor skin texture with scabbed shin left lower limb from recent fall.  VASCULAR EXAMINATION OF LOWER LIMBS: Pedal pulses: Pedal pulses are not palpable. Capillary Filling times within 3 seconds in all digits.  Edema: Mild ankle edema noted bilateral. No Ischemic changes seen. Temperature gradient from tibial crest to dorsum of foot is within normal bilateral. NEUROLOGIC EXAMINATION OF THE LOWER LIMBS: Monofilament (Semmes-Weinstein 10-gm) sensory testing positive 6 out of 6, bilateral. Sharp and Dull discriminatory sensations at the plantar ball of hallux is intact bilateral.  MUSCULOSKELETAL EXAMINATION: No gross deformities seen.  ASSESSMENT: Onychogryphosis both great toes. Mycotic nails x 10.  Painful toes. PVD.  PLAN: Reviewed findings. All nails debrided.  Return in 3 months or as needed.

## 2014-05-10 ENCOUNTER — Encounter: Payer: Self-pay | Admitting: Internal Medicine

## 2014-05-10 ENCOUNTER — Other Ambulatory Visit: Payer: Self-pay | Admitting: Internal Medicine

## 2014-05-10 ENCOUNTER — Ambulatory Visit (INDEPENDENT_AMBULATORY_CARE_PROVIDER_SITE_OTHER): Payer: Medicare Other | Admitting: Internal Medicine

## 2014-05-10 ENCOUNTER — Ambulatory Visit (HOSPITAL_BASED_OUTPATIENT_CLINIC_OR_DEPARTMENT_OTHER)
Admission: RE | Admit: 2014-05-10 | Discharge: 2014-05-10 | Disposition: A | Discharge status: Home / Self Care | Payer: Medicare Other | Source: Ambulatory Visit | Attending: Internal Medicine | Admitting: Internal Medicine

## 2014-05-10 VITALS — BP 148/83 | HR 80 | Temp 98.0°F | Resp 16 | Ht 62.0 in | Wt 131.0 lb

## 2014-05-10 DIAGNOSIS — M899 Disorder of bone, unspecified: Secondary | ICD-10-CM | POA: Diagnosis not present

## 2014-05-10 DIAGNOSIS — T07XXXA Unspecified multiple injuries, initial encounter: Secondary | ICD-10-CM | POA: Diagnosis not present

## 2014-05-10 DIAGNOSIS — W19XXXA Unspecified fall, initial encounter: Secondary | ICD-10-CM | POA: Diagnosis not present

## 2014-05-10 DIAGNOSIS — R079 Chest pain, unspecified: Secondary | ICD-10-CM | POA: Insufficient documentation

## 2014-05-10 DIAGNOSIS — Z23 Encounter for immunization: Secondary | ICD-10-CM

## 2014-05-10 DIAGNOSIS — R531 Weakness: Secondary | ICD-10-CM

## 2014-05-10 DIAGNOSIS — M25519 Pain in unspecified shoulder: Secondary | ICD-10-CM | POA: Insufficient documentation

## 2014-05-10 DIAGNOSIS — R0781 Pleurodynia: Secondary | ICD-10-CM

## 2014-05-10 DIAGNOSIS — M25511 Pain in right shoulder: Secondary | ICD-10-CM

## 2014-05-10 DIAGNOSIS — M949 Disorder of cartilage, unspecified: Secondary | ICD-10-CM | POA: Diagnosis not present

## 2014-05-10 DIAGNOSIS — R5383 Other fatigue: Secondary | ICD-10-CM

## 2014-05-10 DIAGNOSIS — R5381 Other malaise: Secondary | ICD-10-CM

## 2014-05-10 NOTE — Progress Notes (Signed)
   Subjective:    Patient ID: [name], female    DOB: 09/04/1928, 80 y.o.   MRN: [id number]  HPI  Hematology last visit  [name] is 80 year old white female. She has an MGUS. We have been following her for this for a few years. This is never been shown to be a problem.  She is mildly anemic. There's been chronic. She's asymptomatic. I really don't think we have to be too aggressive in pursuing this.  I will see her back in about 3 months.  My last visit   HTN continue meds off HCTZ  Hyperlipidemia Continue statin  AI Evaluated by cardiology with ECHO Pending.  Incidental pancreatic mass. Ct appears consistant with cyst. No change from 2013  Schedule CPE with me See prn  Today's  Visit  [name] is having R sided chest and rib pain since her fall back in July.   She tells me she tripped near the doorlanding.   No dizziness, no syncope .  She landed on her right side.   At first she thought it was her breast .  She does not feel a mass in her breast.   She also has pain in her right shoulder.    She does tell me she had recently seen her rheumatologist Dr. Ouida Sills.     Review of Systems    see HPI. Objective:   Physical Exam Physical Exam  Nursing note and vitals reviewed.  Constitutional: She is oriented to person, place, and time. She appears well-developed and well-nourished.  HENT:  Head: Normocephalic and atraumatic.  Cardiovascular: Normal rate and regular rhythm. Exam reveals no gallop and no friction rub.  No murmur heard.  Pulmonary/Chest: Breath sounds normal. She has no wheezes. She has no rales.  Breast exam  No signs of traum no bruising  Careful exam reveals no mass no nipple discharge no axillary adenopathy bilaterally  She does  Have pain along Right ribs when palpated R shoulder she has a skin patch in place limited ROM due to pain Neurological: She is alert and oriented to person, place, and time.  Skin: Skin is warm and dry.  Psychiatric: She has a  normal mood and affect. Her behavior is normal.       Assessment & Plan:  Right sided rib pain will get rib imaging today  Right shoulder pain  Will need Dr.Anderson's note  And will get imaging today  Will also set up with PT for balance/ambulation assessment and strengthening.      Influenza vaccine today  See me as needed

## 2014-05-10 NOTE — Patient Instructions (Signed)
Have pt stop by PT top schedule first appointment will refer  To xray today   See me as needed   - keep appointment for your physical

## 2014-05-14 ENCOUNTER — Telehealth: Payer: Self-pay | Admitting: Internal Medicine

## 2014-05-14 ENCOUNTER — Telehealth: Payer: Self-pay

## 2014-05-14 NOTE — Telephone Encounter (Signed)
Spoke with pt and informed of xray results     Will need MRI of right shoulder but pt going to [place] to visit daughter and will now be back until 10/3 .  Will schedule after that.  Pt voices understanding

## 2014-05-14 NOTE — Telephone Encounter (Signed)
[name] [phone]  Hilliary called back to say it was okay to call her daughter but she is really hard to get a hold of, because she teaches at the hospital. Sometimes she does not call her mom back till 9:00 at night.

## 2014-05-24 ENCOUNTER — Encounter: Payer: Self-pay | Admitting: *Deleted

## 2014-05-28 ENCOUNTER — Telehealth: Payer: Self-pay | Admitting: Internal Medicine

## 2014-05-28 NOTE — Telephone Encounter (Signed)
Left message on daughter [employer] mobile to call office to update with mother's condition

## 2014-05-29 ENCOUNTER — Telehealth: Payer: Self-pay | Admitting: Internal Medicine

## 2014-05-29 NOTE — Telephone Encounter (Signed)
Left message on pts voicemail to call office regarding shoulder xray

## 2014-05-29 NOTE — Telephone Encounter (Signed)
Had long conversation with Cyndee Brightly pts daughter  Expressing my concern over recent falling episodes.  Daughter is an Dentist IT person living in [place] and travels frequently   When I expressed concern and suggested she have family checking on pt.  Daugher states mother has "undiagnosed borderline personality D/O" mother is paranoid and will not seek mental health evaluation.   She states mother has hoarding issues and Marcie Bal has two brothers with mental health issues.    Pt has been resistant to any home assessment with RN or social work both with me and with daughter

## 2014-05-31 ENCOUNTER — Telehealth: Payer: Self-pay | Admitting: Internal Medicine

## 2014-05-31 NOTE — Telephone Encounter (Signed)
Left message on voice mail to call office regarding shoulder xray

## 2014-06-06 ENCOUNTER — Telehealth: Payer: Self-pay | Admitting: Internal Medicine

## 2014-06-06 NOTE — Telephone Encounter (Signed)
Attempted again to call pt regarding shoulder xray results  Left voice mail   I sent certified letter to pt with xray result

## 2014-06-27 ENCOUNTER — Ambulatory Visit (HOSPITAL_BASED_OUTPATIENT_CLINIC_OR_DEPARTMENT_OTHER): Payer: Medicare Other | Admitting: Hematology & Oncology

## 2014-06-27 ENCOUNTER — Encounter: Payer: Self-pay | Admitting: Hematology & Oncology

## 2014-06-27 ENCOUNTER — Ambulatory Visit (HOSPITAL_BASED_OUTPATIENT_CLINIC_OR_DEPARTMENT_OTHER): Payer: Medicare Other | Admitting: Lab

## 2014-06-27 VITALS — BP 101/51 | HR 70 | Temp 98.4°F | Resp 14 | Ht 62.0 in | Wt 129.0 lb

## 2014-06-27 DIAGNOSIS — D472 Monoclonal gammopathy: Secondary | ICD-10-CM

## 2014-06-27 DIAGNOSIS — D649 Anemia, unspecified: Secondary | ICD-10-CM

## 2014-06-27 DIAGNOSIS — D509 Iron deficiency anemia, unspecified: Secondary | ICD-10-CM

## 2014-06-27 LAB — CBC WITH DIFFERENTIAL (CANCER CENTER ONLY)
BASO#: 0 10*3/uL (ref 0.0–0.2)
BASO%: 0.3 % (ref 0.0–2.0)
EOS%: 1.5 % (ref 0.0–7.0)
Eosinophils Absolute: 0.1 10*3/uL (ref 0.0–0.5)
HCT: 36.8 % (ref 34.8–46.6)
HGB: 12.2 g/dL (ref 11.6–15.9)
LYMPH#: 1.7 10*3/uL (ref 0.9–3.3)
LYMPH%: 21.5 % (ref 14.0–48.0)
MCH: 32.7 pg (ref 26.0–34.0)
MCHC: 33.2 g/dL (ref 32.0–36.0)
MCV: 99 fL (ref 81–101)
MONO#: 0.9 10*3/uL (ref 0.1–0.9)
MONO%: 11.4 % (ref 0.0–13.0)
NEUT#: 5.2 10*3/uL (ref 1.5–6.5)
NEUT%: 65.3 % (ref 39.6–80.0)
PLATELETS: 204 10*3/uL (ref 145–400)
RBC: 3.73 10*6/uL (ref 3.70–5.32)
RDW: 13.4 % (ref 11.1–15.7)
WBC: 8 10*3/uL (ref 3.9–10.0)

## 2014-06-27 LAB — CHCC SATELLITE - SMEAR

## 2014-06-27 NOTE — Progress Notes (Signed)
Hematology and Oncology Follow Up Visit  Noha Milberger [id number] 11/03/1928 80 y.o. 06/27/2014   Principle Diagnosis:  IgG Kappa MGUS  Current Therapy:    Observation     Interim History:  Ms.  Gigante is back for followup.she is doing much better. She's not hurting that much anymore.  She has had a good autumn since we last saw her.  We last saw her back in July. She was still recovering from a fall that she had. We saw her, her monoclonal spike was 0.31 g/L. Her IgG level was 806 mg/dL.  Her iron studies showed a ferritin of 34 with a iron saturation of 23%.  Rapid titer has been good. She's had no problems with bowels or bladder. Patient wants to try to exercise a little more. I told her that exercise would help her bones more than anything else. She does take calcium.   Medications: Current outpatient prescriptions: aspirin 500 MG EC tablet, Take 1,000 mg by mouth at bedtime as needed. For pain, Disp: , Rfl: ;  calcium citrate-vitamin D (CITRACAL+D) 315-200 MG-UNIT per tablet, Take 1 tablet by mouth daily. , Disp: , Rfl: ;  Cholecalciferol (VITAMIN D) 2000 UNITS CAPS, Take 5,000 Units by mouth daily. , Disp: , Rfl: ;  diclofenac (FLECTOR) 1.3 % PTCH, Place 1 patch onto the skin daily as needed. For pain, Disp: 10 patch, Rfl: 0 furosemide (LASIX) 20 MG tablet, Take 1 tablet (20 mg total) by mouth daily., Disp: 90 tablet, Rfl: 1;  gabapentin (NEURONTIN) 300 MG capsule, Take 300-600 mg by mouth 3 (three) times daily. Take one tablet in the morning and at noon. Take two tablet at night, Disp: , Rfl: ;  HYDROcodone-acetaminophen (NORCO/VICODIN) 5-325 MG per tablet, Take 2 tablets by mouth every 4 (four) hours as needed., Disp: 20 tablet, Rfl: 0 lisinopril (PRINIVIL,ZESTRIL) 20 MG tablet, Take 1 tablet (20 mg total) by mouth daily., Disp: 90 tablet, Rfl: 1;  pantoprazole (PROTONIX) 40 MG tablet, Take 1 tablet (40 mg total) by mouth daily., Disp: 90 tablet, Rfl: 1;  Polyethyl Glycol-Propyl  Glycol (SYSTANE ULTRA PF) 0.4-0.3 % SOLN, Place 1 drop into both eyes 2 (two) times daily. , Disp: , Rfl:  potassium chloride (MICRO-K) 10 MEQ CR capsule, Take 1 capsule (10 mEq total) by mouth daily., Disp: 90 capsule, Rfl: 1;  Probiotic Product (ALIGN) 4 MG CAPS, Take by mouth. Once daily, Disp: , Rfl: ;  simvastatin (ZOCOR) 20 MG tablet, Take 1 tablet (20 mg total) by mouth at bedtime., Disp: 90 tablet, Rfl: 1;  traMADol (ULTRAM) 50 MG tablet, Take 50 mg by mouth every 6 (six) hours as needed. For pain, Disp: , Rfl:  vitamin C (ASCORBIC ACID) 500 MG tablet, Take 500 mg by mouth daily., Disp: , Rfl: ;  vitamin E 400 UNIT capsule, Take 400 Units by mouth daily., Disp: , Rfl: ;  COD LIVER OIL PO, Take 1 capsule by mouth daily as needed. , Disp: , Rfl: ;  polyethylene glycol (MIRALAX / GLYCOLAX) packet, Take 17 g by mouth daily as needed. , Disp: , Rfl:   Allergies:  Allergies  Allergen Reactions  . Zyrtec [Cetirizine Hcl] Hives  . Cetirizine Hives    Past Medical History, Surgical history, Social history, and Family History were reviewed and updated.  Review of Systems: As above  Physical Exam:  height is 5\' 2"  (1.575 m) and weight is 129 lb (58.514 kg). Her oral temperature is 98.4 F (36.9 C). Her blood pressure is  101/51 and her pulse is 70. Her respiration is 14.   Thin, elderly white female. Head and neck exam shows no ocular or oral lesions. She has no palpable cervical or supraclavicular lymph nodes. Lungs are clear. Cardiac exam regular rate and rhythm. Abdomen is soft. She has good bowel sounds. There is no fluid wave. There is no palpable liver or spleen. Back exam no tenderness over the spine.she has some kyphosis. Extremities shows no clubbing cyanosis or edema. Her left lower leg seems to be healing up quite nicely. She still has a couple eschars where she injured her lower leg. Neurological exam is nonfocal. Skin exam shows no rashes. This still a little bit of erythema about the  left lower leg near the ankle. Lab Results  Component Value Date   WBC 8.0 06/27/2014   HGB 12.2 06/27/2014   HCT 36.8 06/27/2014   MCV 99 06/27/2014   PLT 204 06/27/2014     Chemistry      Component Value Date/Time   NA 136 03/21/2014 1452   K 4.2 03/21/2014 1452   CL 99 03/21/2014 1452   CO2 28 03/21/2014 1452   BUN 22 03/21/2014 1452   CREATININE 0.89 03/21/2014 1452   CREATININE 0.91 09/19/2013 1226      Component Value Date/Time   CALCIUM 9.6 03/21/2014 1452   ALKPHOS 54 03/21/2014 1452   AST 23 03/21/2014 1452   ALT 9 03/21/2014 1452   BILITOT 0.3 03/21/2014 1452         Impression and Plan: Ms. Nachreiner is 80 year old white female. She has an  IgG Kappa MGUS. We have been following her for this for a few years. This is never been shown to be a problem. Her MGUS level has been stable.  Her anemia has resolved.. She occasionally has some anemia.  We check her iron studies. She's asymptomatic. I really don't think we have to be too aggressive in pursuing this.  I will see her back in about  6 months.  Volanda Napoleon, MD 11/4/20154:58 PM

## 2014-06-28 ENCOUNTER — Telehealth: Payer: Self-pay | Admitting: Hematology & Oncology

## 2014-06-28 LAB — IRON AND TIBC CHCC
%SAT: 25 % (ref 21–57)
Iron: 96 ug/dL (ref 41–142)
TIBC: 389 ug/dL (ref 236–444)
UIBC: 293 ug/dL (ref 120–384)

## 2014-06-28 LAB — FERRITIN CHCC: Ferritin: 20 ng/ml (ref 9–269)

## 2014-06-28 NOTE — Telephone Encounter (Signed)
Mailed may schedule

## 2014-06-29 LAB — IGG, IGA, IGM
IGA: 288 mg/dL (ref 69–380)
IgG (Immunoglobin G), Serum: 879 mg/dL (ref 690–1700)
IgM, Serum: 283 mg/dL (ref 52–322)

## 2014-06-29 LAB — KAPPA/LAMBDA LIGHT CHAINS
KAPPA FREE LGHT CHN: 3.35 mg/dL — AB (ref 0.33–1.94)
Kappa:Lambda Ratio: 1.51 (ref 0.26–1.65)
LAMBDA FREE LGHT CHN: 2.22 mg/dL (ref 0.57–2.63)

## 2014-06-29 LAB — COMPREHENSIVE METABOLIC PANEL
ALK PHOS: 54 U/L (ref 39–117)
ALT: 10 U/L (ref 0–35)
AST: 24 U/L (ref 0–37)
Albumin: 4.2 g/dL (ref 3.5–5.2)
BUN: 32 mg/dL — ABNORMAL HIGH (ref 6–23)
CALCIUM: 9.2 mg/dL (ref 8.4–10.5)
CO2: 28 mEq/L (ref 19–32)
CREATININE: 0.9 mg/dL (ref 0.50–1.10)
Chloride: 100 mEq/L (ref 96–112)
Glucose, Bld: 86 mg/dL (ref 70–99)
Potassium: 5 mEq/L (ref 3.5–5.3)
Sodium: 139 mEq/L (ref 135–145)
Total Bilirubin: 0.3 mg/dL (ref 0.2–1.2)
Total Protein: 6.7 g/dL (ref 6.0–8.3)

## 2014-06-29 LAB — IFE INTERPRETATION

## 2014-06-29 LAB — PROTEIN ELECTROPHORESIS, SERUM, WITH REFLEX
ALPHA-1-GLOBULIN: 4.6 % (ref 2.9–4.9)
ALPHA-2-GLOBULIN: 12 % — AB (ref 7.1–11.8)
Albumin ELP: 56.8 % (ref 55.8–66.1)
Beta 2: 5.5 % (ref 3.2–6.5)
Beta Globulin: 7.1 % (ref 4.7–7.2)
Gamma Globulin: 14 % (ref 11.1–18.8)
M-SPIKE, %: 0.24 g/dL
Total Protein, Serum Electrophoresis: 6.7 g/dL (ref 6.0–8.3)

## 2014-07-11 ENCOUNTER — Telehealth: Payer: Self-pay

## 2014-07-11 NOTE — Telephone Encounter (Signed)
Mailed Certified Letter on 52/71/29

## 2014-07-16 ENCOUNTER — Other Ambulatory Visit: Payer: Self-pay | Admitting: *Deleted

## 2014-07-16 MED ORDER — PANTOPRAZOLE SODIUM 40 MG PO TBEC: 40.0000 mg | DELAYED_RELEASE_TABLET | Freq: Every day | ORAL | Status: AC

## 2014-07-16 MED ORDER — FUROSEMIDE 20 MG PO TABS: 20.0000 mg | ORAL_TABLET | Freq: Every day | ORAL | Status: AC

## 2014-07-16 NOTE — Telephone Encounter (Signed)
Refill request

## 2014-07-23 ENCOUNTER — Other Ambulatory Visit: Payer: Self-pay | Admitting: *Deleted

## 2014-07-23 DIAGNOSIS — D472 Monoclonal gammopathy: Secondary | ICD-10-CM

## 2014-07-23 DIAGNOSIS — M1990 Unspecified osteoarthritis, unspecified site: Secondary | ICD-10-CM

## 2014-07-23 MED ORDER — POTASSIUM CHLORIDE ER 10 MEQ PO CPCR: 10.0000 meq | ORAL_CAPSULE | Freq: Every day | ORAL | Status: AC

## 2014-07-23 NOTE — Telephone Encounter (Signed)
Refill request

## 2014-08-01 ENCOUNTER — Encounter: Payer: Self-pay | Admitting: Podiatry

## 2014-08-01 ENCOUNTER — Ambulatory Visit: Payer: Medicare Other | Admitting: Podiatry

## 2014-08-01 ENCOUNTER — Ambulatory Visit (INDEPENDENT_AMBULATORY_CARE_PROVIDER_SITE_OTHER): Payer: Medicare Other | Admitting: Podiatry

## 2014-08-01 VITALS — BP 105/61 | HR 75

## 2014-08-01 DIAGNOSIS — M79606 Pain in leg, unspecified: Secondary | ICD-10-CM

## 2014-08-01 DIAGNOSIS — B351 Tinea unguium: Secondary | ICD-10-CM

## 2014-08-01 NOTE — Progress Notes (Signed)
SUBJECTIVE: 80 y.o. year old female presents for painful toe nails on both big toes.   OBJECTIVE: DERMATOLOGIC EXAMINATION: Nails: Thick dystrophic nails x 10.  Mild inflamed skin with dry scab over left shin from old skin injury.   VASCULAR EXAMINATION OF LOWER LIMBS: Pedal pulses: Pedal pulses are not palpable. Capillary Filling times within 3 seconds in all digits.  NEUROLOGIC EXAMINATION OF THE LOWER LIMBS: Monofilament (Semmes-Weinstein 10-gm) sensory testing positive 6 out of 6, bilateral. Sharp and Dull discriminatory sensations at the plantar ball of hallux is intact bilateral.  MUSCULOSKELETAL EXAMINATION: No gross deformities seen.  ASSESSMENT: Onychogryphosis both great toes. Mycotic nails x 10.  Painful toes. PVD.  PLAN: All nails debrided and grinded. Return in 3 months or as needed.

## 2014-08-01 NOTE — Patient Instructions (Signed)
Seen for hypertrophic nails. All nails debrided. Return in 3 months or as needed.  

## 2014-08-22 ENCOUNTER — Encounter (HOSPITAL_BASED_OUTPATIENT_CLINIC_OR_DEPARTMENT_OTHER): Payer: Self-pay | Admitting: *Deleted

## 2014-08-22 ENCOUNTER — Emergency Department (HOSPITAL_BASED_OUTPATIENT_CLINIC_OR_DEPARTMENT_OTHER)
Admission: EM | Admit: 2014-08-22 | Discharge: 2014-08-22 | Disposition: A | Discharge status: Home / Self Care | Payer: Medicare Other | Attending: Emergency Medicine | Admitting: Emergency Medicine

## 2014-08-22 DIAGNOSIS — M79605 Pain in left leg: Secondary | ICD-10-CM | POA: Diagnosis present

## 2014-08-22 DIAGNOSIS — L03116 Cellulitis of left lower limb: Secondary | ICD-10-CM | POA: Insufficient documentation

## 2014-08-22 DIAGNOSIS — K219 Gastro-esophageal reflux disease without esophagitis: Secondary | ICD-10-CM | POA: Diagnosis not present

## 2014-08-22 DIAGNOSIS — E785 Hyperlipidemia, unspecified: Secondary | ICD-10-CM | POA: Insufficient documentation

## 2014-08-22 DIAGNOSIS — I10 Essential (primary) hypertension: Secondary | ICD-10-CM | POA: Insufficient documentation

## 2014-08-22 DIAGNOSIS — M1990 Unspecified osteoarthritis, unspecified site: Secondary | ICD-10-CM | POA: Diagnosis not present

## 2014-08-22 DIAGNOSIS — Z7982 Long term (current) use of aspirin: Secondary | ICD-10-CM | POA: Diagnosis not present

## 2014-08-22 DIAGNOSIS — Z791 Long term (current) use of non-steroidal anti-inflammatories (NSAID): Secondary | ICD-10-CM | POA: Diagnosis not present

## 2014-08-22 DIAGNOSIS — Z79899 Other long term (current) drug therapy: Secondary | ICD-10-CM | POA: Insufficient documentation

## 2014-08-22 LAB — CBC WITH DIFFERENTIAL/PLATELET
BASOS PCT: 0 % (ref 0–1)
Basophils Absolute: 0 10*3/uL (ref 0.0–0.1)
EOS PCT: 1 % (ref 0–5)
Eosinophils Absolute: 0.1 10*3/uL (ref 0.0–0.7)
HEMATOCRIT: 34.1 % — AB (ref 36.0–46.0)
HEMOGLOBIN: 11.1 g/dL — AB (ref 12.0–15.0)
LYMPHS ABS: 1.3 10*3/uL (ref 0.7–4.0)
Lymphocytes Relative: 14 % (ref 12–46)
MCH: 32 pg (ref 26.0–34.0)
MCHC: 32.6 g/dL (ref 30.0–36.0)
MCV: 98.3 fL (ref 78.0–100.0)
MONO ABS: 1.1 10*3/uL — AB (ref 0.1–1.0)
Monocytes Relative: 12 % (ref 3–12)
NEUTROS PCT: 73 % (ref 43–77)
Neutro Abs: 6.9 10*3/uL (ref 1.7–7.7)
Platelets: 224 10*3/uL (ref 150–400)
RBC: 3.47 MIL/uL — AB (ref 3.87–5.11)
RDW: 12.9 % (ref 11.5–15.5)
WBC: 9.4 10*3/uL (ref 4.0–10.5)

## 2014-08-22 LAB — COMPREHENSIVE METABOLIC PANEL
ALBUMIN: 4 g/dL (ref 3.5–5.2)
ALK PHOS: 58 U/L (ref 39–117)
ALT: 15 U/L (ref 0–35)
ANION GAP: 8 (ref 5–15)
AST: 29 U/L (ref 0–37)
BUN: 27 mg/dL — ABNORMAL HIGH (ref 6–23)
CALCIUM: 9.4 mg/dL (ref 8.4–10.5)
CO2: 27 mmol/L (ref 19–32)
CREATININE: 0.84 mg/dL (ref 0.50–1.10)
Chloride: 103 mEq/L (ref 96–112)
GFR calc Af Amer: 72 mL/min — ABNORMAL LOW (ref >90)
GFR calc non Af Amer: 63 mL/min — ABNORMAL LOW (ref >90)
Glucose, Bld: 127 mg/dL — ABNORMAL HIGH (ref 70–99)
Potassium: 4.5 mmol/L (ref 3.5–5.1)
Sodium: 138 mmol/L (ref 135–145)
TOTAL PROTEIN: 7.3 g/dL (ref 6.0–8.3)
Total Bilirubin: 0.3 mg/dL (ref 0.3–1.2)

## 2014-08-22 MED ORDER — CLINDAMYCIN HCL 300 MG PO CAPS: 300.0000 mg | ORAL_CAPSULE | Freq: Four times a day (QID) | ORAL | Status: DC

## 2014-08-22 MED ORDER — CLINDAMYCIN HCL 150 MG PO CAPS
ORAL_CAPSULE | ORAL | Status: AC
  Administered 2014-08-22: 300 mg via ORAL
  Filled 2014-08-22: qty 2

## 2014-08-22 MED ORDER — CLINDAMYCIN HCL 150 MG PO CAPS
300.0000 mg | ORAL_CAPSULE | Freq: Once | ORAL | Status: AC
  Administered 2014-08-22: 300 mg via ORAL

## 2014-08-22 NOTE — ED Provider Notes (Signed)
CSN: [id number]     Arrival date & time 08/22/14  1845 History  This chart was scribed for Dot Lanes, MD by Peyton Bottoms, ED Scribe. This patient was seen in room MH07/MH07 and the patient's care was started at 7:43 PM.   Chief Complaint  Patient presents with  . Leg Pain   Patient is a 80 y.o. female presenting with leg pain. The history is provided by the patient. No language interpreter was used.  Leg Pain Location:  Ankle and foot Time since incident:  2 months Ankle location:  L ankle Foot location:  L foot Pain details:    Quality:  Aching  HPI Comments: [name] is a 80 y.o. female with a PMHx of hypertension, diverticulosis, GERD, arthritis, osteoarthritis, and right patella fracture surgery, who presents to the Emergency Department complaining of left leg pain that began 2 months ago after patient had a fall. She reports associated erythema and drainage to wound on left shin. Patient has an abscess on left lower extremity. She states that she has been applying neosporin to affected area. She denies associated fevers or chills. She states "something is wrong with my circulation and my hand turns blue sometimes". Patient does not currently have a PCP.   Past Medical History  Diagnosis Date  . Hypertension   . High cholesterol   . Mitral valve disorder   . Diverticulosis   . Blood dyscrasia     IGg  kappa  . GERD (gastroesophageal reflux disease)   . Arthritis     "bad; probably all over; lower back; hands, not RA" (06/23/2012)  . Osteoarthritis   . Fall at home     "fell in the bathtub yesterday; bruised my left arm" (06/23/2012)  . Aortic insufficiency    Past Surgical History  Procedure Laterality Date  . Laparoscopy  08/25/2011    Procedure: LAPAROSCOPY DIAGNOSTIC;  Surgeon: Judieth Keens, DO;  Location: [facility];  Service: General;  Laterality: N/A;  reduction obturator hernia obturator hernia repair  . Patella fracture surgery  2011    right  .  Total shoulder arthroplasty  06/23/2012    left  . Vaginal hysterectomy  ~ 1996  . Hernia repair    . Cataract extraction w/ intraocular lens  implant, bilateral  ~ 2003  . Total shoulder arthroplasty  06/23/2012    Procedure: TOTAL SHOULDER ARTHROPLASTY;  Surgeon: Marin Shutter, MD;  Location: [place];  Service: Orthopedics;  Laterality: Left;  left total shoulder arthroplasty   Family History  Problem Relation Age of Onset  . Heart disease Father   . Cancer Mother     Breast cancer   History  Substance Use Topics  . Smoking status: Never Smoker   . Smokeless tobacco: Never Used     Comment: never used tobacco  . Alcohol Use: 0.0 oz/week    0 Not specified per week     Comment: Rare   OB History    No data available     Review of Systems  All other systems reviewed and are negative.  Allergies  Zyrtec and Cetirizine  Home Medications   Prior to Admission medications   Medication Sig Start Date End Date Taking? Authorizing Provider  aspirin 500 MG EC tablet Take 1,000 mg by mouth at bedtime as needed. For pain   Yes Historical Provider, MD  calcium citrate-vitamin D (CITRACAL+D) 315-200 MG-UNIT per tablet Take 1 tablet by mouth daily.    Yes Historical  Provider, MD  Cholecalciferol (VITAMIN D) 2000 UNITS CAPS Take 5,000 Units by mouth daily.    Yes Historical Provider, MD  diclofenac (FLECTOR) 1.3 % PTCH Place 1 patch onto the skin daily as needed. For pain 02/21/14  Yes Fransico Meadow, PA-C  furosemide (LASIX) 20 MG tablet Take 1 tablet (20 mg total) by mouth daily. 07/16/14  Yes Lanice Shirts, MD  gabapentin (NEURONTIN) 300 MG capsule Take 300-600 mg by mouth 3 (three) times daily. Take one tablet in the morning and at noon. Take two tablet at night   Yes Historical Provider, MD  HYDROcodone-acetaminophen (NORCO/VICODIN) 5-325 MG per tablet Take 2 tablets by mouth every 4 (four) hours as needed. 02/21/14  Yes Hollace Kinnier Sofia, PA-C  lisinopril (PRINIVIL,ZESTRIL) 20 MG  tablet Take 1 tablet (20 mg total) by mouth daily.   Yes Lanice Shirts, MD  pantoprazole (PROTONIX) 40 MG tablet Take 1 tablet (40 mg total) by mouth daily. 07/16/14  Yes Lanice Shirts, MD  Polyethyl Glycol-Propyl Glycol (SYSTANE ULTRA PF) 0.4-0.3 % SOLN Place 1 drop into both eyes 2 (two) times daily.    Yes Historical Provider, MD  polyethylene glycol (MIRALAX / GLYCOLAX) packet Take 17 g by mouth daily as needed.    Yes Historical Provider, MD  potassium chloride (MICRO-K) 10 MEQ CR capsule Take 1 capsule (10 mEq total) by mouth daily. 07/23/14  Yes Lanice Shirts, MD  Probiotic Product (ALIGN) 4 MG CAPS Take by mouth. Once daily   Yes Historical Provider, MD  simvastatin (ZOCOR) 20 MG tablet Take 1 tablet (20 mg total) by mouth at bedtime.   Yes Lanice Shirts, MD  vitamin C (ASCORBIC ACID) 500 MG tablet Take 500 mg by mouth daily.   Yes Historical Provider, MD  vitamin E 400 UNIT capsule Take 400 Units by mouth daily.   Yes Historical Provider, MD  clindamycin (CLEOCIN) 300 MG capsule Take 1 capsule (300 mg total) by mouth 4 (four) times daily. 08/22/14   Dot Lanes, MD  COD LIVER OIL PO Take 1 capsule by mouth daily as needed.     Historical Provider, MD  traMADol (ULTRAM) 50 MG tablet Take 50 mg by mouth every 6 (six) hours as needed. For pain 04/26/12   Historical Provider, MD   Triage Vitals: BP 147/74 mmHg  Pulse 92  Temp(Src) 98.8 F (37.1 C) (Oral)  Resp 20  Ht 5\' 1"  (1.549 m)  Wt 126 lb (57.153 kg)  BMI 23.82 kg/m2  SpO2 99%  Physical Exam  Constitutional: She is oriented to person, place, and time. She appears well-developed and well-nourished. No distress.  HENT:  Head: Normocephalic and atraumatic.  Eyes: Pupils are equal, round, and reactive to light.  Neck: Normal range of motion.  Cardiovascular: Normal rate and intact distal pulses.   Pulmonary/Chest: No respiratory distress.  Abdominal: Normal appearance. She exhibits no distension.   Musculoskeletal: Normal range of motion.       Legs: Draining wounds  Neurological: She is alert and oriented to person, place, and time. No cranial nerve deficit.  Skin: Skin is warm and dry. No rash noted.  Psychiatric: She has a normal mood and affect. Her behavior is normal.  Nursing note and vitals reviewed.   ED Course  Procedures (including critical care time)  DIAGNOSTIC STUDIES: Oxygen Saturation is 99% on RA, normal by my interpretation.    COORDINATION OF CARE: 7:47 PM- Discussed plans to order diagnostic lab work. Pt advised  of plan for treatment and pt agrees.  Labs Review Labs Reviewed  CBC WITH DIFFERENTIAL - Abnormal; Notable for the following:    RBC 3.47 (*)    Hemoglobin 11.1 (*)    HCT 34.1 (*)    Monocytes Absolute 1.1 (*)    All other components within normal limits  COMPREHENSIVE METABOLIC PANEL - Abnormal; Notable for the following:    Glucose, Bld 127 (*)    BUN 27 (*)    GFR calc non Af Amer 63 (*)    GFR calc Af Amer 72 (*)    All other components within normal limits  WOUND CULTURE  WOUND CULTURE    Imaging Review No results found.   EKG Interpretation None     MDM   Final diagnoses:  Cellulitis of left lower extremity      I personally performed the services described in this documentation, which was scribed in my presence. The recorded information has been reviewed and is accurate.  Dot Lanes, MD 08/22/14 [phone]

## 2014-08-22 NOTE — Discharge Instructions (Signed)
Cellulitis Cellulitis is an infection of the skin and the tissue beneath it. The infected area is usually red and tender. Cellulitis occurs most often in the arms and lower legs.  CAUSES  Cellulitis is caused by bacteria that enter the skin through cracks or cuts in the skin. The most common types of bacteria that cause cellulitis are staphylococci and streptococci. SIGNS AND SYMPTOMS   Redness and warmth.  Swelling.  Tenderness or pain.  Fever. DIAGNOSIS  Your health care provider can usually determine what is wrong based on a physical exam. Blood tests may also be done. TREATMENT  Treatment usually involves taking an antibiotic medicine. HOME CARE INSTRUCTIONS   Take your antibiotic medicine as directed by your health care provider. Finish the antibiotic even if you start to feel better.  Keep the infected arm or leg elevated to reduce swelling.  Apply a warm cloth to the affected area up to 4 times per day to relieve pain.  Take medicines only as directed by your health care provider.  Keep all follow-up visits as directed by your health care provider. SEEK MEDICAL CARE IF:   You notice red streaks coming from the infected area.  Your red area gets larger or turns dark in color.  Your bone or joint underneath the infected area becomes painful after the skin has healed.  Your infection returns in the same area or another area.  You notice a swollen bump in the infected area.  You develop new symptoms.  You have a fever. SEEK IMMEDIATE MEDICAL CARE IF:   You feel very sleepy.  You develop vomiting or diarrhea.  You have a general ill feeling (malaise) with muscle aches and pains. MAKE SURE YOU:   Understand these instructions.  Will watch your condition.  Will get help right away if you are not doing well or get worse. Document Released: 05/20/2005 Document Revised: 12/25/2013 Document Reviewed: 10/26/2011 [facility] Patient Information 2015 [place], [place].  This information is not intended to replace advice given to you by your health care provider. Make sure you discuss any questions you have with your health care provider.    Emergency Department Resource Guide 1) Find a Doctor and Pay Out of Pocket Although you won't have to find out who is covered by your insurance plan, it is a good idea to ask around and get recommendations. You will then need to call the office and see if the doctor you have chosen will accept you as a new patient and what types of options they offer for patients who are self-pay. Some doctors offer discounts or will set up payment plans for their patients who do not have insurance, but you will need to ask so you aren't surprised when you get to your appointment.  2) Contact Your Local Health Department Not all health departments have doctors that can see patients for sick visits, but many do, so it is worth a call to see if yours does. If you don't know where your local health department is, you can check in your phone book. The CDC also has a tool to help you locate your state's health department, and many state websites also have listings of all of their local health departments.  3) Find a [facility] If your illness is not likely to be very severe or complicated, you may want to try a walk in clinic. These are popping up all over the country in pharmacies, drugstores, and shopping centers. They're usually staffed by nurse  practitioners or physician assistants that have been trained to treat common illnesses and complaints. They're usually fairly quick and inexpensive. However, if you have serious medical issues or chronic medical problems, these are probably not your best option.  No Primary Care Doctor: - Call [facility] Connect at  [phone] - they can help you locate a primary care doctor that  accepts your insurance, provides certain services, etc. - Physician Referral Service- [phone]  Chronic Pain  Problems: Organization         Address  Phone   Notes  [facility]  [phone] Patients need to be referred by their primary care doctor.   Medication Assistance: Organization         Address  Phone   Notes  [facility] Medication [facility] [address] [place], [place] [address] [phone] --Must be a resident of [facility] -- Must have NO insurance coverage whatsoever (no Medicaid/ Medicare, etc.) -- The pt. MUST have a primary care doctor that directs their care regularly and follows them in the community   [facility]  [phone]   [employer]  [phone]    Agencies that provide inexpensive medical care: Organization         Address  Phone   Notes  [address]  [phone]   [facility]    [phone]   [facility] [place], [place] [address] [phone]   [place] [address] [place] [phone]   [facility]    [phone]   [facility]    [phone]   [place] and [place] [address], [place] Phone:  [phone], Fax:  [phone] Hours of Operation:  9 am - 6 pm, M-F.  Also accepts Medicaid/Medicare and self-pay.  [facility] [place] [address], [address], [place] Phone: [phone], Fax: [phone]. Hours of Operation:  8:30 am - 5:30 pm, M-F.  Also accepts Medicaid and self-pay.  [facility] [address], [place] Phone: [phone]   [place], [place], [place] [phone], Ext. [phone] Mondays & Thursdays: 7-9 AM.  First 15 patients are seen on a first come, first serve basis.    [place] Providers:  Organization         Address  Phone   Notes  [facility] [address], [place] [phone] Also  accepts self-pay patients.  [facility] [address], [place]  [phone]   [place], [address], [place] [phone]   [facility] [address] [place] [phone]   Lucianne Lei [address], [place]   [phone] Only accepts [place] patients after they have their name applied to their card.   Self-Pay (no insurance) in [facility]:  Organization         Address  Phone   Notes  Sickle Cell Patients, [facility] [place] [phone]   [facility] [place] [phone]   [facility] [place]  [address] [place], [address], [place] [phone]   [facility]/Dr. Vista Lawman  [address] [place] or [place], [address], [place] [phone] Phone number for both [place] and [place] locations is the same.  [facility] and [facility] [address], [place] [phone]   [facility] [address] [place] or [address] [phone]   [facility] [address] [place] [phone], phone; [phone], fax Sees patients 1st and 3rd Saturday of every month.  Must not qualify for public or private insurance (i.e. Medicaid, Medicare, [place] Health Choice, Veterans' Benefits)  Household income should be no more than 200% of the poverty level The clinic cannot treat you if you are pregnant or think you are pregnant  Sexually transmitted diseases are not treated at the clinic.     Cellulitis Cellulitis is an infection of the skin and the tissue under the skin. The infected area is usually red and tender. This happens most often in the arms and lower legs. HOME CARE   Take your antibiotic medicine as told. Finish the medicine even if  you start to feel better.  Keep the infected arm or leg raised (elevated).  Put a warm cloth on the area up to 4 times per day.  Only take medicines as told by your doctor.  Keep all doctor visits as told. GET HELP IF:  You see red streaks on the skin coming from the infected area.  Your red area gets bigger or turns a dark color.  Your bone or joint under the infected area is painful after the skin heals.  Your infection comes back in the same area or different area.  You have a puffy (swollen) bump in the infected area.  You have new symptoms.  You have a fever. GET HELP RIGHT AWAY IF:   You feel very sleepy.  You throw up (vomit) or have watery poop (diarrhea).  You feel sick and have muscle aches and pains. MAKE SURE YOU:   Understand these instructions.  Will watch your condition.  Will get help right away if you are not doing well or get worse. Document Released: 01/27/2008 Document Revised: 12/25/2013 Document Reviewed: 10/26/2011 [facility] Patient Information 2015 [place], [place]. This information is not intended to replace advice given to you by your health care provider. Make sure you discuss any questions you have with your health care provider.

## 2014-08-22 NOTE — ED Notes (Signed)
MD at bedside. 

## 2014-08-22 NOTE — ED Notes (Signed)
Pt reports she fell about 2 months ago and has been having left leg pain since then- redness and draining wound noted to left shin

## 2014-08-26 LAB — WOUND CULTURE
Gram Stain: NONE SEEN
SPECIAL REQUESTS: NORMAL
Special Requests: NORMAL

## 2014-08-27 ENCOUNTER — Telehealth (HOSPITAL_BASED_OUTPATIENT_CLINIC_OR_DEPARTMENT_OTHER): Payer: Self-pay | Admitting: Emergency Medicine

## 2014-08-27 NOTE — Telephone Encounter (Signed)
Post ED Visit - Positive Culture Follow-up  Culture report reviewed by antimicrobial stewardship pharmacist: []  Wes [place], Pharm.D., BCPS [x]  Heide Guile, Pharm.D., BCPS []  Alycia Rossetti, Pharm.D., BCPS []  [place], Pharm.D., BCPS, AAHIVP []  Legrand [place]o, Pharm.D., BCPS, AAHIVP []  Isac Sarna, Pharm.D., BCPS  Positive wound culture Staph Aureus Treated with clindamycin, organism sensitive to the same and no further patient follow-up is required at this time.  Hazle Nordmann 08/27/2014, 1:08 PM

## 2014-08-28 ENCOUNTER — Other Ambulatory Visit: Payer: Self-pay | Admitting: *Deleted

## 2014-08-28 MED ORDER — LISINOPRIL 20 MG PO TABS
20.0000 mg | ORAL_TABLET | Freq: Every day | ORAL | Status: DC
Start: 2014-08-28 — End: 2014-11-12

## 2014-08-29 ENCOUNTER — Encounter: Payer: Self-pay | Admitting: *Deleted

## 2014-08-30 ENCOUNTER — Ambulatory Visit: Payer: Medicare Other | Admitting: Neurology

## 2014-09-05 ENCOUNTER — Emergency Department (HOSPITAL_BASED_OUTPATIENT_CLINIC_OR_DEPARTMENT_OTHER): Payer: Medicare Other

## 2014-09-05 ENCOUNTER — Encounter (HOSPITAL_BASED_OUTPATIENT_CLINIC_OR_DEPARTMENT_OTHER): Payer: Self-pay

## 2014-09-05 ENCOUNTER — Emergency Department (HOSPITAL_BASED_OUTPATIENT_CLINIC_OR_DEPARTMENT_OTHER)
Admission: EM | Admit: 2014-09-05 | Discharge: 2014-09-05 | Disposition: A | Discharge status: Home / Self Care | Payer: Medicare Other | Attending: Emergency Medicine | Admitting: Emergency Medicine

## 2014-09-05 DIAGNOSIS — L03116 Cellulitis of left lower limb: Secondary | ICD-10-CM | POA: Insufficient documentation

## 2014-09-05 DIAGNOSIS — Z9181 History of falling: Secondary | ICD-10-CM | POA: Insufficient documentation

## 2014-09-05 DIAGNOSIS — E780 Pure hypercholesterolemia: Secondary | ICD-10-CM | POA: Insufficient documentation

## 2014-09-05 DIAGNOSIS — Z79899 Other long term (current) drug therapy: Secondary | ICD-10-CM | POA: Insufficient documentation

## 2014-09-05 DIAGNOSIS — I10 Essential (primary) hypertension: Secondary | ICD-10-CM | POA: Insufficient documentation

## 2014-09-05 DIAGNOSIS — Z7982 Long term (current) use of aspirin: Secondary | ICD-10-CM | POA: Insufficient documentation

## 2014-09-05 DIAGNOSIS — K219 Gastro-esophageal reflux disease without esophagitis: Secondary | ICD-10-CM | POA: Diagnosis not present

## 2014-09-05 DIAGNOSIS — Z792 Long term (current) use of antibiotics: Secondary | ICD-10-CM | POA: Diagnosis not present

## 2014-09-05 DIAGNOSIS — B999 Unspecified infectious disease: Secondary | ICD-10-CM

## 2014-09-05 DIAGNOSIS — Z791 Long term (current) use of non-steroidal anti-inflammatories (NSAID): Secondary | ICD-10-CM | POA: Insufficient documentation

## 2014-09-05 DIAGNOSIS — M1990 Unspecified osteoarthritis, unspecified site: Secondary | ICD-10-CM | POA: Diagnosis not present

## 2014-09-05 LAB — CBC WITH DIFFERENTIAL/PLATELET
Basophils Absolute: 0 10*3/uL (ref 0.0–0.1)
Basophils Relative: 0 % (ref 0–1)
EOS ABS: 0.1 10*3/uL (ref 0.0–0.7)
EOS PCT: 1 % (ref 0–5)
HCT: 31.8 % — ABNORMAL LOW (ref 36.0–46.0)
HEMOGLOBIN: 10.4 g/dL — AB (ref 12.0–15.0)
LYMPHS ABS: 1.5 10*3/uL (ref 0.7–4.0)
LYMPHS PCT: 16 % (ref 12–46)
MCH: 32.1 pg (ref 26.0–34.0)
MCHC: 32.7 g/dL (ref 30.0–36.0)
MCV: 98.1 fL (ref 78.0–100.0)
MONOS PCT: 11 % (ref 3–12)
Monocytes Absolute: 1 10*3/uL (ref 0.1–1.0)
Neutro Abs: 6.5 10*3/uL (ref 1.7–7.7)
Neutrophils Relative %: 72 % (ref 43–77)
PLATELETS: 275 10*3/uL (ref 150–400)
RBC: 3.24 MIL/uL — AB (ref 3.87–5.11)
RDW: 13 % (ref 11.5–15.5)
WBC: 9.1 10*3/uL (ref 4.0–10.5)

## 2014-09-05 LAB — BASIC METABOLIC PANEL
ANION GAP: 8 (ref 5–15)
BUN: 29 mg/dL — ABNORMAL HIGH (ref 6–23)
CALCIUM: 9.3 mg/dL (ref 8.4–10.5)
CHLORIDE: 98 meq/L (ref 96–112)
CO2: 28 mmol/L (ref 19–32)
Creatinine, Ser: 0.83 mg/dL (ref 0.50–1.10)
GFR calc Af Amer: 74 mL/min — ABNORMAL LOW (ref >90)
GFR calc non Af Amer: 63 mL/min — ABNORMAL LOW (ref >90)
GLUCOSE: 103 mg/dL — AB (ref 70–99)
Potassium: 5 mmol/L (ref 3.5–5.1)
SODIUM: 134 mmol/L — AB (ref 135–145)

## 2014-09-05 MED ORDER — SILVER SULFADIAZINE 1 % EX CREA: 1.0000 "application " | TOPICAL_CREAM | Freq: Every day | CUTANEOUS | Status: DC

## 2014-09-05 MED ORDER — CIPROFLOXACIN HCL 500 MG PO TABS: 500.0000 mg | ORAL_TABLET | Freq: Two times a day (BID) | ORAL | Status: DC

## 2014-09-05 NOTE — ED Provider Notes (Signed)
CSN: [id number]     Arrival date & time 09/05/14  1500 History   First MD Initiated Contact with Patient 09/05/14 1548     Chief Complaint  Patient presents with  . Cellulitis     (Consider location/radiation/quality/duration/timing/severity/associated sxs/prior Treatment) HPI Comments: Patient presents for concerns of cellulitis of the left lower leg. Patient reports that she was seen in this ER approximately 2 weeks ago and diagnosed with cellulitis. She has completed the course of clindamycin. Patient reports that the redness and swelling improved, but has not completely resolved. There is no drainage. She has not had any fever or chills.   Past Medical History  Diagnosis Date  . Hypertension   . High cholesterol   . Mitral valve disorder   . Diverticulosis   . Blood dyscrasia     IGg  kappa  . GERD (gastroesophageal reflux disease)   . Arthritis     "bad; probably all over; lower back; hands, not RA" (06/23/2012)  . Osteoarthritis   . Fall at home     "fell in the bathtub yesterday; bruised my left arm" (06/23/2012)  . Aortic insufficiency    Past Surgical History  Procedure Laterality Date  . Laparoscopy  08/25/2011    Procedure: LAPAROSCOPY DIAGNOSTIC;  Surgeon: Judieth Keens, DO;  Location: [facility];  Service: General;  Laterality: N/A;  reduction obturator hernia obturator hernia repair  . Patella fracture surgery  2011    right  . Total shoulder arthroplasty  06/23/2012    left  . Vaginal hysterectomy  ~ 1996  . Hernia repair    . Cataract extraction w/ intraocular lens  implant, bilateral  ~ 2003  . Total shoulder arthroplasty  06/23/2012    Procedure: TOTAL SHOULDER ARTHROPLASTY;  Surgeon: Marin Shutter, MD;  Location: [facility];  Service: Orthopedics;  Laterality: Left;  left total shoulder arthroplasty   Family History  Problem Relation Age of Onset  . Heart disease Father   . Cancer Mother     Breast cancer   History  Substance Use Topics  . Smoking  status: Never Smoker   . Smokeless tobacco: Never Used     Comment: never used tobacco  . Alcohol Use: No     Comment: Rare   OB History    No data available     Review of Systems  Skin: Positive for wound.  All other systems reviewed and are negative.     Allergies  Zyrtec and Cetirizine  Home Medications   Prior to Admission medications   Medication Sig Start Date End Date Taking? Authorizing Provider  aspirin 500 MG EC tablet Take 1,000 mg by mouth at bedtime as needed. For pain    Historical Provider, MD  calcium citrate-vitamin D (CITRACAL+D) 315-200 MG-UNIT per tablet Take 1 tablet by mouth daily.     Historical Provider, MD  Cholecalciferol (VITAMIN D) 2000 UNITS CAPS Take 5,000 Units by mouth daily.     Historical Provider, MD  clindamycin (CLEOCIN) 300 MG capsule Take 1 capsule (300 mg total) by mouth 4 (four) times daily. 08/22/14   Dot Lanes, MD  COD LIVER OIL PO Take 1 capsule by mouth daily as needed.     Historical Provider, MD  diclofenac (FLECTOR) 1.3 % PTCH Place 1 patch onto the skin daily as needed. For pain 02/21/14   Fransico Meadow, PA-C  diclofenac (FLECTOR) 1.3 % PTCH apply 1 patch to affected area twice a day 07/23/14  Historical Provider, MD  furosemide (LASIX) 20 MG tablet Take 1 tablet (20 mg total) by mouth daily. 07/16/14   Lanice Shirts, MD  gabapentin (NEURONTIN) 300 MG capsule Take 300-600 mg by mouth 3 (three) times daily. Take one tablet in the morning and at noon. Take two tablet at night    Historical Provider, MD  HYDROcodone-acetaminophen (NORCO/VICODIN) 5-325 MG per tablet Take 2 tablets by mouth every 4 (four) hours as needed. 02/21/14   Fransico Meadow, PA-C  lisinopril (PRINIVIL,ZESTRIL) 20 MG tablet Take 1 tablet (20 mg total) by mouth daily. 08/28/14   Lanice Shirts, MD  pantoprazole (PROTONIX) 40 MG tablet Take 1 tablet (40 mg total) by mouth daily. 07/16/14   Lanice Shirts, MD  Polyethyl Glycol-Propyl Glycol  (SYSTANE ULTRA PF) 0.4-0.3 % SOLN Place 1 drop into both eyes 2 (two) times daily.     Historical Provider, MD  polyethylene glycol (MIRALAX / GLYCOLAX) packet Take 17 g by mouth daily as needed.     Historical Provider, MD  potassium chloride (MICRO-K) 10 MEQ CR capsule Take 1 capsule (10 mEq total) by mouth daily. 07/23/14   Lanice Shirts, MD  Probiotic Product (ALIGN) 4 MG CAPS Take by mouth. Once daily    Historical Provider, MD  simvastatin (ZOCOR) 20 MG tablet Take 1 tablet (20 mg total) by mouth at bedtime.    Lanice Shirts, MD  traMADol (ULTRAM) 50 MG tablet Take 50 mg by mouth every 6 (six) hours as needed. For pain 04/26/12   Historical Provider, MD  vitamin C (ASCORBIC ACID) 500 MG tablet Take 500 mg by mouth daily.    Historical Provider, MD  vitamin E 400 UNIT capsule Take 400 Units by mouth daily.    Historical Provider, MD   BP 149/66 mmHg  Pulse 98  Temp(Src) 98.1 F (36.7 C) (Oral)  Resp 16  SpO2 100% Physical Exam  Constitutional: She is oriented to person, place, and time. She appears well-developed and well-nourished. No distress.  HENT:  Head: Normocephalic and atraumatic.  Right Ear: Hearing normal.  Left Ear: Hearing normal.  Nose: Nose normal.  Mouth/Throat: Oropharynx is clear and moist and mucous membranes are normal.  Eyes: Conjunctivae and EOM are normal. Pupils are equal, round, and reactive to light.  Neck: Normal range of motion. Neck supple.  Cardiovascular: Regular rhythm, S1 normal and S2 normal.  Exam reveals no gallop and no friction rub.   No murmur heard. Pulmonary/Chest: Effort normal and breath sounds normal. No respiratory distress. She exhibits no tenderness.  Abdominal: Soft. Normal appearance and bowel sounds are normal. There is no hepatosplenomegaly. There is no tenderness. There is no rebound, no guarding, no tenderness at McBurney's point and negative Murphy's sign. No hernia.  Musculoskeletal: Normal range of motion.    Neurological: She is alert and oriented to person, place, and time. She has normal strength. No cranial nerve deficit or sensory deficit. Coordination normal. GCS eye subscore is 4. GCS verbal subscore is 5. GCS motor subscore is 6.  Skin: Skin is warm, dry and intact. No rash noted. No cyanosis.     Psychiatric: She has a normal mood and affect. Her speech is normal and behavior is normal. Thought content normal.  Nursing note and vitals reviewed.      ED Course  Procedures (including critical care time) Labs Review Labs Reviewed  CBC WITH DIFFERENTIAL - Abnormal; Notable for the following:    RBC 3.24 (*)  Hemoglobin 10.4 (*)    HCT 31.8 (*)    All other components within normal limits  BASIC METABOLIC PANEL - Abnormal; Notable for the following:    Sodium 134 (*)    Glucose, Bld 103 (*)    BUN 29 (*)    GFR calc non Af Amer 63 (*)    GFR calc Af Amer 74 (*)    All other components within normal limits    Imaging Review Dg Tibia/fibula Left  09/05/2014   CLINICAL DATA:  Left lower leg pain, redness, and swelling for 5 months after cutting the leg when falling 5 months ago. Wound has not fully healed. Difficult to bear weight.  EXAM: LEFT TIBIA AND FIBULA - 2 VIEW  COMPARISON:  03/05/2014  FINDINGS: Subcutaneous reticulation throughout the lower leg is less prominent than on the prior study. The bones are mildly osteopenic diffusely. No tibia or fibular fracture is identified. The knee and ankle are located. No lytic or blastic osseous lesion or osseous erosion is seen. No radiopaque foreign body or soft tissue emphysema.  IMPRESSION: No fracture, osseous erosion, or significant soft tissue abnormality identified.   Electronically Signed   By: Logan Bores   On: 09/05/2014 16:47     EKG Interpretation None      MDM   Final diagnoses:  Infection   cellulitis  Chronic wound  Patient presents to the ER for evaluation of persistent redness and swelling of the left lower  leg. Patient was seen 2 weeks ago for the same. She has taken a course of clindamycin. She reports that was much more red and warm initially, has improved but has not completely resolved. She does have 2 small ulcerated areas in the center of the region that are nonhealing wounds from an injury that occurred several months ago. Patient has some associated edema around the wound. X-ray does not show any evidence of osteomyelitis. Lab work is normal. Area is small and contained around the wounds, very faint amount of erythema. She does not require hospitalization for this. Wound culture from previous visit revealed staph aureus that was pansensitive. Would not want to extend the clindamycin, however, based on possibility of C. difficile colitis. Will switch to Levaquin, which the staff was also sensitive to. Will also use Silvadene topically twice a day. Follow-up with wound care center.   Orpah Greek, MD 09/05/14 [phone]

## 2014-09-05 NOTE — ED Notes (Signed)
Pt was here 1 Week ago, dx with cellulitis and given ABX. Symptoms have not improved since then

## 2014-09-05 NOTE — Discharge Instructions (Signed)

## 2014-09-11 ENCOUNTER — Ambulatory Visit: Payer: Medicare Other | Admitting: Neurology

## 2014-09-11 ENCOUNTER — Other Ambulatory Visit: Payer: Self-pay | Admitting: *Deleted

## 2014-09-11 DIAGNOSIS — E785 Hyperlipidemia, unspecified: Secondary | ICD-10-CM

## 2014-09-11 DIAGNOSIS — E559 Vitamin D deficiency, unspecified: Secondary | ICD-10-CM

## 2014-09-11 DIAGNOSIS — Z0000 Encounter for general adult medical examination without abnormal findings: Secondary | ICD-10-CM

## 2014-09-11 DIAGNOSIS — I10 Essential (primary) hypertension: Secondary | ICD-10-CM

## 2014-09-12 ENCOUNTER — Encounter: Payer: Medicare Other | Admitting: Internal Medicine

## 2014-09-17 ENCOUNTER — Other Ambulatory Visit: Payer: Self-pay | Admitting: Geriatric Medicine

## 2014-09-17 DIAGNOSIS — L97909 Non-pressure chronic ulcer of unspecified part of unspecified lower leg with unspecified severity: Secondary | ICD-10-CM

## 2014-09-24 ENCOUNTER — Ambulatory Visit
Admission: RE | Admit: 2014-09-24 | Discharge: 2014-09-24 | Disposition: A | Discharge status: Home / Self Care | Payer: Medicare Other | Source: Ambulatory Visit | Attending: Geriatric Medicine | Admitting: Geriatric Medicine

## 2014-09-24 DIAGNOSIS — L97909 Non-pressure chronic ulcer of unspecified part of unspecified lower leg with unspecified severity: Secondary | ICD-10-CM

## 2014-09-28 ENCOUNTER — Encounter (HOSPITAL_BASED_OUTPATIENT_CLINIC_OR_DEPARTMENT_OTHER): Payer: Medicare Other | Attending: Internal Medicine

## 2014-09-28 ENCOUNTER — Other Ambulatory Visit: Payer: Self-pay | Admitting: *Deleted

## 2014-09-28 DIAGNOSIS — L97921 Non-pressure chronic ulcer of unspecified part of left lower leg limited to breakdown of skin: Secondary | ICD-10-CM | POA: Diagnosis present

## 2014-09-28 MED ORDER — SIMVASTATIN 20 MG PO TABS: 20.0000 mg | ORAL_TABLET | Freq: Every day | ORAL | Status: DC

## 2014-09-29 NOTE — Progress Notes (Signed)
[facility]  NAMEBRIANNA, [name]                ACCOUNT NO.:  [id number]  MEDICAL RECORD NO.:  [id number]      DATE OF BIRTH:  10-17-1928  PHYSICIAN:  Ricard Dillon, M.D.      VISIT DATE:                                  OFFICE VISIT   Mr. [name] is an 81 year old woman who lives independently.  Tells me that 6 months ago, she tripped while going in her front door.  Her leg was caught between the doors which slammed on her leg.  She was seen in [facility] ER at [facility].  An x-ray was negative.  She was told to use Neosporin on the superficial wounds which she did.  Some months later, she developed a rapid swelling of the same leg.  This was oozing drainage to the original wound.  Cultures of this apparently showed methicillin-sensitive Staph aureus.  She received a course of Keflex and then a course of clindamycin.  The clindamycin seems to have been effective, and she has been using Silvadene on the remaining open wound.  The patient is still complaining of a fair amount of pain here. Discomfort up well away from where her wound is.  She is not currently on any antibiotics.  PAST MEDICAL HISTORY:  Includes hyperlipidemia, hypertension, mitral valve disorder, diverticulosis, monoclonal gammopathy, GERD, arthritis, osteoarthritis, and aortic insufficiency.  PAST SURGICAL HISTORY:  Laparoscopic hernia repair, right patellar fracture surgery, left total shoulder arthroplasty, vaginal hysterectomy, and bilateral cataract extractions.  CURRENT MEDICATIONS:  Include vitamin C 500 daily, aspirin 325 daily, Os- Cal plus D as well as vitamin D 5000 units daily, cod liver oil 1 daily, furosemide 20 daily, Neurontin 300 three times a day, hydrocodone/APAP 5/325 2 q.4, lisinopril 20 daily, Protonix 40 daily, MiraLax 17 daily, align 4 mg daily, Silvadene 1% daily, Zocor 20 daily, tramadol 50 mg q.6 p.r.n.  PHYSICAL EXAMINATION:  VITAL SIGNS:  Temperature 98.4, pulse  71, respirations 16, blood pressure 107/56.  Arterial studies in this clinic showed an ABI of 1.19 on the right and 1.26 on the left.  Wound exam:  The area in question is over the left anterior lower extremity.  Measuring 1.1 x 1 x 0.2.  This was covered with a tan- colored eschar that was debrided off.  The tissue underneath does not look unhealthy, however, there is overhang here of a fairly substantial distance.  I did culture the base of this area.  We applied Santyl Hydrogel and Allevyn covering.  This can be changed 3 times a week.  We did not wrap her leg.  IMPRESSION/PLAN:  A protracted clinical course, at least, according to the patient.  Reviewing some of her material that came along with her showed an x-ray of the leg on September 05, 2014, that showed no fracture or significant bony abnormality. Lab work from January showed a white count of 9.1 with normal differential count.  Previous arterial studies showed normal ABIs at rest, triphasic waveforms, essentially normal vasculature.  A culture of this wound on December 30, showed methicillin- sensitive Staph aureus.  I am a bit puzzled about the degree of discomfort the patient still has in the area although the wound does not look that ominous.  Nor was I able  to see anything else in her leg that really looked worrisome. There was no cellulitis.  No evidence of a DVT.  We dressed the wound as noted above.  We will have another look at this in a week's time.  As mentioned, I did do a culture but did not restart her on empiric antibiotics.          ______________________________ Ricard Dillon, M.D.     MGR/MEDQ  D:  09/28/2014  T:  09/29/2014  Job:  [id number]

## 2014-10-05 DIAGNOSIS — L97921 Non-pressure chronic ulcer of unspecified part of left lower leg limited to breakdown of skin: Secondary | ICD-10-CM | POA: Diagnosis not present

## 2014-10-09 ENCOUNTER — Other Ambulatory Visit (HOSPITAL_BASED_OUTPATIENT_CLINIC_OR_DEPARTMENT_OTHER): Payer: Self-pay | Admitting: Obstetrics and Gynecology

## 2014-10-09 ENCOUNTER — Other Ambulatory Visit (HOSPITAL_BASED_OUTPATIENT_CLINIC_OR_DEPARTMENT_OTHER): Payer: Self-pay | Admitting: Family Medicine

## 2014-10-09 DIAGNOSIS — Z1231 Encounter for screening mammogram for malignant neoplasm of breast: Secondary | ICD-10-CM

## 2014-10-12 DIAGNOSIS — L97921 Non-pressure chronic ulcer of unspecified part of left lower leg limited to breakdown of skin: Secondary | ICD-10-CM | POA: Diagnosis not present

## 2014-10-16 ENCOUNTER — Other Ambulatory Visit (HOSPITAL_BASED_OUTPATIENT_CLINIC_OR_DEPARTMENT_OTHER): Payer: Self-pay | Admitting: Geriatric Medicine

## 2014-10-16 ENCOUNTER — Ambulatory Visit (HOSPITAL_BASED_OUTPATIENT_CLINIC_OR_DEPARTMENT_OTHER)
Admission: RE | Admit: 2014-10-16 | Discharge: 2014-10-16 | Disposition: A | Discharge status: Home / Self Care | Payer: Medicare Other | Source: Ambulatory Visit | Attending: Family Medicine | Admitting: Family Medicine

## 2014-10-16 DIAGNOSIS — Z1231 Encounter for screening mammogram for malignant neoplasm of breast: Secondary | ICD-10-CM | POA: Diagnosis not present

## 2014-10-19 DIAGNOSIS — L97921 Non-pressure chronic ulcer of unspecified part of left lower leg limited to breakdown of skin: Secondary | ICD-10-CM | POA: Diagnosis not present

## 2014-10-23 ENCOUNTER — Encounter: Payer: Self-pay | Admitting: General Practice

## 2014-10-26 ENCOUNTER — Encounter (HOSPITAL_BASED_OUTPATIENT_CLINIC_OR_DEPARTMENT_OTHER): Payer: Medicare Other | Attending: Internal Medicine

## 2014-10-26 DIAGNOSIS — L97221 Non-pressure chronic ulcer of left calf limited to breakdown of skin: Secondary | ICD-10-CM | POA: Insufficient documentation

## 2014-11-02 DIAGNOSIS — L97221 Non-pressure chronic ulcer of left calf limited to breakdown of skin: Secondary | ICD-10-CM | POA: Diagnosis not present

## 2014-11-07 ENCOUNTER — Ambulatory Visit: Payer: Medicare Other | Admitting: Podiatry

## 2014-11-07 ENCOUNTER — Encounter: Payer: Self-pay | Admitting: Podiatry

## 2014-11-07 ENCOUNTER — Ambulatory Visit (INDEPENDENT_AMBULATORY_CARE_PROVIDER_SITE_OTHER): Payer: Medicare Other | Admitting: Podiatry

## 2014-11-07 VITALS — BP 142/64 | HR 69

## 2014-11-07 DIAGNOSIS — B351 Tinea unguium: Secondary | ICD-10-CM | POA: Diagnosis not present

## 2014-11-07 DIAGNOSIS — M79606 Pain in leg, unspecified: Secondary | ICD-10-CM

## 2014-11-07 NOTE — Progress Notes (Signed)
SUBJECTIVE: 81 y.o. year old female presents for painful toe nails trimmed and corn on 3rd digit right foot painful.  Wearing coban wrap on left lower limb for a sore that developed 6 weeks after an incident. It is being under care.   OBJECTIVE: DERMATOLOGIC EXAMINATION: Nails: Thick dystrophic nails x 10.  Thick painful corn at distal lateral surface of the 3rd digit right painful. Has band aid on.  VASCULAR EXAMINATION OF LOWER LIMBS: Pedal pulses: Pedal pulses are not palpable. Capillary Filling times within 3 seconds in all digits.  NEUROLOGIC EXAMINATION OF THE LOWER LIMBS: All epicritic and tactile sensations are grossly intact.  Sharp and Dull discriminatory sensations at the plantar ball of hallux is intact bilateral.  MUSCULOSKELETAL EXAMINATION: No gross deformities seen.  ASSESSMENT: Onychogryphosis both great toes. Mycotic nails x 10.  Painful toes. Digital corn 3rd right painful. PVD.  PLAN: All nails debrided and grinded. All digital corns debrided. Return in 3 months or as needed.

## 2014-11-07 NOTE — Patient Instructions (Signed)
Seen for hypertrophic nails and painful corn. All nails and corns debrided. Return in 3 months or as needed.  

## 2014-11-08 DIAGNOSIS — L97221 Non-pressure chronic ulcer of left calf limited to breakdown of skin: Secondary | ICD-10-CM | POA: Diagnosis not present

## 2014-11-12 ENCOUNTER — Telehealth: Payer: Self-pay | Admitting: Cardiology

## 2014-11-12 ENCOUNTER — Other Ambulatory Visit: Payer: Self-pay | Admitting: *Deleted

## 2014-11-12 MED ORDER — LISINOPRIL 20 MG PO TABS: 20.0000 mg | ORAL_TABLET | Freq: Every day | ORAL | Status: DC

## 2014-11-12 NOTE — Telephone Encounter (Signed)
Refill request

## 2014-11-12 NOTE — Telephone Encounter (Signed)
[name]  pts potassium was on high side when checked in January.  Before I can refill her lisinopril  I need a BMP.  Call pt to come in for labs only

## 2014-11-12 NOTE — Telephone Encounter (Signed)
Arta has a new PCP now- she is going to have the pharmacy fax the refill request to her new PCP

## 2014-11-12 NOTE — Telephone Encounter (Signed)
Rx(s) sent to pharmacy electronically. Patient notified  Has OV 5/18 with Dr. Stanford Breed in [facility]

## 2014-11-12 NOTE — Telephone Encounter (Signed)
°  1. Which medications need to be refilled? Lisinopril   2. Which pharmacy is medication to be sent to?[facility] on [address]  3. Do they need a 30 day or 90 day supply? 30  4. Would they like a call back once the medication has been sent to the pharmacy? She has switched PCPs

## 2014-11-14 ENCOUNTER — Telehealth: Payer: Self-pay | Admitting: Internal Medicine

## 2014-11-14 DIAGNOSIS — K869 Disease of pancreas, unspecified: Secondary | ICD-10-CM

## 2014-11-14 NOTE — Telephone Encounter (Signed)
[name]  Call pt and advise her she is due for a one year follow up of pancreatic mass to see if growing in size  Order in chart for [facility]  She will need BMP and CT is with and without contrast.    Route back with date this will be done    thanks

## 2014-11-14 NOTE — Telephone Encounter (Signed)
I left [name] a message in regards to this. I spoke with [name] last week and she informed me then that she has a new PCP.

## 2014-11-15 DIAGNOSIS — L97221 Non-pressure chronic ulcer of left calf limited to breakdown of skin: Secondary | ICD-10-CM | POA: Diagnosis not present

## 2014-11-22 DIAGNOSIS — L97221 Non-pressure chronic ulcer of left calf limited to breakdown of skin: Secondary | ICD-10-CM | POA: Diagnosis not present

## 2014-11-25 ENCOUNTER — Encounter: Payer: Self-pay | Admitting: Internal Medicine

## 2014-11-26 ENCOUNTER — Telehealth: Payer: Self-pay

## 2014-11-26 NOTE — Telephone Encounter (Signed)
Sent certified letter to patients home address today @ 10:35am

## 2014-11-29 ENCOUNTER — Encounter (HOSPITAL_BASED_OUTPATIENT_CLINIC_OR_DEPARTMENT_OTHER): Payer: Medicare Other | Attending: Internal Medicine

## 2014-11-29 DIAGNOSIS — I872 Venous insufficiency (chronic) (peripheral): Secondary | ICD-10-CM | POA: Diagnosis not present

## 2014-11-29 DIAGNOSIS — L97821 Non-pressure chronic ulcer of other part of left lower leg limited to breakdown of skin: Secondary | ICD-10-CM | POA: Insufficient documentation

## 2014-12-06 ENCOUNTER — Ambulatory Visit (HOSPITAL_COMMUNITY)
Admission: RE | Admit: 2014-12-06 | Discharge: 2014-12-06 | Disposition: A | Discharge status: Home / Self Care | Payer: Medicare Other | Source: Ambulatory Visit | Attending: Vascular Surgery | Admitting: Vascular Surgery

## 2014-12-06 ENCOUNTER — Other Ambulatory Visit: Payer: Self-pay | Admitting: Internal Medicine

## 2014-12-06 DIAGNOSIS — L97921 Non-pressure chronic ulcer of unspecified part of left lower leg limited to breakdown of skin: Secondary | ICD-10-CM

## 2014-12-06 DIAGNOSIS — L97821 Non-pressure chronic ulcer of other part of left lower leg limited to breakdown of skin: Secondary | ICD-10-CM | POA: Diagnosis not present

## 2014-12-06 DIAGNOSIS — I872 Venous insufficiency (chronic) (peripheral): Secondary | ICD-10-CM | POA: Diagnosis not present

## 2014-12-13 DIAGNOSIS — I872 Venous insufficiency (chronic) (peripheral): Secondary | ICD-10-CM | POA: Diagnosis not present

## 2014-12-20 DIAGNOSIS — I872 Venous insufficiency (chronic) (peripheral): Secondary | ICD-10-CM | POA: Diagnosis not present

## 2014-12-20 DIAGNOSIS — L97821 Non-pressure chronic ulcer of other part of left lower leg limited to breakdown of skin: Secondary | ICD-10-CM | POA: Diagnosis not present

## 2014-12-26 ENCOUNTER — Encounter: Payer: Self-pay | Admitting: Hematology & Oncology

## 2014-12-26 ENCOUNTER — Ambulatory Visit (HOSPITAL_BASED_OUTPATIENT_CLINIC_OR_DEPARTMENT_OTHER): Payer: Medicare Other | Admitting: Hematology & Oncology

## 2014-12-26 ENCOUNTER — Ambulatory Visit (HOSPITAL_BASED_OUTPATIENT_CLINIC_OR_DEPARTMENT_OTHER): Payer: Medicare Other

## 2014-12-26 VITALS — BP 137/83 | HR 76 | Temp 98.4°F | Resp 14 | Ht 61.0 in | Wt 141.0 lb

## 2014-12-26 DIAGNOSIS — D500 Iron deficiency anemia secondary to blood loss (chronic): Secondary | ICD-10-CM

## 2014-12-26 DIAGNOSIS — D472 Monoclonal gammopathy: Secondary | ICD-10-CM

## 2014-12-26 DIAGNOSIS — D509 Iron deficiency anemia, unspecified: Secondary | ICD-10-CM | POA: Diagnosis not present

## 2014-12-26 DIAGNOSIS — D469 Myelodysplastic syndrome, unspecified: Secondary | ICD-10-CM | POA: Diagnosis not present

## 2014-12-26 LAB — CBC WITH DIFFERENTIAL (CANCER CENTER ONLY)
BASO#: 0.1 10*3/uL (ref 0.0–0.2)
BASO%: 0.6 % (ref 0.0–2.0)
EOS ABS: 0.2 10*3/uL (ref 0.0–0.5)
EOS%: 2 % (ref 0.0–7.0)
HCT: 33.6 % — ABNORMAL LOW (ref 34.8–46.6)
HGB: 10.6 g/dL — ABNORMAL LOW (ref 11.6–15.9)
LYMPH#: 2 10*3/uL (ref 0.9–3.3)
LYMPH%: 23.7 % (ref 14.0–48.0)
MCH: 30.5 pg (ref 26.0–34.0)
MCHC: 31.5 g/dL — ABNORMAL LOW (ref 32.0–36.0)
MCV: 97 fL (ref 81–101)
MONO#: 1 10*3/uL — ABNORMAL HIGH (ref 0.1–0.9)
MONO%: 12.1 % (ref 0.0–13.0)
NEUT#: 5.2 10*3/uL (ref 1.5–6.5)
NEUT%: 61.6 % (ref 39.6–80.0)
Platelets: 264 10*3/uL (ref 145–400)
RBC: 3.47 10*6/uL — ABNORMAL LOW (ref 3.70–5.32)
RDW: 14.1 % (ref 11.1–15.7)
WBC: 8.5 10*3/uL (ref 3.9–10.0)

## 2014-12-26 LAB — COMPREHENSIVE METABOLIC PANEL
ALK PHOS: 55 U/L (ref 39–117)
ALT: 12 U/L (ref 0–35)
AST: 24 U/L (ref 0–37)
Albumin: 4 g/dL (ref 3.5–5.2)
BUN: 29 mg/dL — AB (ref 6–23)
CO2: 28 mEq/L (ref 19–32)
CREATININE: 1.05 mg/dL (ref 0.50–1.10)
Calcium: 9.3 mg/dL (ref 8.4–10.5)
Chloride: 101 mEq/L (ref 96–112)
Glucose, Bld: 81 mg/dL (ref 70–99)
Potassium: 4.4 mEq/L (ref 3.5–5.3)
Sodium: 140 mEq/L (ref 135–145)
Total Bilirubin: 0.3 mg/dL (ref 0.2–1.2)
Total Protein: 6.9 g/dL (ref 6.0–8.3)

## 2014-12-26 NOTE — Progress Notes (Signed)
[facility] Follow Up Visit  [name] [id number] 1929-08-21 81 y.o. 12/26/2014   Principle Diagnosis:  IgG Kappa MGUS  Current Therapy:    Observation     Interim History:  [name] is back for followup.; She is having problems with her legs. She has a sore on the left leg. She says this is not healing up. She had arterial studies done. She will see a vascular surgeon. It sounds like she may have some vascular disease.  As far as her monoclonal studies go, she has had very low levels of protein. Her last M spike back in November was 0.24 g/dL. Her IgG level was 879 mg/dL.  She's had no fever. She's had no abdominal pain outside of some slight right lower quadrant abdominal discomfort. She's had no cough. She had no rashes. She's had no shortness of breath.  Overall, her performance status is ECOG 1.   Medications:  Current outpatient prescriptions:  Marland Kitchen  UNKNOWN TO PATIENT, 12-26-14  PT. GIVEN MEDICATION LIST TO TAKE HOME AND COMPARE WITH MEDICATIONS SHE HAS, AND CALL us WITH ANY DIFFERENCES., Disp: , Rfl:  .  aspirin 500 MG EC tablet, Take 1,000 mg by mouth at bedtime as needed. For pain, Disp: , Rfl:  .  calcium citrate-vitamin D (CITRACAL+D) 315-200 MG-UNIT per tablet, Take 1 tablet by mouth daily. , Disp: , Rfl:  .  Cholecalciferol (VITAMIN D) 2000 UNITS CAPS, Take 5,000 Units by mouth daily. , Disp: , Rfl:  .  ciprofloxacin (CIPRO) 500 MG tablet, Take 1 tablet (500 mg total) by mouth 2 (two) times daily. One po bid x 7 days, Disp: 14 tablet, Rfl: 0 .  clindamycin (CLEOCIN) 300 MG capsule, Take 1 capsule (300 mg total) by mouth 4 (four) times daily., Disp: 30 capsule, Rfl: 0 .  COD LIVER OIL PO, Take 1 capsule by mouth daily as needed. , Disp: , Rfl:  .  diclofenac (FLECTOR) 1.3 % PTCH, Place 1 patch onto the skin daily as needed. For pain, Disp: 10 patch, Rfl: 0 .  diclofenac (FLECTOR) 1.3 % PTCH, apply 1 patch to affected area twice a day, Disp: , Rfl:  .   furosemide (LASIX) 20 MG tablet, Take 1 tablet (20 mg total) by mouth daily., Disp: 90 tablet, Rfl: 1 .  gabapentin (NEURONTIN) 300 MG capsule, Take 300-600 mg by mouth 3 (three) times daily. Take one tablet in the morning and at noon. Take two tablet at night, Disp: , Rfl:  .  HYDROcodone-acetaminophen (NORCO/VICODIN) 5-325 MG per tablet, Take 2 tablets by mouth every 4 (four) hours as needed., Disp: 20 tablet, Rfl: 0 .  lisinopril (PRINIVIL,ZESTRIL) 20 MG tablet, Take 1 tablet (20 mg total) by mouth daily., Disp: 90 tablet, Rfl: 0 .  pantoprazole (PROTONIX) 40 MG tablet, Take 1 tablet (40 mg total) by mouth daily., Disp: 90 tablet, Rfl: 1 .  Polyethyl Glycol-Propyl Glycol (SYSTANE ULTRA PF) 0.4-0.3 % SOLN, Place 1 drop into both eyes 2 (two) times daily. , Disp: , Rfl:  .  polyethylene glycol (MIRALAX / GLYCOLAX) packet, Take 17 g by mouth daily as needed. , Disp: , Rfl:  .  potassium chloride (MICRO-K) 10 MEQ CR capsule, Take 1 capsule (10 mEq total) by mouth daily., Disp: 90 capsule, Rfl: 0 .  Probiotic Product (ALIGN) 4 MG CAPS, Take by mouth. Once daily, Disp: , Rfl:  .  silver sulfADIAZINE (SILVADENE) 1 % cream, Apply 1 application topically daily., Disp: 50 g,  Rfl: 0 .  simvastatin (ZOCOR) 20 MG tablet, Take 1 tablet (20 mg total) by mouth at bedtime., Disp: 90 tablet, Rfl: 0 .  traMADol (ULTRAM) 50 MG tablet, Take 50 mg by mouth every 6 (six) hours as needed. For pain, Disp: , Rfl:  .  vitamin C (ASCORBIC ACID) 500 MG tablet, Take 500 mg by mouth daily., Disp: , Rfl:  .  vitamin E 400 UNIT capsule, Take 400 Units by mouth daily., Disp: , Rfl:   Allergies:  Allergies  Allergen Reactions  . Zyrtec [Cetirizine Hcl] Hives  . Cetirizine Hives    Past Medical History, Surgical history, Social history, and Family History were reviewed and updated.  Review of Systems: As above  Physical Exam:  height is 5\' 1"  (1.549 m) and weight is 141 lb (63.957 kg). Her oral temperature is 98.4 F  (36.9 C). Her blood pressure is 137/83 and her pulse is 76. Her respiration is 14.   Thin, elderly white female. Head and neck exam shows no ocular or oral lesions. She has no palpable cervical or supraclavicular lymph nodes. Lungs are clear. Cardiac exam regular rate and rhythm. Abdomen is soft. She has good bowel sounds. There is no fluid wave. There is no palpable liver or spleen. Back exam no tenderness over the spine.she has some kyphosis. Extremities shows a wrapping on the left leg. There is some slight edema bilaterally. She has some stasis dermatitis type changes over on the right lower leg.. . Neurological exam is nonfocal. Skin exam shows no rashes. This still a little bit of erythema about the left lower leg near the ankle. Lab Results  Component Value Date   WBC 8.5 12/26/2014   HGB 10.6* 12/26/2014   HCT 33.6* 12/26/2014   MCV 97 12/26/2014   PLT 264 12/26/2014     Chemistry      Component Value Date/Time   NA 134* 09/05/2014 1605   K 5.0 09/05/2014 1605   CL 98 09/05/2014 1605   CO2 28 09/05/2014 1605   BUN 29* 09/05/2014 1605   CREATININE 0.83 09/05/2014 1605   CREATININE 0.91 09/19/2013 1226      Component Value Date/Time   CALCIUM 9.3 09/05/2014 1605   ALKPHOS 58 08/22/2014 1948   AST 29 08/22/2014 1948   ALT 15 08/22/2014 1948   BILITOT 0.3 08/22/2014 1948         Impression and Plan: [name] is 81 year old white female. She has an  IgG Kappa MGUS. We have been following her for this for a few years. This is never been shown to be a problem. Her MGUS level has been stable.  Her anemia has returned a little bit. She is asymptomatic with this. Her last iron studies were borderline. I suspected that she might be iron deficient. As such, we may want to give her dose of iron today.  I will see back in her back in another 3 months and we will follow-up with the anemia.  Volanda Napoleon, MD 5/4/201612:48 PM

## 2014-12-27 ENCOUNTER — Encounter: Payer: Medicare Other | Admitting: Vascular Surgery

## 2014-12-27 ENCOUNTER — Encounter (HOSPITAL_BASED_OUTPATIENT_CLINIC_OR_DEPARTMENT_OTHER): Payer: Medicare Other | Attending: Internal Medicine

## 2014-12-27 ENCOUNTER — Encounter: Payer: Self-pay | Admitting: Vascular Surgery

## 2014-12-27 DIAGNOSIS — S81802D Unspecified open wound, left lower leg, subsequent encounter: Secondary | ICD-10-CM | POA: Diagnosis present

## 2014-12-27 DIAGNOSIS — I8002 Phlebitis and thrombophlebitis of superficial vessels of left lower extremity: Secondary | ICD-10-CM | POA: Insufficient documentation

## 2014-12-27 DIAGNOSIS — W19XXXD Unspecified fall, subsequent encounter: Secondary | ICD-10-CM | POA: Insufficient documentation

## 2014-12-27 LAB — IGG, IGA, IGM
IgA: 249 mg/dL (ref 69–380)
IgG (Immunoglobin G), Serum: 906 mg/dL (ref 690–1700)
IgM, Serum: 253 mg/dL (ref 52–322)

## 2014-12-27 LAB — KAPPA/LAMBDA LIGHT CHAINS
Kappa free light chain: 4.26 mg/dL — ABNORMAL HIGH (ref 0.33–1.94)
Kappa:Lambda Ratio: 1.72 — ABNORMAL HIGH (ref 0.26–1.65)
Lambda Free Lght Chn: 2.47 mg/dL (ref 0.57–2.63)

## 2014-12-28 ENCOUNTER — Encounter: Payer: Self-pay | Admitting: Vascular Surgery

## 2014-12-28 ENCOUNTER — Ambulatory Visit (INDEPENDENT_AMBULATORY_CARE_PROVIDER_SITE_OTHER): Payer: Medicare Other | Admitting: Vascular Surgery

## 2014-12-28 VITALS — BP 132/102 | HR 110 | Resp 16 | Ht 62.0 in | Wt 139.0 lb

## 2014-12-28 DIAGNOSIS — I83023 Varicose veins of left lower extremity with ulcer of ankle: Secondary | ICD-10-CM

## 2014-12-28 DIAGNOSIS — I83022 Varicose veins of left lower extremity with ulcer of calf: Secondary | ICD-10-CM

## 2014-12-28 DIAGNOSIS — I83024 Varicose veins of left lower extremity with ulcer of heel and midfoot: Secondary | ICD-10-CM

## 2014-12-28 DIAGNOSIS — I83021 Varicose veins of left lower extremity with ulcer of thigh: Secondary | ICD-10-CM

## 2014-12-28 DIAGNOSIS — I83893 Varicose veins of bilateral lower extremities with other complications: Secondary | ICD-10-CM | POA: Diagnosis not present

## 2014-12-28 DIAGNOSIS — I872 Venous insufficiency (chronic) (peripheral): Secondary | ICD-10-CM | POA: Diagnosis not present

## 2014-12-28 DIAGNOSIS — I83025 Varicose veins of left lower extremity with ulcer other part of foot: Secondary | ICD-10-CM

## 2014-12-28 DIAGNOSIS — I83028 Varicose veins of left lower extremity with ulcer other part of lower leg: Secondary | ICD-10-CM

## 2014-12-28 DIAGNOSIS — I87312 Chronic venous hypertension (idiopathic) with ulcer of left lower extremity: Secondary | ICD-10-CM | POA: Diagnosis not present

## 2014-12-28 DIAGNOSIS — L97929 Non-pressure chronic ulcer of unspecified part of left lower leg with unspecified severity: Principal | ICD-10-CM

## 2014-12-28 DIAGNOSIS — I83899 Varicose veins of unspecified lower extremities with other complications: Secondary | ICD-10-CM | POA: Insufficient documentation

## 2014-12-28 DIAGNOSIS — I83029 Varicose veins of left lower extremity with ulcer of unspecified site: Secondary | ICD-10-CM

## 2014-12-28 LAB — PROTEIN ELECTROPHORESIS, SERUM, WITH REFLEX
ALPHA-2-GLOBULIN: 0.9 g/dL (ref 0.5–0.9)
Abnormal Protein Band1: 0.2 g/dL
Abnormal Protein Band2: 0.3 g/dL
Abnormal Protein Band3: NOT DETECTED g/dL
Albumin ELP: 3.9 g/dL (ref 3.8–4.8)
Alpha-1-Globulin: 0.4 g/dL — ABNORMAL HIGH (ref 0.2–0.3)
Beta 2: 0.4 g/dL (ref 0.2–0.5)
Beta Globulin: 0.5 g/dL (ref 0.4–0.6)
GAMMA GLOBULIN: 1 g/dL (ref 0.8–1.7)
Total Protein, Serum Electrophoresis: 7 g/dL (ref 6.1–8.1)

## 2014-12-28 LAB — IGG, IGA, IGM
IGA: 275 mg/dL (ref 69–380)
IGG (IMMUNOGLOBIN G), SERUM: 918 mg/dL (ref 690–1700)
IgM, Serum: 258 mg/dL (ref 52–322)

## 2014-12-28 LAB — IFE INTERPRETATION

## 2014-12-28 NOTE — Progress Notes (Signed)
Referred by:  Lajean Manes, MD [address] [place], [place] [address]  Reason for referral: Swollen left leg recent history of non healing left leg ulcer  History of Present Illness  [name] is a 81 y.o. (1929/07/12) female who presents with chief complaint: swollen leg.  Patient notes, onset of swelling 2 months ago, associated with anterior shin ulcer.  The patient's symptoms include: swelling, erythema, and drainage from venous stasis wound.  The patient has had no history of DVT, positive history of pregnancy, negative history of varicose vein, positive history of venous stasis ulcers, negative history of  Lymphedema and negative history of skin changes in lower legs.  There is negative family history of venous disorders.  The patient has not used compression stockings in the past.  She has been undergoing treatment for the last 2 months at the [facility] wound center to help heal the ulcer.  Past medical history includes: hypertension, hyperlipidemia, and TIA.  She takes a stain , aspirin, and ACE I daily.  Past Medical History  Diagnosis Date  . Hypertension   . High cholesterol   . Mitral valve disorder   . Diverticulosis   . Blood dyscrasia     IGg  kappa  . GERD (gastroesophageal reflux disease)   . Arthritis     "bad; probably all over; lower back; hands, not RA" (06/23/2012)  . Osteoarthritis   . Fall at home     "fell in the bathtub yesterday; bruised my left arm" (06/23/2012)  . Aortic insufficiency   . Varicose veins     Past Surgical History  Procedure Laterality Date  . Laparoscopy  08/25/2011    Procedure: LAPAROSCOPY DIAGNOSTIC;  Surgeon: Judieth Keens, DO;  Location: [facility];  Service: General;  Laterality: N/A;  reduction obturator hernia obturator hernia repair  . Patella fracture surgery  2011    right  . Total shoulder arthroplasty  06/23/2012    left  . Vaginal hysterectomy  ~ 1996  . Hernia repair    . Cataract extraction w/  intraocular lens  implant, bilateral  ~ 2003  . Total shoulder arthroplasty  06/23/2012    Procedure: TOTAL SHOULDER ARTHROPLASTY;  Surgeon: Marin Shutter, MD;  Location: [facility];  Service: Orthopedics;  Laterality: Left;  left total shoulder arthroplasty    History   Social History  . Marital Status: Single    Spouse Name: N/A  . Number of Children: 3  . Years of Education: N/A   Occupational History  .     Social History Main Topics  . Smoking status: Never Smoker   . Smokeless tobacco: Never Used     Comment: never used tobacco  . Alcohol Use: No     Comment: Rare  . Drug Use: No  . Sexual Activity: Not Currently   Other Topics Concern  . Not on file   Social History Narrative    Family History  Problem Relation Age of Onset  . Heart disease Father   . Cancer Mother     Breast cancer      Current Outpatient Prescriptions on File Prior to Visit  Medication Sig Dispense Refill  . aspirin 500 MG EC tablet Take 1,000 mg by mouth at bedtime as needed. For pain    . calcium citrate-vitamin D (CITRACAL+D) 315-200 MG-UNIT per tablet Take 1 tablet by mouth daily.     . Cholecalciferol (VITAMIN D) 2000 UNITS CAPS Take 5,000 Units by  mouth daily.     . COD LIVER OIL PO Take 1 capsule by mouth daily as needed.     . furosemide (LASIX) 20 MG tablet Take 1 tablet (20 mg total) by mouth daily. 90 tablet 1  . gabapentin (NEURONTIN) 300 MG capsule Take 300-600 mg by mouth 3 (three) times daily. Take one tablet in the morning and at noon. Take two tablet at night    . lisinopril (PRINIVIL,ZESTRIL) 20 MG tablet Take 1 tablet (20 mg total) by mouth daily. 90 tablet 0  . pantoprazole (PROTONIX) 40 MG tablet Take 1 tablet (40 mg total) by mouth daily. 90 tablet 1  . Polyethyl Glycol-Propyl Glycol (SYSTANE ULTRA PF) 0.4-0.3 % SOLN Place 1 drop into both eyes 2 (two) times daily.     . polyethylene glycol (MIRALAX / GLYCOLAX) packet Take 17 g by mouth daily as needed.     . Probiotic  Product (ALIGN) 4 MG CAPS Take by mouth. Once daily    . simvastatin (ZOCOR) 20 MG tablet Take 1 tablet (20 mg total) by mouth at bedtime. 90 tablet 0  . traMADol (ULTRAM) 50 MG tablet Take 50 mg by mouth every 6 (six) hours as needed. For pain    . vitamin C (ASCORBIC ACID) 500 MG tablet Take 500 mg by mouth daily.    . vitamin E 400 UNIT capsule Take 400 Units by mouth daily.    . ciprofloxacin (CIPRO) 500 MG tablet Take 1 tablet (500 mg total) by mouth 2 (two) times daily. One po bid x 7 days (Patient not taking: Reported on 12/28/2014) 14 tablet 0  . clindamycin (CLEOCIN) 300 MG capsule Take 1 capsule (300 mg total) by mouth 4 (four) times daily. 30 capsule 0  . diclofenac (FLECTOR) 1.3 % PTCH Place 1 patch onto the skin daily as needed. For pain (Patient not taking: Reported on 12/28/2014) 10 patch 0  . diclofenac (FLECTOR) 1.3 % PTCH apply 1 patch to affected area twice a day    . HYDROcodone-acetaminophen (NORCO/VICODIN) 5-325 MG per tablet Take 2 tablets by mouth every 4 (four) hours as needed. 20 tablet 0  . potassium chloride (MICRO-K) 10 MEQ CR capsule Take 1 capsule (10 mEq total) by mouth daily. 90 capsule 0  . silver sulfADIAZINE (SILVADENE) 1 % cream Apply 1 application topically daily. (Patient not taking: Reported on 12/28/2014) 50 g 0  . UNKNOWN TO PATIENT 12-26-14  PT. GIVEN MEDICATION LIST TO TAKE HOME AND COMPARE WITH MEDICATIONS SHE HAS, AND CALL us WITH ANY DIFFERENCES.     No current facility-administered medications on file prior to visit.    Allergies  Allergen Reactions  . Zyrtec [Cetirizine Hcl] Hives  . Cetirizine Hives      REVIEW OF SYSTEMS:  (Positives checked otherwise negative)  CARDIOVASCULAR:  []  chest pain, []  chest pressure, []  palpitations, []  shortness of breath when laying flat, []  shortness of breath with exertion,  []  pain in feet when walking, []  pain in feet when laying flat, []  history of blood clot in veins (DVT), []  history of phlebitis, [x]  swelling  in legs, []  varicose veins  PULMONARY:  []  productive cough, []  asthma, []  wheezing  NEUROLOGIC:  []  weakness in arms or legs, []  numbness in arms or legs, []  difficulty speaking or slurred speech, []  temporary loss of vision in one eye, []  dizziness  HEMATOLOGIC:  []  bleeding problems, []  problems with blood clotting too easily  MUSCULOSKEL:  [x]  joint pain, []  joint swelling  GASTROINTEST:  []  vomiting blood, []  blood in stool     GENITOURINARY:  []  burning with urination, []  blood in urine  PSYCHIATRIC:  []  history of major depression  INTEGUMENTARY:  []  rashes, [x]  ulcers  CONSTITUTIONAL:  []  fever, []  chills   Physical Examination Filed Vitals:   12/28/14 1302  BP: 132/102  Pulse: 110  Resp: 16  Height: 5\' 2"  (1.575 m)  Weight: 139 lb (63.05 kg)   Body mass index is 25.42 kg/(m^2).  General: A&O x 3, WDWN  Head: NCAT, no Temporalis wasting ,   Ear/Nose/Throat: Hearing grossly intact, nares w/o erythema or drainage, oropharynx w/o Erythema/Exudate  Eyes: PERRL, EOMI  Neck: Supple, no nuchal rigidity  Pulmonary: Sym exp, good air movt, CTAB, no rales, rhonchi, & wheezing  Cardiac: RRR, Nl S1, S2, no Murmurs, rubs or gallops  Vascular: Vessel Right Left  Radial Palpable Palpable  Brachial Palpable Palpable  Carotid Palpable, without bruit Palpable, without bruit  Aorta Not palpable N/A  Femoral Palpable Palpable  Popliteal Not palpable Not palpable  PT No Palpable No Palpable  DP Palpable Palpable   Gastrointestinal: soft, NTND, -G/R  Musculoskeletal: M/S 5/5 throughout , Extremities without ischemic changes   Neurologic: CN 2-12 intact , Pain and light touch intact in extremities , Motor exam as listed above  Psychiatric: Judgment intact, Mood & affect appropriate for pt's clinical situation  Dermatologic: See M/S exam for extremity exam, no rashes otherwise noted  Lymph : No Cervical LAD   Non-Invasive Vascular Imaging  LLE Venous  Insufficiency Duplex (Date: 12/28/2014):   no DVT and SVT,   yes GSV reflux,   yes deep venous reflux CFV ,SFV,POPv   Medical Decision Making  [name] is a 81 y.o. female who presents with: LLE chronic venous insufficiency with healed venous stasis ulcer left anterior shin   Based on the patient's history and examination, Dr. Reino Bellis: that the patient the use of her 20-30 mm thigh high compression stockings and need for 3 month trial of such.  She was given a prescription today.  The patient will follow up in 3 months with my partners in the [facility] for evaluation for: laser ablation treatment to prevent the likelihood of ulcer reoccurrences.  Thank you for allowing [place] to participate in this patient's care.  Theda Sers EMMA [facility] PA-C [facility] Office: [phone]  The patient was seen in conjunction with Dr. Bridgett Larsson today.  12/28/2014, 1:50 PM   Addendum  I have independently interviewed and examined the patient, and I agree with the physician assistant's findings.  Pt with sx varicosities in the setting of long standing CVI.  She has healed up an obvious L anterior shin VSU.  After 3 month trial of stockings, the patient should be evaluated for EVLA L GSV to prevent another VSU.  Adele Barthel, MD [facility] [place] Office: [phone] Pager: [phone]  12/28/2014, 2:23 PM

## 2014-12-31 ENCOUNTER — Telehealth: Payer: Self-pay | Admitting: *Deleted

## 2014-12-31 NOTE — Telephone Encounter (Signed)
-----   Message from Volanda Napoleon, MD sent at 12/28/2014  5:06 PM EDT ----- Please call and tell her that the labs still looked okay. I don't see any issues with abnormal protein.  pete

## 2015-01-04 ENCOUNTER — Encounter: Payer: Self-pay | Admitting: Vascular Surgery

## 2015-01-09 ENCOUNTER — Ambulatory Visit: Payer: Medicare Other | Admitting: Cardiology

## 2015-01-11 ENCOUNTER — Encounter: Payer: Self-pay | Admitting: Vascular Surgery

## 2015-01-15 ENCOUNTER — Ambulatory Visit (INDEPENDENT_AMBULATORY_CARE_PROVIDER_SITE_OTHER): Payer: Medicare Other | Admitting: *Deleted

## 2015-01-15 DIAGNOSIS — I83021 Varicose veins of left lower extremity with ulcer of thigh: Secondary | ICD-10-CM

## 2015-01-15 DIAGNOSIS — L97929 Non-pressure chronic ulcer of unspecified part of left lower leg with unspecified severity: Principal | ICD-10-CM

## 2015-01-15 DIAGNOSIS — I83029 Varicose veins of left lower extremity with ulcer of unspecified site: Secondary | ICD-10-CM

## 2015-01-15 NOTE — Progress Notes (Signed)
Pt came in for a stocking fitting. The ulcer on her left lower leg is healed but it appears that she is developing several new ulcers on her right lower leg. I spoke with Dr. Kellie Simmering who suggested that when she returns in 3 months to do a right leg reflux study since only the left leg was studied on her initial visit. Talked with the patient about elevation of the foot of her bed and putting both stockings on first thing in the morning before swelling begins. Her legs are red below the knee bilaterally and she has leg pain. She expressed understanding and we will see her 04/02/15.

## 2015-02-08 ENCOUNTER — Ambulatory Visit: Payer: Medicare Other | Admitting: Podiatry

## 2015-03-12 ENCOUNTER — Ambulatory Visit (INDEPENDENT_AMBULATORY_CARE_PROVIDER_SITE_OTHER): Payer: Medicare Other | Admitting: Neurology

## 2015-03-12 ENCOUNTER — Encounter: Payer: Self-pay | Admitting: Neurology

## 2015-03-12 VITALS — BP 140/76 | HR 70 | Resp 16 | Ht 62.0 in | Wt 133.8 lb

## 2015-03-12 DIAGNOSIS — M1990 Unspecified osteoarthritis, unspecified site: Secondary | ICD-10-CM | POA: Diagnosis not present

## 2015-03-12 DIAGNOSIS — L943 Sclerodactyly: Secondary | ICD-10-CM | POA: Diagnosis not present

## 2015-03-12 DIAGNOSIS — G4733 Obstructive sleep apnea (adult) (pediatric): Secondary | ICD-10-CM

## 2015-03-12 DIAGNOSIS — G4489 Other headache syndrome: Secondary | ICD-10-CM

## 2015-03-12 DIAGNOSIS — M069 Rheumatoid arthritis, unspecified: Secondary | ICD-10-CM

## 2015-03-12 MED ORDER — GABAPENTIN 300 MG PO CAPS: 300.0000 mg | ORAL_CAPSULE | Freq: Four times a day (QID) | ORAL | Status: DC

## 2015-03-12 NOTE — Progress Notes (Addendum)
[facility]  PATIENT: [name] DOB: [birth year]  REFERRING DOCTOR OR PCP:  Christiane Ha Stoneking SOURCE: patient and [facility] records  _________________________________   HISTORICAL  CHIEF COMPLAINT:  Headache and joint pain  HISTORY OF PRESENT ILLNESS:  [name] is an 81 year old woman who I have seen in the past at [facility] for joint pain, Raynaud's syndrome and mild sleep apnea. More recently, she has been having issues with headaches.  Scleroderma/Raynaud's/joint pain:  She has had Raynaud syndrome involving her hands for several years. ANA testing is positive and she has elevated anti-centromere antibodies. I was concerned about CREST or scleroderma in the past. 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   She has been diagnosed with scleroderma by rheumatology recently.  Unfortunately, Dr. Ouida Sills is going to be leaving  Leg pain/numb:   She has bilateral, left > right leg pain.   She has had a poorly healing ulcer on her left.  She has seen Dr. Bridgett Larsson (Vascular/vein surgeon)  Headache:   She has right >> left occipital pain.   Pain comes and goes.    It sometimes wakes her up.   Pain does not radiate but sometimes ear is also painful.         OSA:    She is sleeping well most nights but has been worse since running out of gabapentin  REVIEW OF SYSTEMS: Constitutional: No fevers, chills, sweats, or change in appetite Eyes: No visual changes, double vision, eye pain Ear, nose and throat: No hearing loss, ear pain, nasal congestion, sore throat Cardiovascular: No chest pain, palpitations Respiratory: No shortness of breath at rest or with exertion.   No wheezes GastrointestinaI: No nausea, vomiting, diarrhea, abdominal pain, fecal incontinence Genitourinary: No dysuria, urinary retention or frequency.  No nocturia. Musculoskeletal: No neck pain, back pain Integumentary: No rash, pruritus, skin lesions Neurological: as  above Psychiatric: No depression at this time.  No anxiety Endocrine: No palpitations, diaphoresis, change in appetite, change in weigh or increased thirst Hematologic/Lymphatic: No anemia, purpura, petechiae. Allergic/Immunologic: No itchy/runny eyes, nasal congestion, recent allergic reactions, rashes  ALLERGIES: Allergies  Allergen Reactions  . Zyrtec [Cetirizine Hcl] Hives  . Cetirizine Hives    HOME MEDICATIONS:  Current outpatient prescriptions:  .  aspirin 500 MG EC tablet, Take 1,000 mg by mouth at bedtime as needed. For pain, Disp: , Rfl:  .  BUTRANS 5 MCG/HR PTWK patch, , Disp: , Rfl:  .  calcium citrate-vitamin D (CITRACAL+D) 315-200 MG-UNIT per tablet, Take 1 tablet by mouth daily. , Disp: , Rfl:  .  Cholecalciferol (VITAMIN D) 2000 UNITS CAPS, Take 5,000 Units by mouth daily. , Disp: , Rfl:  .  COD LIVER OIL PO, Take 1 capsule by mouth daily as needed. , Disp: , Rfl:  .  furosemide (LASIX) 20 MG tablet, Take 1 tablet (20 mg total) by mouth daily., Disp: 90 tablet, Rfl: 1 .  gabapentin (NEURONTIN) 300 MG capsule, Take 300-600 mg by mouth 3 (three) times daily. Take one tablet in the morning and at noon. Take two tablet at night, Disp: , Rfl:  .  lisinopril (PRINIVIL,ZESTRIL) 20 MG tablet, Take 1 tablet (20 mg total) by mouth daily., Disp: 90 tablet, Rfl: 0 .  pantoprazole (PROTONIX) 40 MG tablet, Take 1 tablet (40 mg total) by mouth daily., Disp: 90 tablet, Rfl: 1 .  Polyethyl Glycol-Propyl Glycol (SYSTANE ULTRA PF) 0.4-0.3 % SOLN, Place 1 drop into both eyes 2 (two) times daily. ,  Disp: , Rfl:  .  polyethylene glycol (MIRALAX / GLYCOLAX) packet, Take 17 g by mouth daily as needed. , Disp: , Rfl:  .  Probiotic Product (ALIGN) 4 MG CAPS, Take by mouth. Once daily, Disp: , Rfl:  .  simvastatin (ZOCOR) 20 MG tablet, Take 1 tablet (20 mg total) by mouth at bedtime., Disp: 90 tablet, Rfl: 0 .  vitamin C (ASCORBIC ACID) 500 MG tablet, Take 500 mg by mouth daily., Disp: , Rfl:  .   vitamin E 400 UNIT capsule, Take 400 Units by mouth daily., Disp: , Rfl:  .  gabapentin (NEURONTIN) 300 MG capsule, Take 1 capsule (300 mg total) by mouth 4 (four) times daily., Disp: 120 capsule, Rfl: 11 .  potassium chloride (MICRO-K) 10 MEQ CR capsule, Take 1 capsule (10 mEq total) by mouth daily. (Patient not taking: Reported on 03/12/2015), Disp: 90 capsule, Rfl: 0 .  SANTYL ointment, , Disp: , Rfl:  .  silver sulfADIAZINE (SILVADENE) 1 % cream, Apply 1 application topically daily. (Patient not taking: Reported on 12/28/2014), Disp: 50 g, Rfl: 0 .  UNKNOWN TO PATIENT, 12-26-14  PT. GIVEN MEDICATION LIST TO TAKE HOME AND COMPARE WITH MEDICATIONS SHE HAS, AND CALL us WITH ANY DIFFERENCES., Disp: , Rfl:   PAST MEDICAL HISTORY: Past Medical History  Diagnosis Date  . Hypertension   . High cholesterol   . Mitral valve disorder   . Diverticulosis   . Blood dyscrasia     IGg  kappa  . GERD (gastroesophageal reflux disease)   . Arthritis     "bad; probably all over; lower back; hands, not RA" (06/23/2012)  . Osteoarthritis   . Fall at home     "fell in the bathtub yesterday; bruised my left arm" (06/23/2012)  . Aortic insufficiency   . Varicose veins     PAST SURGICAL HISTORY: Past Surgical History  Procedure Laterality Date  . Laparoscopy  08/25/2011    Procedure: LAPAROSCOPY DIAGNOSTIC;  Surgeon: Judieth Keens, DO;  Location: [facility];  Service: General;  Laterality: N/A;  reduction obturator hernia obturator hernia repair  . Patella fracture surgery  2011    right  . Total shoulder arthroplasty  06/23/2012    left  . Vaginal hysterectomy  ~ 1996  . Hernia repair    . Cataract extraction w/ intraocular lens  implant, bilateral  ~ 2003  . Total shoulder arthroplasty  06/23/2012    Procedure: TOTAL SHOULDER ARTHROPLASTY;  Surgeon: Marin Shutter, MD;  Location: [facility];  Service: Orthopedics;  Laterality: Left;  left total shoulder arthroplasty    FAMILY HISTORY: Family History    Problem Relation Age of Onset  . Heart disease Father   . Cancer Mother     Breast cancer    SOCIAL HISTORY:  History   Social History  . Marital Status: Single    Spouse Name: N/A  . Number of Children: 3  . Years of Education: N/A   Occupational History  .     Social History Main Topics  . Smoking status: Never Smoker   . Smokeless tobacco: Never Used     Comment: never used tobacco  . Alcohol Use: No     Comment: Rare  . Drug Use: No  . Sexual Activity: Not Currently   Other Topics Concern  . Not on file   Social History Narrative     PHYSICAL EXAM  Filed Vitals:   03/12/15 1138  BP: 140/76  Pulse: 70  Resp: 16  Height: 5\' 2"  (1.575 m)  Weight: 133 lb 12.8 oz (60.691 kg)    Body mass index is 24.47 kg/(m^2).   General: The patient is well-developed and well-nourished and in no acute distress  Eyes:  Funduscopic exam shows normal optic discs and retinal vessels.  Neck: The neck is supple, no carotid bruits are noted.  The neck is mildly tender.  Cardiovascular: The heart has a regular rate and rhythm with a normal S1 and S2. There were no murmurs, gallops or rubs.   Skin: She has mild sclerodactyly.  There is mild edema in the lower legs bilaterally.  Musculoskeletal:  Back is tender over pirformis muscles  Neurologic Exam  Mental status: The patient is alert and oriented x 3 at the time of the examination. The patient has apparent normal recent and remote memory, with an apparently normal attention span and concentration ability.   Speech is normal.  Cranial nerves: Extraocular movements are full. Pupils are equal, round, and reactive to light and accomodation.    Facial symmetry is present. There is good facial sensation to soft touch bilaterally.Facial strength is normal.  Trapezius and sternocleidomastoid strength is normal. No dysarthria is noted.  The tongue is midline, and the patient has symmetric elevation of the soft palate. No obvious  hearing deficits are noted.  Motor:  Muscle bulk is normal.   Tone is normal. Strength is  5 / 5 in all 4 extremities.   Sensory: Sensory testing is intact to pinprick, soft touch and vibration sensation in the arms and reduced in toes.  Coordination: Cerebellar testing reveals good finger-nose-finger and heel-to-shin bilaterally.  Gait and station: Station is normal.   Gait is arthritic. Tandem gait is normal. Romberg is negative.   Reflexes: Deep tendon reflexes are symmetric and normal bilaterally.   Plantar responses are flexor.    DIAGNOSTIC DATA (LABS, IMAGING, TESTING) - I reviewed patient records, labs, notes, testing and imaging myself where available.  Lab Results  Component Value Date   WBC 8.5 12/26/2014   HGB 10.6* 12/26/2014   HCT 33.6* 12/26/2014   MCV 97 12/26/2014   PLT 264 12/26/2014      Component Value Date/Time   NA 140 12/26/2014 1140   K 4.4 12/26/2014 1140   CL 101 12/26/2014 1140   CO2 28 12/26/2014 1140   GLUCOSE 81 12/26/2014 1140   BUN 29* 12/26/2014 1140   CREATININE 1.05 12/26/2014 1140   CREATININE 0.91 09/19/2013 1226   CALCIUM 9.3 12/26/2014 1140   PROT 6.9 12/26/2014 1140   ALBUMIN 4.0 12/26/2014 1140   AST 24 12/26/2014 1140   ALT 12 12/26/2014 1140   ALKPHOS 55 12/26/2014 1140   BILITOT 0.3 12/26/2014 1140   GFRNONAA 63* 09/05/2014 1605   GFRAA 74* 09/05/2014 1605   Lab Results  Component Value Date   CHOL 237* 09/19/2013   HDL 45 09/19/2013   LDLCALC 158* 09/19/2013   TRIG 172* 09/19/2013   CHOLHDL 5.3 09/19/2013   No results found for: HGBA1C No results found for: VITAMINB12 Lab Results  Component Value Date   TSH 3.930 09/19/2013       ASSESSMENT AND PLAN  Other headache syndrome  Arthritis  Arthritis or polyarthritis, rheumatoid  Obstructive apnea  Sclerodactyly   1.   Renew gabapentin 2.   I offered to do an occipital nerve block / splenius capitis trigger point injection but she prefers not to have  one at this time 3.  Advised to follow up with rheumatology and vascular surgery 4.   rtc one year or sooner if problems  45 minute face-to-face evaluation with greater than one half of the time counseling and coordinating care about her rheumatologic and neurologic issues.  Richard A. Felecia Shelling, MD, PhD 11/28/9859, 4:83 PM Certified in Neurology, Clinical Neurophysiology, Sleep Medicine, Pain Medicine and Neuroimaging  [facility] [address] [place], [place] [address] [phone]

## 2015-03-12 NOTE — Addendum Note (Signed)
Addended by: Arlice Colt A on: 03/12/2015 05:59 PM   Modules accepted: Level of Service

## 2015-03-27 ENCOUNTER — Other Ambulatory Visit (HOSPITAL_BASED_OUTPATIENT_CLINIC_OR_DEPARTMENT_OTHER): Payer: Medicare Other

## 2015-03-27 ENCOUNTER — Ambulatory Visit (HOSPITAL_BASED_OUTPATIENT_CLINIC_OR_DEPARTMENT_OTHER): Payer: Medicare Other | Admitting: Hematology & Oncology

## 2015-03-27 ENCOUNTER — Encounter: Payer: Self-pay | Admitting: Hematology & Oncology

## 2015-03-27 VITALS — BP 144/51 | HR 73 | Temp 97.6°F | Resp 16 | Ht 62.0 in | Wt 136.0 lb

## 2015-03-27 DIAGNOSIS — D500 Iron deficiency anemia secondary to blood loss (chronic): Secondary | ICD-10-CM

## 2015-03-27 DIAGNOSIS — L97909 Non-pressure chronic ulcer of unspecified part of unspecified lower leg with unspecified severity: Secondary | ICD-10-CM

## 2015-03-27 DIAGNOSIS — D509 Iron deficiency anemia, unspecified: Secondary | ICD-10-CM

## 2015-03-27 DIAGNOSIS — I872 Venous insufficiency (chronic) (peripheral): Secondary | ICD-10-CM

## 2015-03-27 DIAGNOSIS — D472 Monoclonal gammopathy: Secondary | ICD-10-CM

## 2015-03-27 LAB — CBC WITH DIFFERENTIAL (CANCER CENTER ONLY)
BASO#: 0.1 10*3/uL (ref 0.0–0.2)
BASO%: 0.6 % (ref 0.0–2.0)
EOS%: 2.6 % (ref 0.0–7.0)
Eosinophils Absolute: 0.2 10*3/uL (ref 0.0–0.5)
HCT: 33.1 % — ABNORMAL LOW (ref 34.8–46.6)
HGB: 10.6 g/dL — ABNORMAL LOW (ref 11.6–15.9)
LYMPH#: 1.5 10*3/uL (ref 0.9–3.3)
LYMPH%: 19.5 % (ref 14.0–48.0)
MCH: 29.8 pg (ref 26.0–34.0)
MCHC: 32 g/dL (ref 32.0–36.0)
MCV: 93 fL (ref 81–101)
MONO#: 0.9 10*3/uL (ref 0.1–0.9)
MONO%: 11.9 % (ref 0.0–13.0)
NEUT%: 65.4 % (ref 39.6–80.0)
NEUTROS ABS: 5.1 10*3/uL (ref 1.5–6.5)
PLATELETS: 223 10*3/uL (ref 145–400)
RBC: 3.56 10*6/uL — AB (ref 3.70–5.32)
RDW: 14.8 % (ref 11.1–15.7)
WBC: 7.7 10*3/uL (ref 3.9–10.0)

## 2015-03-27 LAB — RETICULOCYTES (CHCC)
ABS RETIC: 43.8 10*3/uL (ref 19.0–186.0)
RBC.: 3.65 MIL/uL — AB (ref 3.87–5.11)
RETIC CT PCT: 1.2 % (ref 0.4–2.3)

## 2015-03-27 LAB — CHCC SATELLITE - SMEAR

## 2015-03-27 NOTE — Progress Notes (Signed)
[facility] Follow Up Visit  Anthony Roland [id number] [birth year] 81 y.o. 03/27/2015   Principle Diagnosis:  IgG Kappa MGUS Intermittent iron deficiency anemia  Current Therapy:    Observation     Interim History:  Ms.  Zahn is back for followup.; She is still having problems with her legs. She is being seen by [facility] surgery. It sounds like they may need to do some type of procedure. She clearly has venous insufficiency.  Her She has a sore on the left leg. She says this is not healing up. She had arterial studies done. She will see a vascular surgeon. It sounds like she may have some vascular disease.  As far as her monoclonal studies go, she has had very low levels of protein. Her last M spike back in November was 0.5 g/dL. Her IgG level was 918mg /dL.  her Kappa light chain was 4.26 mg/dL.  She's had no fever. She's had no abdominal pain outside of some slight right lower quadrant abdominal discomfort. She's had no cough. She had no rashes. She's had no shortness of breath.  Overall, her performance status is ECOG 1.   Medications:  Current outpatient prescriptions:  .  aspirin 500 MG EC tablet, Take 1,000 mg by mouth at bedtime as needed. For pain, Disp: , Rfl:  .  BUTRANS 5 MCG/HR PTWK patch, , Disp: , Rfl:  .  calcium citrate-vitamin D (CITRACAL+D) 315-200 MG-UNIT per tablet, Take 1 tablet by mouth daily. , Disp: , Rfl:  .  Cholecalciferol (VITAMIN D) 2000 UNITS CAPS, Take 5,000 Units by mouth daily. , Disp: , Rfl:  .  COD LIVER OIL PO, Take 1 capsule by mouth daily as needed. , Disp: , Rfl:  .  furosemide (LASIX) 20 MG tablet, Take 1 tablet (20 mg total) by mouth daily., Disp: 90 tablet, Rfl: 1 .  gabapentin (NEURONTIN) 300 MG capsule, Take 300-600 mg by mouth 3 (three) times daily. Take one tablet in the morning and at noon. Take two tablet at night, Disp: , Rfl:  .  lisinopril (PRINIVIL,ZESTRIL) 20 MG tablet, Take 1 tablet (20 mg total) by mouth daily.,  Disp: 90 tablet, Rfl: 0 .  pantoprazole (PROTONIX) 40 MG tablet, Take 1 tablet (40 mg total) by mouth daily., Disp: 90 tablet, Rfl: 1 .  Polyethyl Glycol-Propyl Glycol (SYSTANE ULTRA PF) 0.4-0.3 % SOLN, Place 1 drop into both eyes 2 (two) times daily. , Disp: , Rfl:  .  polyethylene glycol (MIRALAX / GLYCOLAX) packet, Take 17 g by mouth daily as needed. , Disp: , Rfl:  .  potassium chloride (MICRO-K) 10 MEQ CR capsule, Take 1 capsule (10 mEq total) by mouth daily., Disp: 90 capsule, Rfl: 0 .  Probiotic Product (ALIGN) 4 MG CAPS, Take by mouth. Once daily, Disp: , Rfl:  .  simvastatin (ZOCOR) 20 MG tablet, Take 1 tablet (20 mg total) by mouth at bedtime., Disp: 90 tablet, Rfl: 0 .  vitamin C (ASCORBIC ACID) 500 MG tablet, Take 500 mg by mouth daily., Disp: , Rfl:  .  vitamin E 400 UNIT capsule, Take 400 Units by mouth daily., Disp: , Rfl:  .  UNKNOWN TO PATIENT, 12-26-14  PT. GIVEN MEDICATION LIST TO TAKE HOME AND COMPARE WITH MEDICATIONS SHE HAS, AND CALL us WITH ANY DIFFERENCES., Disp: , Rfl:   Allergies:  Allergies  Allergen Reactions  . Zyrtec [Cetirizine Hcl] Hives  . Cetirizine Hives    Past Medical History, Surgical history, Social history, and Family  History were reviewed and updated.  Review of Systems: As above  Physical Exam:  height is 5\' 2"  (1.575 m) and weight is 136 lb (61.689 kg). Her oral temperature is 97.6 F (36.4 C). Her blood pressure is 144/51 and her pulse is 73. Her respiration is 16.   Thin, elderly white female. Head and neck exam shows no ocular or oral lesions. She has no palpable cervical or supraclavicular lymph nodes. Lungs are clear. Cardiac exam regular rate and rhythm. Abdomen is soft. She has good bowel sounds. There is no fluid wave. There is no palpable liver or spleen. Back exam no tenderness over the spine.she has some kyphosis. Extremities shows a wrapping on the left leg. There is some slight edema bilaterally. She has some stasis dermatitis type  changes over on the lower legs bilaterally. Neurological exam is nonfocal. Skin exam shows no rashes. This still a little bit of erythema about the left lower leg near the ankle. Lab Results  Component Value Date   WBC 7.7 03/27/2015   HGB 10.6* 03/27/2015   HCT 33.1* 03/27/2015   MCV 93 03/27/2015   PLT 223 03/27/2015     Chemistry      Component Value Date/Time   NA 140 12/26/2014 1140   K 4.4 12/26/2014 1140   CL 101 12/26/2014 1140   CO2 28 12/26/2014 1140   BUN 29* 12/26/2014 1140   CREATININE 1.05 12/26/2014 1140   CREATININE 0.91 09/19/2013 1226      Component Value Date/Time   CALCIUM 9.3 12/26/2014 1140   ALKPHOS 55 12/26/2014 1140   AST 24 12/26/2014 1140   ALT 12 12/26/2014 1140   BILITOT 0.3 12/26/2014 1140         Impression and Plan: Ms. Boise is 81 year old white female. She has an  IgG Kappa MGUS. We have been following her for this for a few years. This has never been shown to be a problem. Her MGUS level has been stable.  Her anemia is stable. He'll be interesting to see what her iron studies show. I don't think she has had IV iron for a couple years.  The main problem for her is this venous insufficiency in her legs. She has stasis dermatitis. She had a couple ulcers. She is seen vascular surgery. I think she sees Dr. Bridgett Larsson next week.  I want to see her back in about 2 or 3 months. If her iron levels are low, I will bring her back in for an iron infusion.  She is on quite a few medications which might be posing an issue for her with iron absorption.  I spent about 35 minutes with her. A lot of time was talking to her about this venous insufficiency probably try to get her to understand what the vascular surgeons were trying to do.  Volanda Napoleon, MD 8/3/20164:41 PM

## 2015-03-28 ENCOUNTER — Other Ambulatory Visit: Payer: Self-pay | Admitting: *Deleted

## 2015-03-28 ENCOUNTER — Telehealth: Payer: Self-pay | Admitting: *Deleted

## 2015-03-28 DIAGNOSIS — D472 Monoclonal gammopathy: Secondary | ICD-10-CM

## 2015-03-28 LAB — FERRITIN CHCC: Ferritin: 12 ng/ml (ref 9–269)

## 2015-03-28 LAB — IRON AND TIBC CHCC
%SAT: 16 % — AB (ref 21–57)
Iron: 57 ug/dL (ref 41–142)
TIBC: 358 ug/dL (ref 236–444)
UIBC: 301 ug/dL (ref 120–384)

## 2015-03-28 NOTE — Telephone Encounter (Addendum)
Patient aware of results. Appointment made.  ----- Message from Volanda Napoleon, MD sent at 03/28/2015  1:31 PM EDT ----- Call - iron level is low!!  Need feraheme x 1 dose!!!  Please set up!!!  pete

## 2015-04-01 ENCOUNTER — Encounter: Payer: Self-pay | Admitting: Vascular Surgery

## 2015-04-01 ENCOUNTER — Ambulatory Visit: Payer: Medicare Other

## 2015-04-02 ENCOUNTER — Ambulatory Visit (HOSPITAL_COMMUNITY)
Admission: RE | Admit: 2015-04-02 | Discharge: 2015-04-02 | Disposition: A | Discharge status: Home / Self Care | Payer: Medicare Other | Source: Ambulatory Visit | Attending: Vascular Surgery | Admitting: Vascular Surgery

## 2015-04-02 ENCOUNTER — Ambulatory Visit (INDEPENDENT_AMBULATORY_CARE_PROVIDER_SITE_OTHER): Payer: Medicare Other | Admitting: Vascular Surgery

## 2015-04-02 ENCOUNTER — Encounter: Payer: Self-pay | Admitting: Vascular Surgery

## 2015-04-02 VITALS — BP 152/75 | HR 65 | Temp 98.3°F | Resp 18 | Ht 62.5 in | Wt 136.0 lb

## 2015-04-02 DIAGNOSIS — I83893 Varicose veins of bilateral lower extremities with other complications: Secondary | ICD-10-CM

## 2015-04-02 DIAGNOSIS — I83024 Varicose veins of left lower extremity with ulcer of heel and midfoot: Secondary | ICD-10-CM

## 2015-04-02 DIAGNOSIS — I83022 Varicose veins of left lower extremity with ulcer of calf: Secondary | ICD-10-CM | POA: Diagnosis not present

## 2015-04-02 DIAGNOSIS — R609 Edema, unspecified: Secondary | ICD-10-CM | POA: Insufficient documentation

## 2015-04-02 DIAGNOSIS — I83029 Varicose veins of left lower extremity with ulcer of unspecified site: Secondary | ICD-10-CM | POA: Insufficient documentation

## 2015-04-02 DIAGNOSIS — I83025 Varicose veins of left lower extremity with ulcer other part of foot: Secondary | ICD-10-CM

## 2015-04-02 DIAGNOSIS — I83021 Varicose veins of left lower extremity with ulcer of thigh: Secondary | ICD-10-CM | POA: Diagnosis not present

## 2015-04-02 DIAGNOSIS — I83023 Varicose veins of left lower extremity with ulcer of ankle: Secondary | ICD-10-CM

## 2015-04-02 DIAGNOSIS — I83028 Varicose veins of left lower extremity with ulcer other part of lower leg: Secondary | ICD-10-CM

## 2015-04-02 DIAGNOSIS — L97929 Non-pressure chronic ulcer of unspecified part of left lower leg with unspecified severity: Secondary | ICD-10-CM

## 2015-04-02 NOTE — Progress Notes (Signed)
Filed Vitals:   04/02/15 1552 04/02/15 1556  BP: 153/64 152/75  Pulse: 65   Temp: 98.3 F (36.8 C)   TempSrc: Oral   Resp: 18   Height: 5' 2.5" (1.588 m)   Weight: 136 lb (61.689 kg)   SpO2: 98%

## 2015-04-02 NOTE — Progress Notes (Signed)
Problems with Activities of Daily Living Secondary to Leg Pain  1. [name] has difficulty sleeping due to leg pain.    2. [name] has difficulty exercising due to severe leg pain.   3. [name] has difficulty going up and down steps due to severe leg pain.     Failure of  Conservative Therapy:  1. Worn 20-30 mm Hg thigh high compression hose >3 months with no relief of symptoms.  2. Frequently elevates legs-no relief of symptoms  3. Taken Ibuprofen 600 Mg TID with no relief of symptoms.   patient was seen regarding her venous stasis disease which healed in her left pretibial area approximately 3-4 months ago. Now presents with very superficial small ulceration over her right pretibial area. When formal venous duplex on the left 3 months ago and has had a formal venous duplex in the right today. She does have multiple components of complaints in her lower extremities. She reports severe cramping and weakness if she elevates her legs for prolonged period of time. Also reports a tingling and numbness around her feet bilaterally. She does have significant swelling from her knees down to her ankles bilaterally. No ulceration on the left and superficial ulceration on her right pretibial area. She does have diffuse tenderness over these areas as well.   He has no varicosities present. I imaged her saphenous vein with SonoSite bilaterally. Her vein is relatively small bilaterally.   Her formal venous duplex showed that these veins were small. She did have some mild reflux in her saphenous vein. She had significant bilateral deep venous reflux.   I very long discussion with the patient explaining I do not see any benefit from ablation of her great saphenous vein since this is getting minimal if any component of her venous hypertension. I did explain that with her severe deep venous reflux but the only treatment option would be elevation, compression and  Possible diarrhetic therapy. She is  very frustrated there is no other active treatment option. She has not been wearing her thigh-high compression. It is difficult for her to place these due to her arthritis and other issues. We have fitted her today with knee-high compression. I explained that this is nearly equal in benefit for her swelling and may be easier for her to tolerate. She will see [place] again on an as-needed basis

## 2015-04-04 ENCOUNTER — Ambulatory Visit (HOSPITAL_BASED_OUTPATIENT_CLINIC_OR_DEPARTMENT_OTHER): Payer: Medicare Other

## 2015-04-04 VITALS — BP 125/66 | HR 67 | Temp 98.1°F | Resp 18

## 2015-04-04 DIAGNOSIS — D500 Iron deficiency anemia secondary to blood loss (chronic): Secondary | ICD-10-CM | POA: Diagnosis present

## 2015-04-04 DIAGNOSIS — D472 Monoclonal gammopathy: Secondary | ICD-10-CM

## 2015-04-04 MED ORDER — SODIUM CHLORIDE 0.9 % IV SOLN
510.0000 mg | Freq: Once | INTRAVENOUS | Status: AC
  Administered 2015-04-04: 510 mg via INTRAVENOUS
  Filled 2015-04-04: qty 17

## 2015-04-04 MED ORDER — SODIUM CHLORIDE 0.9 % IV SOLN
INTRAVENOUS | Status: DC
  Administered 2015-04-04: 13:00:00 via INTRAVENOUS

## 2015-04-04 NOTE — Patient Instructions (Signed)

## 2015-04-22 ENCOUNTER — Other Ambulatory Visit: Payer: Self-pay | Admitting: Cardiology

## 2015-04-22 NOTE — Telephone Encounter (Signed)
REFILL 

## 2015-05-23 ENCOUNTER — Encounter (HOSPITAL_BASED_OUTPATIENT_CLINIC_OR_DEPARTMENT_OTHER): Payer: Self-pay | Admitting: *Deleted

## 2015-05-23 ENCOUNTER — Emergency Department (HOSPITAL_BASED_OUTPATIENT_CLINIC_OR_DEPARTMENT_OTHER): Payer: Medicare Other

## 2015-05-23 ENCOUNTER — Emergency Department (HOSPITAL_BASED_OUTPATIENT_CLINIC_OR_DEPARTMENT_OTHER)
Admission: EM | Admit: 2015-05-23 | Discharge: 2015-05-23 | Disposition: A | Discharge status: Home / Self Care | Payer: Medicare Other | Attending: Emergency Medicine | Admitting: Emergency Medicine

## 2015-05-23 DIAGNOSIS — Y998 Other external cause status: Secondary | ICD-10-CM | POA: Diagnosis not present

## 2015-05-23 DIAGNOSIS — S99912A Unspecified injury of left ankle, initial encounter: Secondary | ICD-10-CM | POA: Diagnosis present

## 2015-05-23 DIAGNOSIS — W010XXA Fall on same level from slipping, tripping and stumbling without subsequent striking against object, initial encounter: Secondary | ICD-10-CM | POA: Diagnosis not present

## 2015-05-23 DIAGNOSIS — Z7982 Long term (current) use of aspirin: Secondary | ICD-10-CM | POA: Diagnosis not present

## 2015-05-23 DIAGNOSIS — Z79899 Other long term (current) drug therapy: Secondary | ICD-10-CM | POA: Insufficient documentation

## 2015-05-23 DIAGNOSIS — Y9289 Other specified places as the place of occurrence of the external cause: Secondary | ICD-10-CM | POA: Diagnosis not present

## 2015-05-23 DIAGNOSIS — E780 Pure hypercholesterolemia: Secondary | ICD-10-CM | POA: Insufficient documentation

## 2015-05-23 DIAGNOSIS — M1990 Unspecified osteoarthritis, unspecified site: Secondary | ICD-10-CM | POA: Diagnosis not present

## 2015-05-23 DIAGNOSIS — Y9389 Activity, other specified: Secondary | ICD-10-CM | POA: Diagnosis not present

## 2015-05-23 DIAGNOSIS — W19XXXA Unspecified fall, initial encounter: Secondary | ICD-10-CM

## 2015-05-23 DIAGNOSIS — S82832A Other fracture of upper and lower end of left fibula, initial encounter for closed fracture: Secondary | ICD-10-CM | POA: Diagnosis not present

## 2015-05-23 DIAGNOSIS — S82892A Other fracture of left lower leg, initial encounter for closed fracture: Secondary | ICD-10-CM

## 2015-05-23 DIAGNOSIS — K219 Gastro-esophageal reflux disease without esophagitis: Secondary | ICD-10-CM | POA: Diagnosis not present

## 2015-05-23 DIAGNOSIS — I10 Essential (primary) hypertension: Secondary | ICD-10-CM | POA: Insufficient documentation

## 2015-05-23 MED ORDER — TRAMADOL HCL 50 MG PO TABS: 50.0000 mg | ORAL_TABLET | Freq: Four times a day (QID) | ORAL | Status: AC | PRN

## 2015-05-23 MED ORDER — TRAMADOL HCL 50 MG PO TABS
50.0000 mg | ORAL_TABLET | Freq: Once | ORAL | Status: AC
  Administered 2015-05-23: 50 mg via ORAL
  Filled 2015-05-23: qty 1

## 2015-05-23 NOTE — ED Notes (Signed)
Pt was able to reach her brother by phone who agreed to come get her.  Stated he would be here in 30 min. Pt assisted to get dressed and to [place].  Taken to pharmacy to pick up her RX and then taken to [place] were she could see her brother pull up to the front door. Nurse 1st and Reg staff aware that pt is waiting for her brother and will assist pt to the car.

## 2015-05-23 NOTE — ED Notes (Signed)
Patient states she tripped coming into the door two days ago and now has bilateral leg, ankle and knee pain.

## 2015-05-23 NOTE — Care Management Note (Signed)
Case Management Note  Patient Details  Name: [name] MRN: [id number] Date of Birth: 1928/10/27  Subjective/Objective:          81 yr old female with left ankle fracture and left hip injury that is presently being further examined pt EDP with CT at this time.  EDP informed CM pt left ankle to be splinted and pt is to be NWB Requesting w/c and walker for pt  Reports pt lives alone but has neighbors that brought her to [facility] and had agreed to assist her in getting home.  Reports her home is one level  Pt informed CM she also has access to call taxi as needed Pt states she did not feel she needed Private duty nursing but agreed to home health services at time of d/c home     Pt confirms pcp is Hal stoneking Pt confirmed her home # as [phone] and cell as [phone]   Action/Plan:  CM spoke with pt who confirms her brother will be able to pick her up from [facility] and take her home (she was speaking with brother when her RN, Roselyn Reef, gave her the telephone with CM awaiting to speak to her)  CM spoke with [place] [facility] to confirm services available to [facility] Discussed DME needs Decision to obtain w/c and allow HHPT access need for walker  Expected Discharge Date:   pending labs ? May 23 2015                Expected Discharge Plan:    home with home care services, DME  In-House Referral:   ED SW   Discharge planning Services   home with home health, DME  Post Acute Care Choice:   home health, DME Choice offered to:   pt  DME Arranged:   wheelchair DME Agency[facility]   [facility]   [facility] Arranged:   HHRN/PT HH Agency:   [facility]   Status of Service:   completd   Additional Comments:  Robbie Lis, RN 05/23/2015, 2:19 PM

## 2015-05-23 NOTE — ED Notes (Signed)
Patient speaking with case manager Maudie Mercury from [place] regarding help for possible home care related to fracture

## 2015-05-23 NOTE — ED Notes (Signed)
Roselyn Reef, RN and myself discussed options of getting pt home safely with the pt at bedside. Pt states her brother is not well enough to help her, she is afraid that he too could be hurt during the process. Pt is reluctant to pursue PTAR transport home due to feeling it is unnecessary and that she would be embarrassed if her neighbors saw her arrive in an ambulance. Roselyn Reef and myself discussed with pt the reason that taxi transport home may be unreliable and unsafe. Pt verbalizes understanding but states this is still her preferred method of transport at this time.

## 2015-05-23 NOTE — Discharge Instructions (Signed)
Ankle Fracture A fracture is a break in a bone. A cast or splint may be used to protect the ankle and heal the break. Sometimes, surgery is needed. HOME CARE  Use crutches as told by your doctor. It is very important that you use your crutches correctly.  Do not put weight or pressure on the injured ankle until told by your doctor.  Keep your ankle raised (elevated) when sitting or lying down.  Apply ice to the ankle:  Put ice in a plastic bag.  Place a towel between your cast and the bag.  Leave the ice on for 20 minutes, 2-3 times a day.  If you have a plaster or fiberglass cast:  Do not try to scratch under the cast with any objects.  Check the skin around the cast every day. You may put lotion on red or sore areas.  Keep your cast dry and clean.  If you have a plaster splint:  Wear the splint as told by your doctor.  You can loosen the elastic around the splint if your toes get numb, tingle, or turn cold or blue.  Do not put pressure on any part of your cast or splint. It may break. Rest your plaster splint or cast only on a pillow the first 24 hours until it is fully hardened.  Cover your cast or splint with a plastic bag during showers.  Do not lower your cast or splint into water.  Take medicine as told by your doctor.  Do not drive until your doctor says it is safe.  Follow-up with your doctor as told. It is very important that you go to your follow-up visits. GET HELP IF: The swelling and discomfort gets worse.  GET HELP RIGHT AWAY IF:   Your splint or cast breaks.  You continue to have very bad pain.  You have new pain or swelling after your splint or cast was put on.  Your skin or toes below the injured ankle:  Turn blue or gray.  Feel cold, numb, or you cannot feel them.  There is a bad smell or yellowish white fluid (pus) coming from under the splint or cast. MAKE SURE YOU:   Understand these instructions.  Will watch your  condition.  Will get help right away if you are not doing well or get worse. Document Released: 06/07/2009 Document Revised: 05/31/2013 Document Reviewed: 03/09/2013 [facility] Patient Information 2015 [place], [place]. This information is not intended to replace advice given to you by your health care provider. Make sure you discuss any questions you have with your health care provider.   Keep the splint dry. No weight bearing on the left foot. Follow-up with orthopedics call and make an appointment. Case manager is working on home arrangements for wheelchair and walker and assistance in the home. Follow-up with your primary care doctor as needed. Take the tramadol as needed for pain.

## 2015-05-23 NOTE — ED Provider Notes (Signed)
CSN: [id number]     Arrival date & time 05/23/15  1055 History   First MD Initiated Contact with Patient 05/23/15 1146     Chief Complaint  Patient presents with  . Fall    Bilateral leg pain     (Consider location/radiation/quality/duration/timing/severity/associated sxs/prior Treatment) Patient is a 81 y.o. female presenting with fall.  Fall Pertinent negatives include no chest pain, no abdominal pain, no headaches and no shortness of breath.   Patient status post fall 2 days ago tripped over her front doorway and fell twisting her left ankle and kind of fell on her right hip area. Patient denied any loss of consciousness. She been having some difficulty walking on the left foot noticed a lot of swelling came by cab today for evaluation. Patient lives in the home by herself its on one level no immediate family in the area. Patient without any other complaints other than the left ankle and the right hip area.  Past Medical History  Diagnosis Date  . Hypertension   . High cholesterol   . Mitral valve disorder   . Diverticulosis   . Blood dyscrasia     IGg  kappa  . GERD (gastroesophageal reflux disease)   . Arthritis     "bad; probably all over; lower back; hands, not RA" (06/23/2012)  . Osteoarthritis   . Fall at home     "fell in the bathtub yesterday; bruised my left arm" (06/23/2012)  . Aortic insufficiency   . Varicose veins    Past Surgical History  Procedure Laterality Date  . Laparoscopy  08/25/2011    Procedure: LAPAROSCOPY DIAGNOSTIC;  Surgeon: Judieth Keens, DO;  Location: [facility];  Service: General;  Laterality: N/A;  reduction obturator hernia obturator hernia repair  . Patella fracture surgery  2011    right  . Total shoulder arthroplasty  06/23/2012    left  . Vaginal hysterectomy  ~ 1996  . Hernia repair    . Cataract extraction w/ intraocular lens  implant, bilateral  ~ 2003  . Total shoulder arthroplasty  06/23/2012    Procedure: TOTAL SHOULDER  ARTHROPLASTY;  Surgeon: Marin Shutter, MD;  Location: [facility];  Service: Orthopedics;  Laterality: Left;  left total shoulder arthroplasty   Family History  Problem Relation Age of Onset  . Heart disease Father   . Cancer Mother     Breast cancer   Social History  Substance Use Topics  . Smoking status: Never Smoker   . Smokeless tobacco: Never Used     Comment: never used tobacco  . Alcohol Use: No     Comment: Rare   OB History    No data available     Review of Systems  Constitutional: Negative for fever.  HENT: Negative for congestion.   Eyes: Negative for redness and visual disturbance.  Respiratory: Negative for shortness of breath.   Cardiovascular: Negative for chest pain.  Gastrointestinal: Negative for abdominal pain.  Genitourinary: Negative for dysuria.  Musculoskeletal: Negative for back pain and neck pain.  Skin: Negative for rash.  Neurological: Negative for headaches.  Hematological: Does not bruise/bleed easily.  Psychiatric/Behavioral: Negative for confusion and agitation.      Allergies  Zyrtec and Cetirizine  Home Medications   Prior to Admission medications   Medication Sig Start Date End Date Taking? Authorizing Provider  aspirin 500 MG EC tablet Take 1,000 mg by mouth at bedtime as needed. For pain   Yes Historical Provider, MD  BUTRANS 5 MCG/HR PTWK patch  12/06/14  Yes Historical Provider, MD  calcium citrate-vitamin D (CITRACAL+D) 315-200 MG-UNIT per tablet Take 1 tablet by mouth daily.    Yes Historical Provider, MD  COD LIVER OIL PO Take 1 capsule by mouth daily as needed.    Yes Historical Provider, MD  furosemide (LASIX) 20 MG tablet Take 1 tablet (20 mg total) by mouth daily. 07/16/14  Yes Lanice Shirts, MD  gabapentin (NEURONTIN) 300 MG capsule Take 300-600 mg by mouth 3 (three) times daily. Take one tablet in the morning and at noon. Take two tablet at night   Yes Historical Provider, MD  lisinopril (PRINIVIL,ZESTRIL) 20 MG  tablet Take 1 tablet (20 mg total) by mouth daily. NEED OV. 04/22/15  Yes Lelon Perla, MD  pantoprazole (PROTONIX) 40 MG tablet Take 1 tablet (40 mg total) by mouth daily. 07/16/14  Yes Lanice Shirts, MD  polyethylene glycol (MIRALAX / GLYCOLAX) packet Take 17 g by mouth daily as needed.    Yes Historical Provider, MD  Probiotic Product (ALIGN) 4 MG CAPS Take by mouth. Once daily   Yes Historical Provider, MD  simvastatin (ZOCOR) 20 MG tablet Take 1 tablet (20 mg total) by mouth at bedtime. 09/28/14  Yes Lanice Shirts, MD  vitamin C (ASCORBIC ACID) 500 MG tablet Take 500 mg by mouth daily.   Yes Historical Provider, MD  vitamin E 400 UNIT capsule Take 400 Units by mouth daily.   Yes Historical Provider, MD  Cholecalciferol (VITAMIN D) 2000 UNITS CAPS Take 5,000 Units by mouth daily.     Historical Provider, MD  Polyethyl Glycol-Propyl Glycol (SYSTANE ULTRA PF) 0.4-0.3 % SOLN Place 1 drop into both eyes 2 (two) times daily.     Historical Provider, MD  potassium chloride (MICRO-K) 10 MEQ CR capsule Take 1 capsule (10 mEq total) by mouth daily. 07/23/14   Lanice Shirts, MD  traMADol (ULTRAM) 50 MG tablet Take 1 tablet (50 mg total) by mouth every 6 (six) hours as needed. 05/23/15   Fredia Sorrow, MD  UNKNOWN TO PATIENT 12-26-14  PT. GIVEN MEDICATION LIST TO TAKE HOME AND COMPARE WITH MEDICATIONS SHE HAS, AND CALL us WITH ANY DIFFERENCES.    Historical Provider, MD   BP 179/64 mmHg  Pulse 65  Temp(Src) 97 F (36.1 C) (Oral)  Resp 18  Ht 5\' 2"  (1.575 m)  Wt 129 lb (58.514 kg)  BMI 23.59 kg/m2  SpO2 100% Physical Exam  Constitutional: She is oriented to person, place, and time. She appears well-developed and well-nourished. No distress.  HENT:  Head: Normocephalic and atraumatic.  Mouth/Throat: Oropharynx is clear and moist.  Eyes: Conjunctivae and EOM are normal. Pupils are equal, round, and reactive to light.  Neck: Normal range of motion. Neck supple.  Cardiovascular:  Normal rate and regular rhythm.   Pulmonary/Chest: Effort normal and breath sounds normal.  Abdominal: Soft. Bowel sounds are normal.  Musculoskeletal: Normal range of motion. She exhibits edema and tenderness.  Patient was significant swelling to the left leg tenderness to palpation lateral aspect of the ankle. Cap refill is 2 seconds. Good movement of toes and sensations intact. Patient also with discomfort with range of motion of the right thigh. No obvious deformity distally Refill is also 2 seconds no swelling.  Neurological: She is alert and oriented to person, place, and time. No cranial nerve deficit. She exhibits normal muscle tone. Coordination normal.  Skin: Skin is warm. No rash noted.  Nursing  note and vitals reviewed.   ED Course  Procedures (including critical care time) Labs Review Labs Reviewed - No data to display  Imaging Review Dg Ankle Complete Left  05/23/2015   CLINICAL DATA:  [place] 2 days ago.  Right thigh and left ankle pain.  EXAM: LEFT ANKLE COMPLETE - 3+ VIEW; RIGHT FEMUR 2 VIEWS  COMPARISON:  None.  FINDINGS: Right femur:  The right hip is normally located. Remote right-sided pubic rami fractures are noted. No acute femur fracture. Orthopedic hardware noted fixing a remote patellar fracture. No complicating features. The knee joint is maintained. Vascular calcifications are noted.  Left ankle:  There is an oblique coursing nondisplaced fracture involving the distal fibular shaft above the level of the ankle mortise. The tibia is intact. The talus is intact. The mid and hindfoot bony structures are intact.  IMPRESSION: 1. Nondisplaced distal left fibular shaft fracture. 2. No right femur fracture.   Electronically Signed   By: Marijo Sanes M.D.   On: 05/23/2015 12:14   Ct Hip Right Wo Contrast  05/23/2015   CLINICAL DATA:  Fall today. Twisting injury. Low back and RIGHT hip pain. Initial encounter.  EXAM: CT OF THE RIGHT HIP WITHOUT CONTRAST  TECHNIQUE: Multidetector  CT imaging of the right hip was performed according to the standard protocol. Multiplanar CT image reconstructions were also generated.  COMPARISON:  CT 03/04/2014.  FINDINGS: There is no displaced proximal femur fracture. Mild RIGHT hip osteoarthritis is present. There is an old RIGHT obturator ring fracture with residual deformity. This appears healed. Healed complementary RIGHT sacral ala fracture. All of these healed fractures were visible on the prior exam 03/05/2014. Severe pubic symphysis degenerative disease. This also appears chronic and unchanged. Configuration of the sacrum is similar to the prior CT. There is no large hip effusion. Femoral atherosclerosis at iliofemoral atherosclerosis. Colonic diverticulosis. Hysterectomy. RIGHT sacroiliac joint osteoarthritis.  If there is high clinical suspicion for occult hip fracture or the patient refuses to weightbear, consider further evaluation with MRI. Although CT is expeditious, evidence is lacking regarding accuracy of CT over plain film radiography.  IMPRESSION: 1. No acute osseous abnormality. 2. Healed RIGHT obturator ring fractures with complementary healed RIGHT sacral ala fracture.   Electronically Signed   By: Dereck Ligas M.D.   On: 05/23/2015 14:40   Dg Femur, Min 2 Views Right  05/23/2015   CLINICAL DATA:  [place] 2 days ago.  Right thigh and left ankle pain.  EXAM: LEFT ANKLE COMPLETE - 3+ VIEW; RIGHT FEMUR 2 VIEWS  COMPARISON:  None.  FINDINGS: Right femur:  The right hip is normally located. Remote right-sided pubic rami fractures are noted. No acute femur fracture. Orthopedic hardware noted fixing a remote patellar fracture. No complicating features. The knee joint is maintained. Vascular calcifications are noted.  Left ankle:  There is an oblique coursing nondisplaced fracture involving the distal fibular shaft above the level of the ankle mortise. The tibia is intact. The talus is intact. The mid and hindfoot bony structures are intact.   IMPRESSION: 1. Nondisplaced distal left fibular shaft fracture. 2. No right femur fracture.   Electronically Signed   By: Marijo Sanes M.D.   On: 05/23/2015 12:14   I have personally reviewed and evaluated these images and lab results as part of my medical decision-making.   EKG Interpretation None      MDM   Final diagnoses:  Ankle fracture, left, closed, initial encounter  Fall, initial encounter  The patient with a fall 2 days ago. Patient tripped over her entrance. Patient with complaint of left ankle pain and right hip pain. Swelling to the left ankle x-rays reveal a distal fibula fracture without displacement. X-rays of the right hip showed evidence of a remote, old, pubic rami fracture but nothing acute. CT scan was done of that area as well and shows no evidence of hip fracture or acute fracture.  Patient lives by herself. Case manager contacted to make arrangements for help at home to include wheelchair and walker. They are working on that currently. Patient does have a neighbor that could bring her here. Patient came here in a cab. Patient's home is on one level. Patient does not have any family in the area. Case manager aware of all this.  According to case manager based on the injury findings insurance would not cover admission at this time and they recommend that she be discharged home with home health care and assistance with wheelchair and walker.  Patient without any other injuries. Denied hitting her head.    Fredia Sorrow, MD 05/23/15 [phone]

## 2015-05-23 NOTE — Progress Notes (Signed)
Cm spoke with Dr Tamera Punt Pt is scheduled for d/c home with home health resource CM updated Dr Tamera Punt on resources & DME obtained for pt  Cm updated Pura Spice pt is being d/c home

## 2015-05-23 NOTE — Progress Notes (Signed)
CSW received call from [facility]. Patient tripped a few days ago, and has been at home alone by herself maneuvering around by herself. Per physician, pt is needng assistance with DME and home heath services. CSW consulted with ED Rn CM who will follow up with nurse at patient at [facility].   Belia Heman, [place] Work  [employer] [phone]

## 2015-05-24 NOTE — Progress Notes (Signed)
Confirmation fax received for DME (light weight wheelchair) narrative faxed ATTN [place] or Levada Dy C to [phone]

## 2015-05-24 NOTE — Progress Notes (Deleted)
Patient suffers from left ankle fracture and left hip injury and is non weight bearing which impairs their ability to perform daily activities like mobility, bathing and dressing in the home. A crutch, a cane will not resolve  issue with performing activities of daily living. A wheelchair will allow patient to safely perform daily activities. Patient is not able to propel themselves in the home using a standard weight wheelchair due to weakness because of left ankle fracture and left hip injury and is non weight bearing . Pt weight is 129 pounds. Patient can self propel in the lightweight wheelchair.  Accessories: elevating leg rests (ELRs), wheel locks, extensions and anti-tippers.

## 2015-05-24 NOTE — Progress Notes (Signed)
ED CM received a call from [facility] home care to confirm orders for pt CM in basket Melissa about orders per her recommendation and left her a voice message CM received a message from [place] about pt call to her about dme and home services  Cm spoke with [facility] when she returned a call to Cm  CM faxed orders to [phone] with fax confirmation CM called pt to update her on status of dme and home services  Pt informed CM she has been active today and not completely adhering to [facility] recommendation CM encouraged compliance to [facility] recommendation with elevation of extremity  Cm reviewed information Cm reviewed with pt on 05/23/15 Pt states she was "a little tired and confused yesterday"  CM reviewed in details medicare guidelines, home health [facility]) (length of stay in home, types of [facility] staff available, coverage, primary caregiver, up to 24 hrs before services may be started) and Private duty nursing (PDN-coverage, length of stay in the home types of staff available). CM provided pt with [facility] contact number and a few of the PDN contact numbers. Pt only wanted 3 PDN agency contact information.  Pt informed Cm she did have a daughter and 2 sons with one daughter and son in [place]" and her son, "Cecilie Lowers who will probably be by tomorrow" lives close by [facility] her brother is close by but not as helpful.   Cm discussed [facility] [place] when pt asked if home health staff will go get food Pt states her pharmacy "[facility] medicine if I ask them"   Pt requesting assist with meals on wheels  CM recommended having her or son, Cecilie Lowers check with [facility] on fee less life alert system  [place] [facility] left a message stating orders received (Cm still speaking with pt when Melissa left voice message on CM mobile) [phone] Left message at [place] meals on wheels line [phone] (after holding for 6 minutes) Provided referral information for meals on wheels for pt request  call to CM mobile or pt home & mobile numbers  [place] called to update CM on pt home services Informed her Cm was speaking with pt and discussed pt compliance with [facility]B recommendations

## 2015-05-24 NOTE — Progress Notes (Signed)
Patient suffers from left ankle fracture and left hip injury and is non weight bearing which impairs their ability to perform daily activities like mobility, bathing and dressing in the home. A crutch, a cane will not resolve  issue with performing activities of daily living. A wheelchair will allow patient to safely perform daily activities. Patient is not able to propel themselves in the home using a standard weight wheelchair due to weakness because of left ankle fracture and left hip injury and is non weight bearing . Pt weight is 129 pounds. Patient can self propel in the lightweight wheelchair.  Accessories: elevating leg rests (ELRs), wheel locks, extensions and anti-tippers.

## 2015-06-27 ENCOUNTER — Telehealth: Payer: Self-pay | Admitting: Hematology & Oncology

## 2015-06-27 ENCOUNTER — Ambulatory Visit: Payer: Medicare Other | Admitting: Hematology & Oncology

## 2015-06-27 ENCOUNTER — Other Ambulatory Visit: Payer: Medicare Other

## 2015-06-27 NOTE — Telephone Encounter (Signed)
Patient called and cx 06/27/15 apt and resch for 08/05/15. Rn was notified

## 2015-08-05 ENCOUNTER — Ambulatory Visit: Payer: Medicare Other | Admitting: Hematology & Oncology

## 2015-08-05 ENCOUNTER — Other Ambulatory Visit: Payer: Medicare Other

## 2015-08-08 ENCOUNTER — Other Ambulatory Visit: Payer: Self-pay | Admitting: Cardiology

## 2015-08-09 ENCOUNTER — Encounter: Payer: Self-pay | Admitting: Hematology & Oncology

## 2015-08-09 ENCOUNTER — Other Ambulatory Visit (HOSPITAL_BASED_OUTPATIENT_CLINIC_OR_DEPARTMENT_OTHER): Payer: Medicare Other

## 2015-08-09 ENCOUNTER — Other Ambulatory Visit: Payer: Self-pay | Admitting: Pain Medicine

## 2015-08-09 ENCOUNTER — Ambulatory Visit (HOSPITAL_BASED_OUTPATIENT_CLINIC_OR_DEPARTMENT_OTHER): Payer: Medicare Other | Admitting: Hematology & Oncology

## 2015-08-09 VITALS — BP 150/73 | HR 95 | Temp 97.5°F | Resp 16 | Ht 62.0 in

## 2015-08-09 DIAGNOSIS — D509 Iron deficiency anemia, unspecified: Secondary | ICD-10-CM

## 2015-08-09 DIAGNOSIS — M25511 Pain in right shoulder: Secondary | ICD-10-CM

## 2015-08-09 DIAGNOSIS — D508 Other iron deficiency anemias: Secondary | ICD-10-CM

## 2015-08-09 DIAGNOSIS — D472 Monoclonal gammopathy: Secondary | ICD-10-CM | POA: Diagnosis present

## 2015-08-09 DIAGNOSIS — D500 Iron deficiency anemia secondary to blood loss (chronic): Secondary | ICD-10-CM

## 2015-08-09 LAB — COMPREHENSIVE METABOLIC PANEL
ALBUMIN: 3.8 g/dL (ref 3.5–5.0)
ALK PHOS: 71 U/L (ref 40–150)
ALT: 13 U/L (ref 0–55)
ANION GAP: 10 meq/L (ref 3–11)
AST: 23 U/L (ref 5–34)
BUN: 21 mg/dL (ref 7.0–26.0)
CALCIUM: 9.5 mg/dL (ref 8.4–10.4)
CHLORIDE: 106 meq/L (ref 98–109)
CO2: 27 mEq/L (ref 22–29)
CREATININE: 0.9 mg/dL (ref 0.6–1.1)
EGFR: 58 mL/min/{1.73_m2} — ABNORMAL LOW (ref >90)
Glucose: 105 mg/dl (ref 70–140)
POTASSIUM: 3.6 meq/L (ref 3.5–5.1)
Sodium: 142 mEq/L (ref 136–145)
Total Bilirubin: <0.3 mg/dL (ref 0.20–1.20)
Total Protein: 7.4 g/dL (ref 6.4–8.3)

## 2015-08-09 LAB — CBC WITH DIFFERENTIAL (CANCER CENTER ONLY)
BASO#: 0 10*3/uL (ref 0.0–0.2)
BASO%: 0.5 % (ref 0.0–2.0)
EOS ABS: 0.1 10*3/uL (ref 0.0–0.5)
EOS%: 1.6 % (ref 0.0–7.0)
HEMATOCRIT: 39.8 % (ref 34.8–46.6)
HEMOGLOBIN: 12.8 g/dL (ref 11.6–15.9)
LYMPH#: 1.3 10*3/uL (ref 0.9–3.3)
LYMPH%: 15.5 % (ref 14.0–48.0)
MCH: 32.1 pg (ref 26.0–34.0)
MCHC: 32.2 g/dL (ref 32.0–36.0)
MCV: 100 fL (ref 81–101)
MONO#: 0.8 10*3/uL (ref 0.1–0.9)
MONO%: 9.2 % (ref 0.0–13.0)
NEUT#: 5.9 10*3/uL (ref 1.5–6.5)
NEUT%: 73.2 % (ref 39.6–80.0)
Platelets: 210 10*3/uL (ref 145–400)
RBC: 3.99 10*6/uL (ref 3.70–5.32)
RDW: 12.9 % (ref 11.1–15.7)
WBC: 8.1 10*3/uL (ref 3.9–10.0)

## 2015-08-09 NOTE — Progress Notes (Signed)
[facility] Hematology and Oncology Follow Up Visit  [name] OO:[id number] [birth year] 81 y.o. 08/09/2015   Principle Diagnosis:  IgG Kappa MGUS Intermittent iron deficiency anemia  Current Therapy:    Observation     Interim History:  [name] is back for followup.; She comes in with a walking boot on her left lower leg. She apparently fell and fractured her left ankle. This happened back in October.  She's been having some problems with vascular issues with her legs. She was seen by one of the vascular groups in town. She liked one of the doctors but really felt that she was disrespected by another one. She really is still mad about this.   We gave her iron which she was last here in August. At that point time, her ferritin was 12 with an iron saturation of 16%.  Her medical studies have never been a problem. We have been following these.  She's having some problems with one of her sons. Apparently, he is doing drugs. He apparently is staying with one of his sisters. She talked to me quite a bit about this.  She is not having any problems going to the bathroom.     Overall, her performance status is ECOG 1.   Medications:  Current outpatient prescriptions:  .  aspirin 500 MG EC tablet, Take 1,000 mg by mouth at bedtime as needed. For pain, Disp: , Rfl:  .  BUTRANS 5 MCG/HR PTWK patch, , Disp: , Rfl:  .  calcium citrate-vitamin D (CITRACAL+D) 315-200 MG-UNIT per tablet, Take 1 tablet by mouth daily. , Disp: , Rfl:  .  Cholecalciferol (VITAMIN D) 2000 UNITS CAPS, Take 5,000 Units by mouth daily. , Disp: , Rfl:  .  COD LIVER OIL PO, Take 1 capsule by mouth daily as needed. , Disp: , Rfl:  .  furosemide (LASIX) 20 MG tablet, Take 1 tablet (20 mg total) by mouth daily., Disp: 90 tablet, Rfl: 1 .  gabapentin (NEURONTIN) 300 MG capsule, Take 300-600 mg by mouth 3 (three) times daily. Take one tablet in the morning and at noon. Take two tablet at night, Disp: , Rfl:  .  lisinopril  (PRINIVIL,ZESTRIL) 20 MG tablet, Take 1 tablet (20 mg total) by mouth daily. NEED OV., Disp: 90 tablet, Rfl: 0 .  pantoprazole (PROTONIX) 40 MG tablet, Take 1 tablet (40 mg total) by mouth daily., Disp: 90 tablet, Rfl: 1 .  Polyethyl Glycol-Propyl Glycol (SYSTANE ULTRA PF) 0.4-0.3 % SOLN, Place 1 drop into both eyes 2 (two) times daily. , Disp: , Rfl:  .  polyethylene glycol (MIRALAX / GLYCOLAX) packet, Take 17 g by mouth daily as needed. , Disp: , Rfl:  .  potassium chloride (MICRO-K) 10 MEQ CR capsule, Take 1 capsule (10 mEq total) by mouth daily., Disp: 90 capsule, Rfl: 0 .  Probiotic Product (ALIGN) 4 MG CAPS, Take by mouth. Once daily, Disp: , Rfl:  .  simvastatin (ZOCOR) 20 MG tablet, Take 1 tablet (20 mg total) by mouth at bedtime., Disp: 90 tablet, Rfl: 0 .  traMADol (ULTRAM) 50 MG tablet, Take 1 tablet (50 mg total) by mouth every 6 (six) hours as needed., Disp: 15 tablet, Rfl: 0 .  UNKNOWN TO PATIENT, 12-26-14  PT. GIVEN MEDICATION LIST TO TAKE HOME AND COMPARE WITH MEDICATIONS SHE HAS, AND CALL us WITH ANY DIFFERENCES., Disp: , Rfl:  .  vitamin C (ASCORBIC ACID) 500 MG tablet, Take 500 mg by mouth daily., Disp: , Rfl:  .  vitamin E 400 UNIT capsule, Take 400 Units by mouth daily., Disp: , Rfl:   Allergies:  Allergies  Allergen Reactions  . Zyrtec [Cetirizine Hcl] Hives  . Cetirizine Hives    Past Medical History, Surgical history, Social history, and Family History were reviewed and updated.  Review of Systems: As above  Physical Exam:  height is 5\' 2"  (1.575 m). Her oral temperature is 97.5 F (36.4 C). Her blood pressure is 150/73 and her pulse is 95. Her respiration is 16.   Thin, elderly white female. Head and neck exam shows no ocular or oral lesions. She has no palpable cervical or supraclavicular lymph nodes. Lungs are clear. Cardiac exam regular rate and rhythm. Abdomen is soft. She has good bowel sounds. There is no fluid wave. There is no palpable liver or spleen. Back  exam no tenderness over the spine.she has some kyphosis. Extremities shows a wrapping on the left leg. There is some slight edema bilaterally. She has some stasis dermatitis type changes over on the lower legs bilaterally. Neurological exam is nonfocal. Skin exam shows no rashes. This still a little bit of erythema about the left lower leg near the ankle. Lab Results  Component Value Date   WBC 8.1 08/09/2015   HGB 12.8 08/09/2015   HCT 39.8 08/09/2015   MCV 100 08/09/2015   PLT 210 08/09/2015     Chemistry      Component Value Date/Time   NA 140 12/26/2014 1140   K 4.4 12/26/2014 1140   CL 101 12/26/2014 1140   CO2 28 12/26/2014 1140   BUN 29* 12/26/2014 1140   CREATININE 1.05 12/26/2014 1140   CREATININE 0.91 09/19/2013 1226      Component Value Date/Time   CALCIUM 9.3 12/26/2014 1140   ALKPHOS 55 12/26/2014 1140   AST 24 12/26/2014 1140   ALT 12 12/26/2014 1140   BILITOT 0.3 12/26/2014 1140         Impression and Plan: [name] is 81 year old white female. She has an  IgG Kappa MGUS. We have been following her for this for a few years. This has never been shown to be a problem. Her MGUS level has been stable.   Her hemoglobin is much better. We gave her iron back in August. At that point in time, her ferritin was 12.   I feel better that she has the fracture with the left ankle. Hope for, which goes back to see [place], she will not be wearing a walking boot.   I am not sure as to what else we can help her with with regards to the vascular issues with her legs. She does see a vascular surgeon.   I think we can probably get her back to see [place] in another 6 months.   Volanda Napoleon, MD 12/16/20162:05 PM

## 2015-08-12 LAB — IRON AND TIBC
%SAT: 38 % (ref 21–57)
Iron: 104 ug/dL (ref 41–142)
TIBC: 278 ug/dL (ref 236–444)
UIBC: 173 ug/dL (ref 120–384)

## 2015-08-12 LAB — FERRITIN: FERRITIN: 59 ng/mL (ref 9–269)

## 2015-08-13 LAB — IGG, IGA, IGM
IGA: 293 mg/dL (ref 69–380)
IGG (IMMUNOGLOBIN G), SERUM: 955 mg/dL (ref 690–1700)
IGM, SERUM: 307 mg/dL (ref 52–322)

## 2015-08-13 LAB — KAPPA/LAMBDA LIGHT CHAINS
KAPPA LAMBDA RATIO: 1.31 (ref 0.26–1.65)
Kappa free light chain: 3.44 mg/dL — ABNORMAL HIGH (ref 0.33–1.94)
Lambda Free Lght Chn: 2.62 mg/dL (ref 0.57–2.63)

## 2015-08-13 LAB — RETICULOCYTES
ABS Retic: 64.2 10*3/uL (ref 19.0–186.0)
RBC.: 4.01 MIL/uL (ref 3.87–5.11)
RETIC CT PCT: 1.6 % (ref 0.4–2.3)

## 2015-08-13 NOTE — Telephone Encounter (Signed)
REFILL 

## 2015-08-14 ENCOUNTER — Encounter: Payer: Self-pay | Admitting: *Deleted

## 2015-08-27 NOTE — Progress Notes (Signed)
HPI: FU aortic insufficiency. Patient apparently was told some years ago that she may require surgery but feels as though she has had discrepant information. Echo 3/15 showed normal LV function, grade 2 diastolic dysfunction, mild AI, moderate TR. Since she was last seen,   Current Outpatient Prescriptions  Medication Sig Dispense Refill  . aspirin 500 MG EC tablet Take 1,000 mg by mouth at bedtime as needed. For pain    . BUTRANS 5 MCG/HR PTWK patch     . calcium citrate-vitamin D (CITRACAL+D) 315-200 MG-UNIT per tablet Take 1 tablet by mouth daily.     . Cholecalciferol (VITAMIN D) 2000 UNITS CAPS Take 5,000 Units by mouth daily.     . COD LIVER OIL PO Take 1 capsule by mouth daily as needed.     . furosemide (LASIX) 20 MG tablet Take 1 tablet (20 mg total) by mouth daily. 90 tablet 1  . gabapentin (NEURONTIN) 300 MG capsule Take 300-600 mg by mouth 3 (three) times daily. Take one tablet in the morning and at noon. Take two tablet at night    . lisinopril (PRINIVIL,ZESTRIL) 20 MG tablet take 1 tablet by mouth once daily 90 tablet 0  . pantoprazole (PROTONIX) 40 MG tablet Take 1 tablet (40 mg total) by mouth daily. 90 tablet 1  . Polyethyl Glycol-Propyl Glycol (SYSTANE ULTRA PF) 0.4-0.3 % SOLN Place 1 drop into both eyes 2 (two) times daily.     . polyethylene glycol (MIRALAX / GLYCOLAX) packet Take 17 g by mouth daily as needed.     . potassium chloride (MICRO-K) 10 MEQ CR capsule Take 1 capsule (10 mEq total) by mouth daily. 90 capsule 0  . Probiotic Product (ALIGN) 4 MG CAPS Take by mouth. Once daily    . simvastatin (ZOCOR) 20 MG tablet Take 1 tablet (20 mg total) by mouth at bedtime. 90 tablet 0  . traMADol (ULTRAM) 50 MG tablet Take 1 tablet (50 mg total) by mouth every 6 (six) hours as needed. 15 tablet 0  . UNKNOWN TO PATIENT 12-26-14  PT. GIVEN MEDICATION LIST TO TAKE HOME AND COMPARE WITH MEDICATIONS SHE HAS, AND CALL us WITH ANY DIFFERENCES.    Marland Kitchen vitamin C (ASCORBIC ACID) 500  MG tablet Take 500 mg by mouth daily.    . vitamin E 400 UNIT capsule Take 400 Units by mouth daily.     No current facility-administered medications for this visit.     Past Medical History  Diagnosis Date  . Hypertension   . High cholesterol   . Mitral valve disorder   . Diverticulosis   . Blood dyscrasia     IGg  kappa  . GERD (gastroesophageal reflux disease)   . Arthritis     "bad; probably all over; lower back; hands, not RA" (06/23/2012)  . Osteoarthritis   . Fall at home     "fell in the bathtub yesterday; bruised my left arm" (06/23/2012)  . Aortic insufficiency   . Varicose veins     Past Surgical History  Procedure Laterality Date  . Laparoscopy  08/25/2011    Procedure: LAPAROSCOPY DIAGNOSTIC;  Surgeon: Judieth Keens, DO;  Location: [facility];  Service: General;  Laterality: N/A;  reduction obturator hernia obturator hernia repair  . Patella fracture surgery  2011    right  . Total shoulder arthroplasty  06/23/2012    left  . Vaginal hysterectomy  ~ 1996  . Hernia repair    . Cataract  extraction w/ intraocular lens  implant, bilateral  ~ 2003  . Total shoulder arthroplasty  06/23/2012    Procedure: TOTAL SHOULDER ARTHROPLASTY;  Surgeon: Marin Shutter, MD;  Location: [facility];  Service: Orthopedics;  Laterality: Left;  left total shoulder arthroplasty    Social History   Social History  . Marital Status: Single    Spouse Name: N/A  . Number of Children: 3  . Years of Education: N/A   Occupational History  .     Social History Main Topics  . Smoking status: Never Smoker   . Smokeless tobacco: Never Used     Comment: never used tobacco  . Alcohol Use: No     Comment: Rare  . Drug Use: No  . Sexual Activity: Not Currently   Other Topics Concern  . Not on file   Social History Narrative    ROS: no fevers or chills, productive cough, hemoptysis, dysphasia, odynophagia, melena, hematochezia, dysuria, hematuria, rash, seizure activity, orthopnea,  PND, pedal edema, claudication. Remaining systems are negative.  Physical Exam: Well-developed well-nourished in no acute distress.  Skin is warm and dry.  HEENT is normal.  Neck is supple.  Chest is clear to auscultation with normal expansion.  Cardiovascular exam is regular rate and rhythm.  Abdominal exam nontender or distended. No masses palpated. Extremities show no edema. neuro grossly intact  ECG     This encounter was created in error - please disregard.

## 2015-08-28 ENCOUNTER — Encounter: Payer: Medicare Other | Admitting: Cardiology

## 2015-09-12 ENCOUNTER — Encounter: Payer: Self-pay | Admitting: Neurology

## 2015-09-12 ENCOUNTER — Ambulatory Visit: Payer: Medicare Other | Admitting: Neurology

## 2015-09-23 ENCOUNTER — Telehealth: Payer: Self-pay | Admitting: Neurology

## 2015-09-23 NOTE — Telephone Encounter (Signed)
Pt called said daughter will be bringing her to appts in the future. Pt can not drive. She called and apologized for missing appt.

## 2015-09-23 NOTE — Telephone Encounter (Signed)
noted/fim 

## 2015-12-02 ENCOUNTER — Other Ambulatory Visit: Payer: Self-pay | Admitting: *Deleted

## 2015-12-02 MED ORDER — LISINOPRIL 20 MG PO TABS: 20.0000 mg | ORAL_TABLET | Freq: Every day | ORAL | Status: AC

## 2016-02-07 ENCOUNTER — Ambulatory Visit (HOSPITAL_BASED_OUTPATIENT_CLINIC_OR_DEPARTMENT_OTHER): Payer: Medicare Other | Admitting: Hematology & Oncology

## 2016-02-07 ENCOUNTER — Encounter: Payer: Self-pay | Admitting: Hematology & Oncology

## 2016-02-07 ENCOUNTER — Other Ambulatory Visit (HOSPITAL_BASED_OUTPATIENT_CLINIC_OR_DEPARTMENT_OTHER): Payer: Medicare Other

## 2016-02-07 VITALS — BP 137/65 | HR 66 | Temp 98.3°F | Resp 18 | Ht 62.0 in | Wt 120.0 lb

## 2016-02-07 DIAGNOSIS — D500 Iron deficiency anemia secondary to blood loss (chronic): Secondary | ICD-10-CM

## 2016-02-07 DIAGNOSIS — D509 Iron deficiency anemia, unspecified: Secondary | ICD-10-CM

## 2016-02-07 DIAGNOSIS — I351 Nonrheumatic aortic (valve) insufficiency: Secondary | ICD-10-CM

## 2016-02-07 DIAGNOSIS — D472 Monoclonal gammopathy: Secondary | ICD-10-CM

## 2016-02-07 DIAGNOSIS — D508 Other iron deficiency anemias: Secondary | ICD-10-CM

## 2016-02-07 LAB — CBC WITH DIFFERENTIAL (CANCER CENTER ONLY)
BASO#: 0.1 10*3/uL (ref 0.0–0.2)
BASO%: 0.6 % (ref 0.0–2.0)
EOS%: 1.9 % (ref 0.0–7.0)
Eosinophils Absolute: 0.2 10*3/uL (ref 0.0–0.5)
HCT: 36.4 % (ref 34.8–46.6)
HGB: 12.2 g/dL (ref 11.6–15.9)
LYMPH#: 1.5 10*3/uL (ref 0.9–3.3)
LYMPH%: 16.7 % (ref 14.0–48.0)
MCH: 33.2 pg (ref 26.0–34.0)
MCHC: 33.5 g/dL (ref 32.0–36.0)
MCV: 99 fL (ref 81–101)
MONO#: 0.9 10*3/uL (ref 0.1–0.9)
MONO%: 10.2 % (ref 0.0–13.0)
NEUT#: 6.3 10*3/uL (ref 1.5–6.5)
NEUT%: 70.6 % (ref 39.6–80.0)
PLATELETS: 223 10*3/uL (ref 145–400)
RBC: 3.68 10*6/uL — AB (ref 3.70–5.32)
RDW: 13.4 % (ref 11.1–15.7)
WBC: 8.9 10*3/uL (ref 3.9–10.0)

## 2016-02-07 LAB — COMPREHENSIVE METABOLIC PANEL (CC13)
ALT: 10 IU/L (ref 0–32)
AST (SGOT): 20 IU/L (ref 0–40)
Albumin, Serum: 4.2 g/dL (ref 3.5–4.7)
Albumin/Globulin Ratio: 1.4 (ref 1.2–2.2)
Alkaline Phosphatase, S: 59 IU/L (ref 39–117)
BUN/Creatinine Ratio: 41 — ABNORMAL HIGH (ref 12–28)
BUN: 38 mg/dL — AB (ref 8–27)
Bilirubin Total: 0.2 mg/dL (ref 0.0–1.2)
CALCIUM: 9.8 mg/dL (ref 8.7–10.3)
CO2: 26 mmol/L (ref 18–29)
CREATININE: 0.93 mg/dL (ref 0.57–1.00)
Chloride, Ser: 97 mmol/L (ref 96–106)
GFR calc Af Amer: 65 mL/min/{1.73_m2} (ref >59)
GFR, EST NON AFRICAN AMERICAN: 56 mL/min/{1.73_m2} — AB (ref >59)
GLUCOSE: 99 mg/dL (ref 65–99)
Globulin, Total: 3 g/dL (ref 1.5–4.5)
Potassium, Ser: 4.1 mmol/L (ref 3.5–5.2)
Sodium: 132 mmol/L — ABNORMAL LOW (ref 134–144)
TOTAL PROTEIN: 7.2 g/dL (ref 6.0–8.5)

## 2016-02-07 NOTE — Progress Notes (Signed)
[facility] Hematology and Oncology Follow Up Visit  [name] [id number] 09-15-1928 82 y.o. 02/07/2016   Principle Diagnosis:  IgG Kappa MGUS Intermittent iron deficiency anemia  Current Therapy:    Observation     Interim History:  [name] is back for followup. The big story is that she was hospitalized around Christmas time with a TIA. This was in [place]. She had a workup. Lives like she may have had atrial fibrillation. She's not felt to be a candidate for anticoagulation because of her being a fall risk. She is on Plavix and baby aspirin.  Definitely has affected her memory. She does not seem to have much in the way of short-term memory.  As far as her MGUS is concerned, this is not been a factor. Back in December, her IgG level was 9 55 mg/dL. Her Kappa Lightchain was 3.44 mg/dL. I'm not sure if she had a M spike.  We have given her iron in the past. She had iron back in August of last year. In December of last her, her ferritin was 59 with an iron saturation 38%.  She does not appear to be having any problems with her ankles. She does not even remember about hurting herself and I think breaking her ankle.    Overall, her performance status is ECOG 1.   Medications:  Current outpatient prescriptions:  .  albuterol (PROAIR HFA) 108 (90 Base) MCG/ACT inhaler, Inhale into the lungs., Disp: , Rfl:  .  atorvastatin (LIPITOR) 20 MG tablet, Take by mouth., Disp: , Rfl:  .  beta carotene 25000 UNIT capsule, Take by mouth., Disp: , Rfl:  .  BUTRANS 5 MCG/HR PTWK patch, , Disp: , Rfl:  .  clopidogrel (PLAVIX) 75 MG tablet, Take 75 mg by mouth., Disp: , Rfl:  .  fluticasone (FLONASE) 50 MCG/ACT nasal spray, Place into the nose., Disp: , Rfl:  .  furosemide (LASIX) 20 MG tablet, Take 1 tablet (20 mg total) by mouth daily., Disp: 90 tablet, Rfl: 1 .  gabapentin (NEURONTIN) 300 MG capsule, Take 300-600 mg by mouth 3 (three) times daily. Take one tablet in the morning and at noon. Take  two tablet at night, Disp: , Rfl:  .  hydrochlorothiazide (MICROZIDE) 12.5 MG capsule, Take by mouth., Disp: , Rfl:  .  lisinopril (PRINIVIL,ZESTRIL) 20 MG tablet, Take 1 tablet (20 mg total) by mouth daily. NEED OV., Disp: 90 tablet, Rfl: 0 .  metoprolol tartrate (LOPRESSOR) 25 MG tablet, Take by mouth., Disp: , Rfl:  .  Multiple Vitamin (MULTI-VITAMINS) TABS, Take by mouth., Disp: , Rfl:  .  pantoprazole (PROTONIX) 40 MG tablet, Take 1 tablet (40 mg total) by mouth daily., Disp: 90 tablet, Rfl: 1 .  Polyethyl Glycol-Propyl Glycol (SYSTANE ULTRA PF) 0.4-0.3 % SOLN, Place 1 drop into both eyes 2 (two) times daily. , Disp: , Rfl:  .  polyethylene glycol (MIRALAX / GLYCOLAX) packet, Take 17 g by mouth daily as needed. , Disp: , Rfl:  .  potassium chloride (MICRO-K) 10 MEQ CR capsule, Take 1 capsule (10 mEq total) by mouth daily., Disp: 90 capsule, Rfl: 0 .  risperiDONE (RISPERDAL) 0.25 MG tablet, Take by mouth., Disp: , Rfl:  .  sodium chloride (OCEAN) 0.65 % nasal spray, Place into the nose., Disp: , Rfl:  .  traMADol (ULTRAM) 50 MG tablet, Take 1 tablet (50 mg total) by mouth every 6 (six) hours as needed., Disp: 15 tablet, Rfl: 0 .  UNKNOWN TO PATIENT,  12-26-14  PT. GIVEN MEDICATION LIST TO TAKE HOME AND COMPARE WITH MEDICATIONS SHE HAS, AND CALL us WITH ANY DIFFERENCES., Disp: , Rfl:   Allergies:  Allergies  Allergen Reactions  . Zyrtec [Cetirizine Hcl] Hives  . Cetirizine Hives    Past Medical History, Surgical history, Social history, and Family History were reviewed and updated.  Review of Systems: As above  Physical Exam:  height is 5\' 2"  (1.575 m) and weight is 120 lb (54.432 kg). Her oral temperature is 98.3 F (36.8 C). Her blood pressure is 137/65 and her pulse is 66. Her respiration is 18.   Thin, elderly white female. Head and neck exam shows no ocular or oral lesions. She has no palpable cervical or supraclavicular lymph nodes. Lungs are clear. Cardiac exam regular rate and  rhythm. Abdomen is soft. She has good bowel sounds. There is no fluid wave. There is no palpable liver or spleen. Back exam no tenderness over the spine.she has some kyphosis. Extremities shows a wrapping on the left leg. There is some slight edema bilaterally. She has some stasis dermatitis type changes over on the lower legs bilaterally. Neurological exam is nonfocal. Skin exam shows no rashes. This still a little bit of erythema about the left lower leg near the ankle. Lab Results  Component Value Date   WBC 8.9 02/07/2016   HGB 12.2 02/07/2016   HCT 36.4 02/07/2016   MCV 99 02/07/2016   PLT 223 02/07/2016     Chemistry      Component Value Date/Time   NA 132* 02/07/2016 1445   NA 142 08/09/2015 1311   NA 140 12/26/2014 1140   K 4.1 02/07/2016 1445   K 3.6 08/09/2015 1311   K 4.4 12/26/2014 1140   CL 97 02/07/2016 1445   CL 101 12/26/2014 1140   CO2 26 02/07/2016 1445   CO2 27 08/09/2015 1311   CO2 28 12/26/2014 1140   BUN 38* 02/07/2016 1445   BUN 21.0 08/09/2015 1311   BUN 29* 12/26/2014 1140   CREATININE 0.93 02/07/2016 1445   CREATININE 0.9 08/09/2015 1311   CREATININE 1.05 12/26/2014 1140   CREATININE 0.91 09/19/2013 1226      Component Value Date/Time   CALCIUM 9.8 02/07/2016 1445   CALCIUM 9.5 08/09/2015 1311   CALCIUM 9.3 12/26/2014 1140   ALKPHOS 59 02/07/2016 1445   ALKPHOS 71 08/09/2015 1311   ALKPHOS 55 12/26/2014 1140   AST 20 02/07/2016 1445   AST 23 08/09/2015 1311   AST 24 12/26/2014 1140   ALT 10 02/07/2016 1445   ALT 13 08/09/2015 1311   ALT 12 12/26/2014 1140   BILITOT 0.2 02/07/2016 1445   BILITOT <0.30 08/09/2015 1311   BILITOT 0.3 12/26/2014 1140         Impression and Plan: [name] is 82 year old white female. She has an  IgG Kappa MGUS. We have been following her for this for a few years. This has never been shown to be a problem. Her MGUS level has been stable.  [name] that she had this TIA. It seems to be affecting her. Her  memory certainly is not as sharp.  I'm still not sure as to exactly what happened. From the records that I saw, looks that she may have had some atrial fibrillation. She now is on Plavix and baby aspirin.   I still think we probably get her back in 6 months. I would be very surprised if there are any issues with her blood.  Volanda Napoleon, MD 6/16/20176:04 PM

## 2016-02-08 LAB — IGG, IGA, IGM
IgA, Qn, Serum: 241 mg/dL (ref 64–422)
IgG, Qn, Serum: 865 mg/dL (ref 700–1600)
IgM, Qn, Serum: 290 mg/dL — ABNORMAL HIGH (ref 26–217)

## 2016-02-10 LAB — KAPPA/LAMBDA LIGHT CHAINS
IG KAPPA FREE LIGHT CHAIN: 37.4 mg/L — AB (ref 3.3–19.4)
IG LAMBDA FREE LIGHT CHAIN: 27.3 mg/L — AB (ref 5.7–26.3)
Kappa/Lambda FluidC Ratio: 1.37 (ref 0.26–1.65)

## 2016-02-10 LAB — IRON AND TIBC
%SAT: 25 % (ref 21–57)
Iron: 76 ug/dL (ref 41–142)
TIBC: 306 ug/dL (ref 236–444)
UIBC: 230 ug/dL (ref 120–384)

## 2016-02-10 LAB — FERRITIN: Ferritin: 51 ng/ml (ref 9–269)

## 2016-02-11 LAB — PROTEIN ELECTROPHORESIS, SERUM, WITH REFLEX
A/G Ratio: 1.2 (ref 0.7–1.7)
ALPHA 1: 0.3 g/dL (ref 0.0–0.4)
Albumin: 3.7 g/dL (ref 2.9–4.4)
Alpha 2: 0.9 g/dL (ref 0.4–1.0)
Beta: 1 g/dL (ref 0.7–1.3)
GLOBULIN, TOTAL: 3.2 g/dL (ref 2.2–3.9)
Gamma Globulin: 1.1 g/dL (ref 0.4–1.8)
INTERPRETATION(SEE BELOW): 0
M-SPIKE, %: 0.3 g/dL — AB
Total Protein: 6.9 g/dL (ref 6.0–8.5)

## 2016-03-22 IMAGING — DX DG ANKLE COMPLETE 3+V*L*
3 series · 3 of 3 positions shown · non-contrast
Comparison: None.

CLINICAL DATA: Fell 2 days ago.  Right thigh and left ankle pain.

EXAM:
LEFT ANKLE COMPLETE - 3+ VIEW; RIGHT FEMUR 2 VIEWS

[ankle ap]
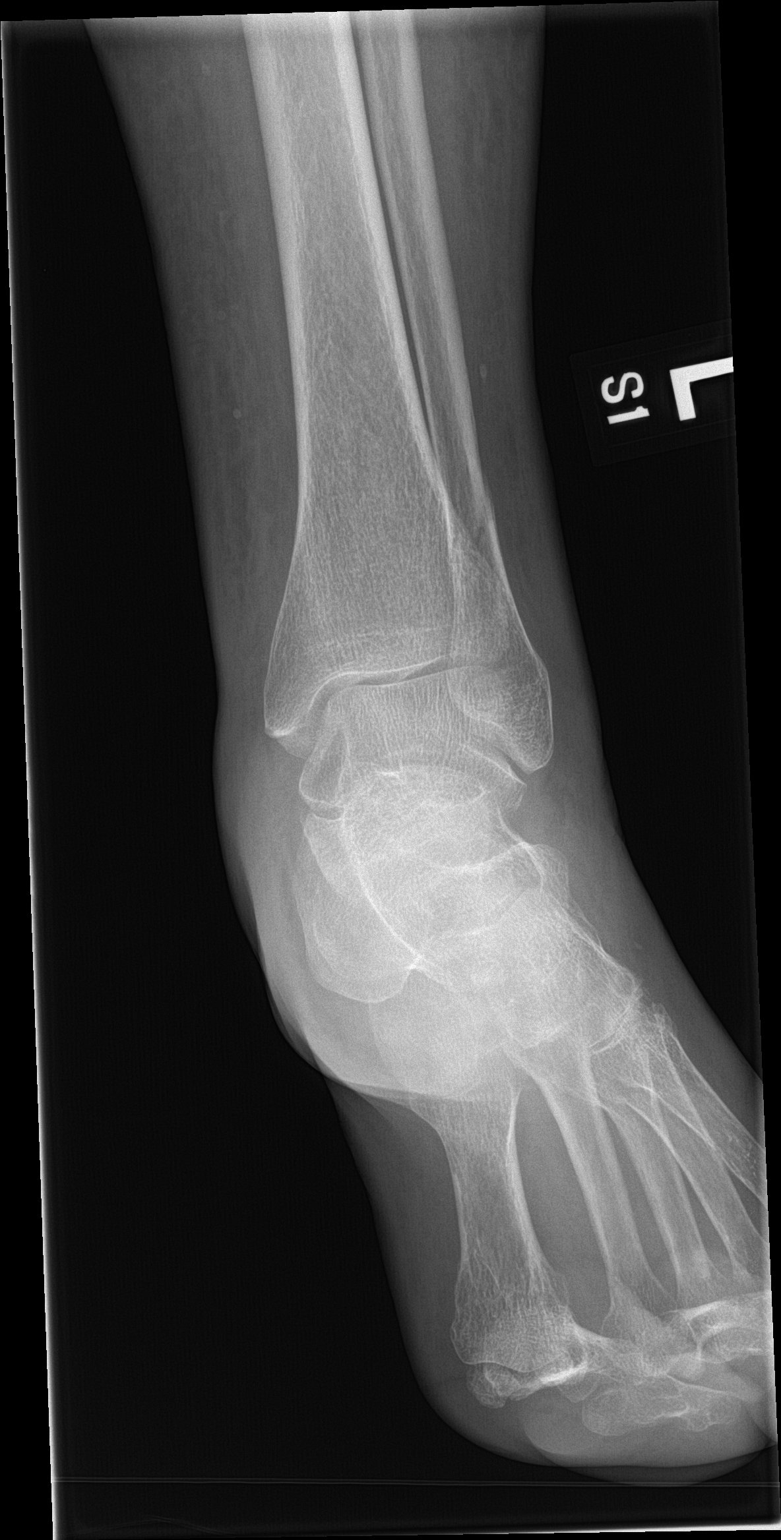

[ankle obl]
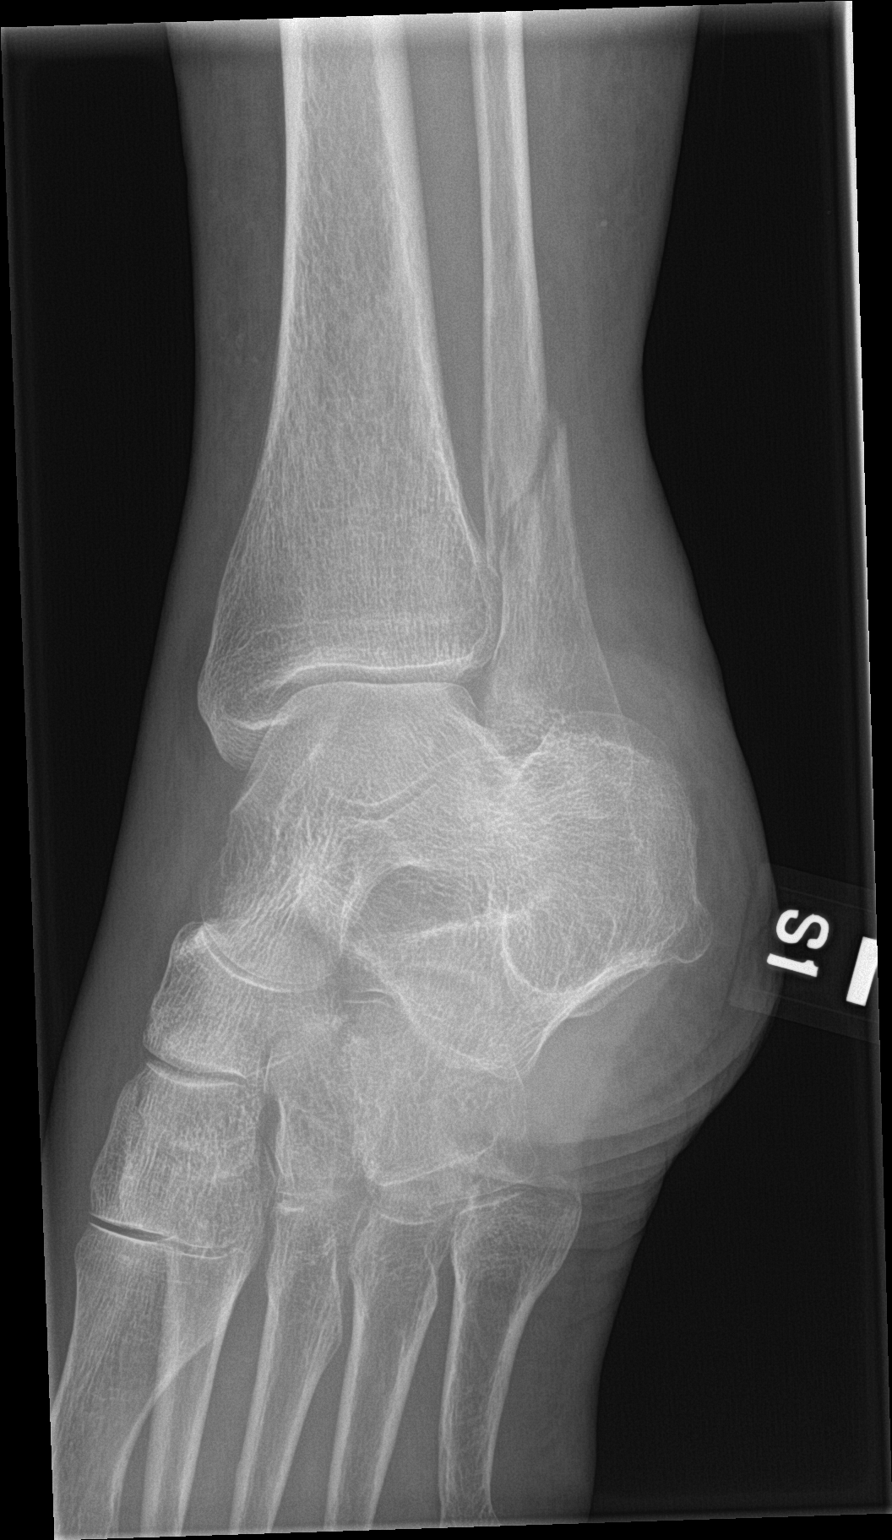

[ankle lat]
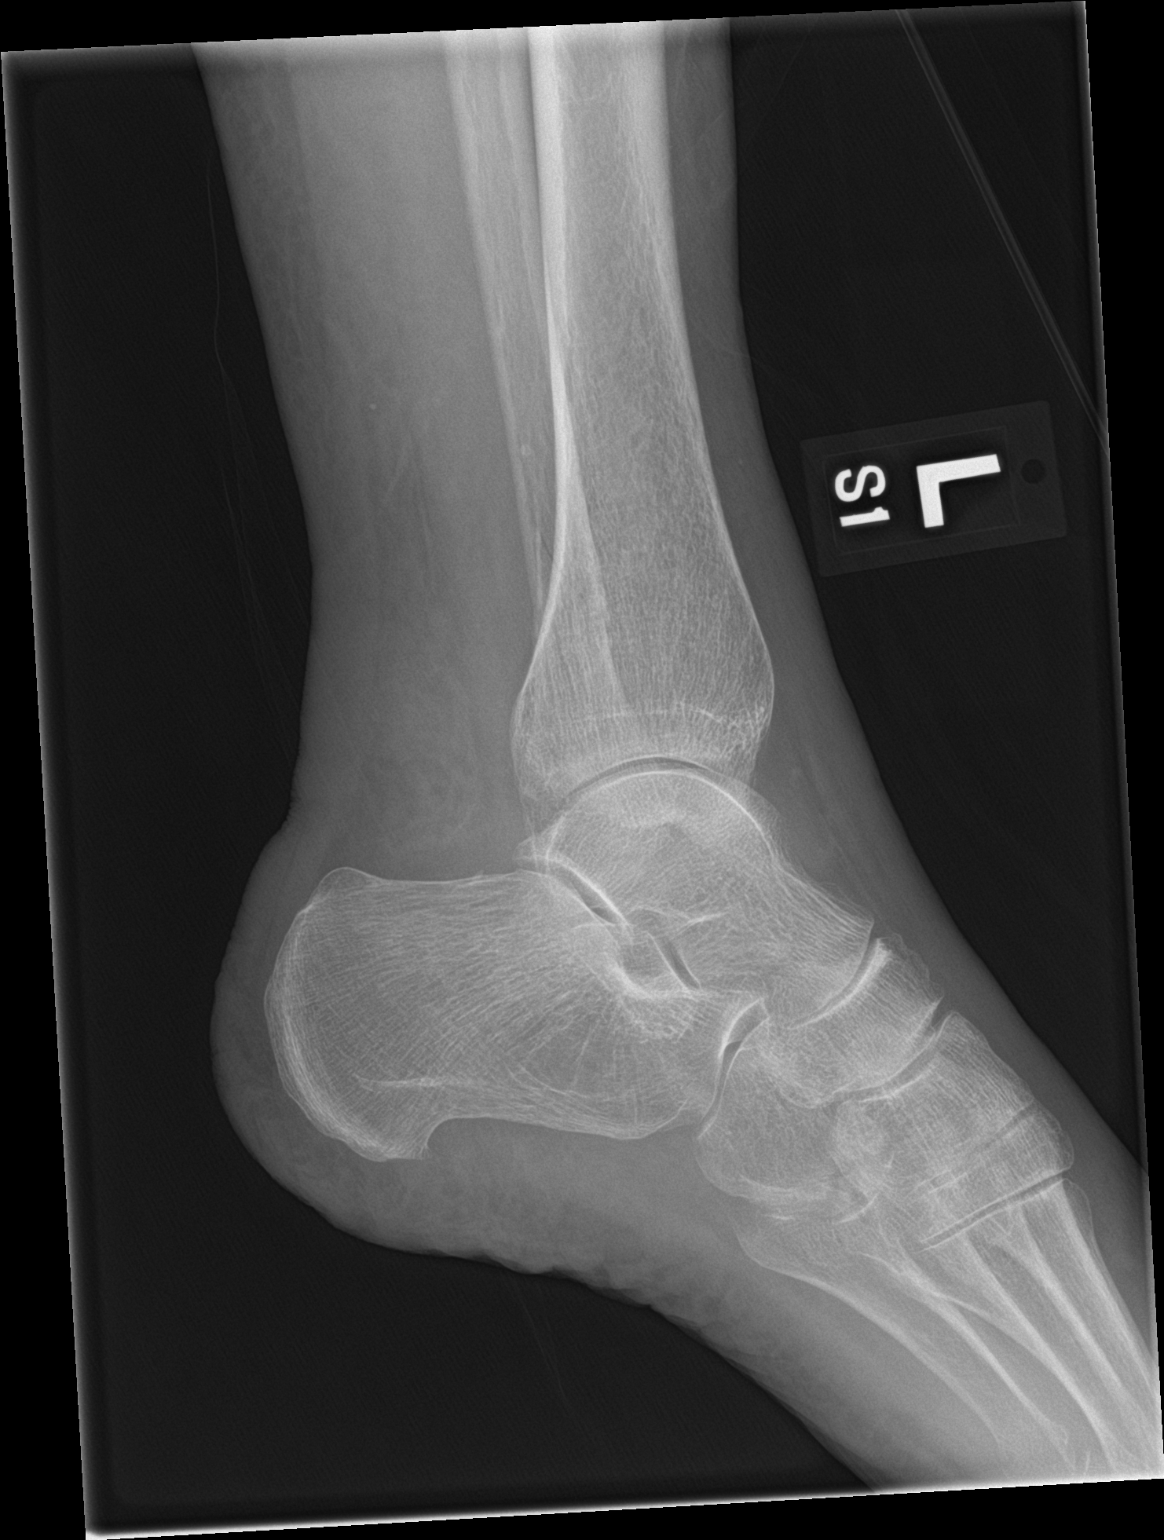

[3 of 3 positions shown; findings below may reference images not displayed]

FINDINGS: Right femur:

The right hip is normally located. Remote right-sided pubic rami
fractures are noted. No acute femur fracture. Orthopedic hardware
noted fixing a remote patellar fracture. No complicating features.
The knee joint is maintained. Vascular calcifications are noted.

Left ankle:

There is an oblique coursing nondisplaced fracture involving the
distal fibular shaft above the level of the ankle mortise. The tibia
is intact. The talus is intact. The mid and hindfoot bony structures
are intact.
IMPRESSION: 1. Nondisplaced distal left fibular shaft fracture.
2. No right femur fracture.

## 2016-07-30 ENCOUNTER — Other Ambulatory Visit: Payer: Medicare Other

## 2016-07-30 ENCOUNTER — Ambulatory Visit: Payer: Medicare Other | Admitting: Family

## 2016-08-07 ENCOUNTER — Ambulatory Visit (HOSPITAL_BASED_OUTPATIENT_CLINIC_OR_DEPARTMENT_OTHER): Payer: Medicare Other | Admitting: Family

## 2016-08-07 ENCOUNTER — Other Ambulatory Visit (HOSPITAL_BASED_OUTPATIENT_CLINIC_OR_DEPARTMENT_OTHER): Payer: Medicare Other

## 2016-08-07 VITALS — BP 108/51 | HR 56 | Temp 98.0°F | Resp 16 | Wt 112.5 lb

## 2016-08-07 DIAGNOSIS — M19042 Primary osteoarthritis, left hand: Secondary | ICD-10-CM | POA: Diagnosis not present

## 2016-08-07 DIAGNOSIS — D472 Monoclonal gammopathy: Secondary | ICD-10-CM

## 2016-08-07 DIAGNOSIS — D500 Iron deficiency anemia secondary to blood loss (chronic): Secondary | ICD-10-CM

## 2016-08-07 DIAGNOSIS — I998 Other disorder of circulatory system: Secondary | ICD-10-CM

## 2016-08-07 DIAGNOSIS — M19011 Primary osteoarthritis, right shoulder: Secondary | ICD-10-CM

## 2016-08-07 DIAGNOSIS — M19041 Primary osteoarthritis, right hand: Secondary | ICD-10-CM

## 2016-08-07 DIAGNOSIS — D508 Other iron deficiency anemias: Secondary | ICD-10-CM

## 2016-08-07 DIAGNOSIS — Z862 Personal history of diseases of the blood and blood-forming organs and certain disorders involving the immune mechanism: Secondary | ICD-10-CM

## 2016-08-07 DIAGNOSIS — M19012 Primary osteoarthritis, left shoulder: Secondary | ICD-10-CM | POA: Diagnosis not present

## 2016-08-07 DIAGNOSIS — I351 Nonrheumatic aortic (valve) insufficiency: Secondary | ICD-10-CM

## 2016-08-07 LAB — CBC WITH DIFFERENTIAL (CANCER CENTER ONLY)
BASO#: 0.1 10*3/uL (ref 0.0–0.2)
BASO%: 0.7 % (ref 0.0–2.0)
EOS%: 1.3 % (ref 0.0–7.0)
Eosinophils Absolute: 0.1 10*3/uL (ref 0.0–0.5)
HEMATOCRIT: 38 % (ref 34.8–46.6)
HGB: 12.5 g/dL (ref 11.6–15.9)
LYMPH#: 1.5 10*3/uL (ref 0.9–3.3)
LYMPH%: 15.6 % (ref 14.0–48.0)
MCH: 31.8 pg (ref 26.0–34.0)
MCHC: 32.9 g/dL (ref 32.0–36.0)
MCV: 97 fL (ref 81–101)
MONO#: 1 10*3/uL — ABNORMAL HIGH (ref 0.1–0.9)
MONO%: 10.5 % (ref 0.0–13.0)
NEUT%: 71.9 % (ref 39.6–80.0)
NEUTROS ABS: 7.1 10*3/uL — AB (ref 1.5–6.5)
PLATELETS: 267 10*3/uL (ref 145–400)
RBC: 3.93 10*6/uL (ref 3.70–5.32)
RDW: 13.4 % (ref 11.1–15.7)
WBC: 9.9 10*3/uL (ref 3.9–10.0)

## 2016-08-07 LAB — COMPREHENSIVE METABOLIC PANEL
ALT: 16 U/L (ref 0–55)
ANION GAP: 9 meq/L (ref 3–11)
AST: 27 U/L (ref 5–34)
Albumin: 3.6 g/dL (ref 3.5–5.0)
Alkaline Phosphatase: 84 U/L (ref 40–150)
BUN: 33.2 mg/dL — ABNORMAL HIGH (ref 7.0–26.0)
CALCIUM: 9.8 mg/dL (ref 8.4–10.4)
CHLORIDE: 99 meq/L (ref 98–109)
CO2: 29 meq/L (ref 22–29)
CREATININE: 1 mg/dL (ref 0.6–1.1)
EGFR: 51 mL/min/{1.73_m2} — AB (ref >90)
Glucose: 100 mg/dl (ref 70–140)
Potassium: 4.3 mEq/L (ref 3.5–5.1)
Sodium: 138 mEq/L (ref 136–145)
Total Bilirubin: 0.44 mg/dL (ref 0.20–1.20)
Total Protein: 7.7 g/dL (ref 6.4–8.3)

## 2016-08-07 NOTE — Progress Notes (Signed)
Hematology and Oncology Follow Up Visit  Utha Frede [id number] [birth year] 82 y.o. 08/07/2016   Principle Diagnosis:  IgG Kappa MGUS Intermittent iron deficiency anemia  Current Therapy:   Observation IV iron as indicated - last received in August 2016    Interim History: Ms. Liang is here today for a follow-up. She appears to be doing well. She does states that she has arthritis in her hands and shoulders. She also states that she needs to exercise more and will be starting physical therapy soon.  She states that she has not had another TIA and is still taking her Plavix and aspirin.  So far, her MGUS has not been a problem. Her M-spike was stable at 0.3 g/dL, IgG level 865 mg/dL and kappa light chain was 37.4 mg/L.  She has had no problem with infections. No fever, chills, n/v, cough, rash, dizziness, SOB, chest pain, palpitations, abdominal pain or changes in bowel or bladder habits.  She has chronic peripheral vascular insufficiency with redness and intermittent "puffiness" in both legs. Lower extremities warm and bilateral pedal pulses are +1.  No numbness or tingling in her extremities. No lymphadenopathy found on exam.  Denies falls or syncope.  She has maintained a good appetite but admits that she needs to drink more fluids.  Medications:  Allergies as of 08/07/2016      Reactions   Zyrtec [cetirizine Hcl] Hives   Cetirizine Hives      Medication List       Accurate as of 08/07/16  2:55 PM. Always use your most recent med list.          aspirin 81 MG chewable tablet Chew by mouth daily.   atorvastatin 20 MG tablet Commonly known as:  LIPITOR Take by mouth.   BUTRANS 5 MCG/HR Ptwk patch Generic drug:  buprenorphine   clopidogrel 75 MG tablet Commonly known as:  PLAVIX Take 75 mg by mouth.   fluticasone 50 MCG/ACT nasal spray Commonly known as:  FLONASE Place into the nose.   furosemide 20 MG tablet Commonly known as:  LASIX Take 1 tablet (20 mg  total) by mouth daily.   hydrochlorothiazide 12.5 MG capsule Commonly known as:  MICROZIDE Take by mouth.   lisinopril 20 MG tablet Commonly known as:  PRINIVIL,ZESTRIL Take 1 tablet (20 mg total) by mouth daily. NEED OV.   metoprolol tartrate 25 MG tablet Commonly known as:  LOPRESSOR Take by mouth.   pantoprazole 40 MG tablet Commonly known as:  PROTONIX Take 1 tablet (40 mg total) by mouth daily.   polyethylene glycol packet Commonly known as:  MIRALAX / GLYCOLAX Take 17 g by mouth daily as needed.   potassium chloride 10 MEQ CR capsule Commonly known as:  MICRO-K Take 1 capsule (10 mEq total) by mouth daily.   PROAIR HFA 108 (90 Base) MCG/ACT inhaler Generic drug:  albuterol Inhale into the lungs.   SYSTANE ULTRA PF 0.4-0.3 % Soln Generic drug:  Polyethyl Glycol-Propyl Glycol Place 1 drop into both eyes 2 (two) times daily.   traMADol 50 MG tablet Commonly known as:  ULTRAM Take 1 tablet (50 mg total) by mouth every 6 (six) hours as needed.       Allergies:  Allergies  Allergen Reactions  . Zyrtec [Cetirizine Hcl] Hives  . Cetirizine Hives    Past Medical History, Surgical history, Social history, and Family History were reviewed and updated.  Review of Systems: All other 10 point review of systems is negative.  Physical Exam:  weight is 112 lb 8 oz (51 kg). Her oral temperature is 98 F (36.7 C). Her blood pressure is 108/51 (abnormal) and her pulse is 56 (abnormal). Her respiration is 16 and oxygen saturation is 100%.   Wt Readings from Last 3 Encounters:  08/07/16 112 lb 8 oz (51 kg)  02/07/16 120 lb (54.4 kg)  05/23/15 129 lb (58.5 kg)    Ocular: Sclerae unicteric, pupils equal, round and reactive to light Ear-nose-throat: Oropharynx clear, dentition fair Lymphatic: No cervical supraclavicular or axillary adenopathy Lungs no rales or rhonchi, good excursion bilaterally Heart regular rate and rhythm, no murmur appreciated Abd soft,  nontender, positive bowel sounds, no liver or spleen tip palpated on exam, no fluid wave  MSK no focal spinal tenderness, no joint edema Neuro: non-focal, well-oriented, appropriate affect Breasts: Deferred  Lab Results  Component Value Date   WBC 9.9 08/07/2016   HGB 12.5 08/07/2016   HCT 38.0 08/07/2016   MCV 97 08/07/2016   PLT 267 08/07/2016   Lab Results  Component Value Date   FERRITIN 51 02/07/2016   IRON 76 02/07/2016   TIBC 306 02/07/2016   UIBC 230 02/07/2016   IRONPCTSAT 25 02/07/2016   Lab Results  Component Value Date   RETICCTPCT 1.6 08/09/2015   RBC 3.93 08/07/2016   RETICCTABS 64.2 08/09/2015   Lab Results  Component Value Date   KPAFRELGTCHN 3.44 (H) 08/09/2015   LAMBDASER 2.62 08/09/2015   KAPLAMBRATIO 1.37 02/07/2016   Lab Results  Component Value Date   IGGSERUM 865 02/07/2016   IGA 293 08/09/2015   IGMSERUM 290 (H) 02/07/2016   Lab Results  Component Value Date   TOTALPROTELP 7.0 12/26/2014   ALBUMINELP 3.9 12/26/2014   A1GS 0.4 (H) 12/26/2014   A2GS 0.9 12/26/2014   BETS 0.5 12/26/2014   BETA2SER 0.4 12/26/2014   GAMS 1.0 12/26/2014   MSPIKE 0.3 (H) 02/07/2016   SPEI * 12/26/2014     Chemistry      Component Value Date/Time   NA 132 (L) 02/07/2016 1445   NA 142 08/09/2015 1311   K 4.1 02/07/2016 1445   K 3.6 08/09/2015 1311   CL 97 02/07/2016 1445   CO2 26 02/07/2016 1445   CO2 27 08/09/2015 1311   BUN 38 (H) 02/07/2016 1445   BUN 21.0 08/09/2015 1311   CREATININE 0.93 02/07/2016 1445   CREATININE 0.9 08/09/2015 1311      Component Value Date/Time   CALCIUM 9.8 02/07/2016 1445   CALCIUM 9.5 08/09/2015 1311   ALKPHOS 59 02/07/2016 1445   ALKPHOS 71 08/09/2015 1311   AST 20 02/07/2016 1445   AST 23 08/09/2015 1311   ALT 10 02/07/2016 1445   ALT 13 08/09/2015 1311   BILITOT 0.2 02/07/2016 1445   BILITOT <0.30 08/09/2015 1311     Impression and Plan: Ms. Crissey is 82 yo white female with an IgG Kappa MGUS. So far, her  numbers have been stable and she has not had any problem with this. Studies today are pending. She has had no issue with infections. No anemia.  He complaint at this time is the vascular insufficiency in both lower extremities and arthritis in her hands and shoulders. She is followed closely by her PCP for these issues and will be start PT soon.  We will plan to see her back in 1 year for repeat lab work and follow-up.  Eliezer Bottom, NP 12/15/20172:55 PM

## 2016-08-08 LAB — IGG, IGA, IGM
IgA, Qn, Serum: 319 mg/dL (ref 64–422)
IgG, Qn, Serum: 1003 mg/dL (ref 700–1600)
IgM, Qn, Serum: 408 mg/dL — ABNORMAL HIGH (ref 26–217)

## 2016-08-10 LAB — IRON AND TIBC
%SAT: 27 % (ref 21–57)
Iron: 93 ug/dL (ref 41–142)
TIBC: 350 ug/dL (ref 236–444)
UIBC: 257 ug/dL (ref 120–384)

## 2016-08-10 LAB — FERRITIN: FERRITIN: 47 ng/mL (ref 9–269)

## 2016-08-10 LAB — KAPPA/LAMBDA LIGHT CHAINS
IG LAMBDA FREE LIGHT CHAIN: 33.1 mg/L — AB (ref 5.7–26.3)
Ig Kappa Free Light Chain: 45.4 mg/L — ABNORMAL HIGH (ref 3.3–19.4)
KAPPA/LAMBDA FLC RATIO: 1.37 (ref 0.26–1.65)

## 2016-08-12 LAB — PROTEIN ELECTROPHORESIS, SERUM, WITH REFLEX
A/G RATIO SPE: 1 (ref 0.7–1.7)
ALBUMIN: 3.6 g/dL (ref 2.9–4.4)
ALPHA 1: 0.3 g/dL (ref 0.0–0.4)
Alpha 2: 0.9 g/dL (ref 0.4–1.0)
Beta: 1.1 g/dL (ref 0.7–1.3)
Gamma Globulin: 1.3 g/dL (ref 0.4–1.8)
Globulin, Total: 3.6 g/dL (ref 2.2–3.9)
Interpretation(See Below): 0
M-Spike, %: 0.2 g/dL — ABNORMAL HIGH
TOTAL PROTEIN: 7.2 g/dL (ref 6.0–8.5)

## 2016-08-14 ENCOUNTER — Telehealth: Payer: Self-pay

## 2016-08-14 NOTE — Telephone Encounter (Addendum)
-----   Message from Eliezer Bottom, NP sent at 08/14/2016  9:03 AM EST ----- Regarding: labs Iron and protein studies are stable. Merry Christmas!   Judson Roch  Above message left on personalized VM with instructions to contact the office for questions or concerns. dph

## 2017-04-26 ENCOUNTER — Encounter (HOSPITAL_BASED_OUTPATIENT_CLINIC_OR_DEPARTMENT_OTHER): Payer: Self-pay | Admitting: *Deleted

## 2017-04-26 ENCOUNTER — Emergency Department (HOSPITAL_BASED_OUTPATIENT_CLINIC_OR_DEPARTMENT_OTHER)
Admission: EM | Admit: 2017-04-26 | Discharge: 2017-04-26 | Disposition: A | Discharge status: Home / Self Care | Payer: Medicare Other | Attending: Emergency Medicine | Admitting: Emergency Medicine

## 2017-04-26 DIAGNOSIS — R300 Dysuria: Secondary | ICD-10-CM | POA: Diagnosis present

## 2017-04-26 DIAGNOSIS — I10 Essential (primary) hypertension: Secondary | ICD-10-CM | POA: Insufficient documentation

## 2017-04-26 LAB — CBC WITH DIFFERENTIAL/PLATELET
BASOS ABS: 0 10*3/uL (ref 0.0–0.1)
Basophils Relative: 0 %
EOS PCT: 1 %
Eosinophils Absolute: 0.1 10*3/uL (ref 0.0–0.7)
HCT: 32.5 % — ABNORMAL LOW (ref 36.0–46.0)
Hemoglobin: 10.6 g/dL — ABNORMAL LOW (ref 12.0–15.0)
LYMPHS PCT: 9 %
Lymphs Abs: 1.1 10*3/uL (ref 0.7–4.0)
MCH: 31.7 pg (ref 26.0–34.0)
MCHC: 32.6 g/dL (ref 30.0–36.0)
MCV: 97.3 fL (ref 78.0–100.0)
MONO ABS: 1.2 10*3/uL — AB (ref 0.1–1.0)
Monocytes Relative: 10 %
Neutro Abs: 9.5 10*3/uL — ABNORMAL HIGH (ref 1.7–7.7)
Neutrophils Relative %: 80 %
PLATELETS: 280 10*3/uL (ref 150–400)
RBC: 3.34 MIL/uL — ABNORMAL LOW (ref 3.87–5.11)
RDW: 13.5 % (ref 11.5–15.5)
WBC: 11.9 10*3/uL — ABNORMAL HIGH (ref 4.0–10.5)

## 2017-04-26 LAB — URINALYSIS, MICROSCOPIC (REFLEX)

## 2017-04-26 LAB — COMPREHENSIVE METABOLIC PANEL
ALK PHOS: 64 U/L (ref 38–126)
ALT: 10 U/L — AB (ref 14–54)
AST: 20 U/L (ref 15–41)
Albumin: 3.4 g/dL — ABNORMAL LOW (ref 3.5–5.0)
Anion gap: 9 (ref 5–15)
BILIRUBIN TOTAL: 0.5 mg/dL (ref 0.3–1.2)
BUN: 21 mg/dL — AB (ref 6–20)
CO2: 30 mmol/L (ref 22–32)
Calcium: 9.2 mg/dL (ref 8.9–10.3)
Chloride: 95 mmol/L — ABNORMAL LOW (ref 101–111)
Creatinine, Ser: 0.95 mg/dL (ref 0.44–1.00)
GFR calc Af Amer: >60 mL/min (ref >60)
GFR calc non Af Amer: 53 mL/min — ABNORMAL LOW (ref >60)
GLUCOSE: 103 mg/dL — AB (ref 65–99)
POTASSIUM: 4.1 mmol/L (ref 3.5–5.1)
Sodium: 134 mmol/L — ABNORMAL LOW (ref 135–145)
TOTAL PROTEIN: 6.9 g/dL (ref 6.5–8.1)

## 2017-04-26 LAB — URINALYSIS, ROUTINE W REFLEX MICROSCOPIC
Bilirubin Urine: NEGATIVE
GLUCOSE, UA: NEGATIVE mg/dL
Ketones, ur: NEGATIVE mg/dL
Nitrite: NEGATIVE
Protein, ur: NEGATIVE mg/dL
Specific Gravity, Urine: 1.01 (ref 1.005–1.030)
pH: 7.5 (ref 5.0–8.0)

## 2017-04-26 MED ORDER — CEPHALEXIN 500 MG PO CAPS: 500.0000 mg | ORAL_CAPSULE | Freq: Two times a day (BID) | ORAL | 0 refills | Status: AC

## 2017-04-26 NOTE — ED Triage Notes (Signed)
Pt c/o painful urination x 2 days

## 2017-04-26 NOTE — Discharge Instructions (Signed)
You have been seen in the Emergency Department (ED) today for pain when urinating.  Your workup today suggests that you may have a developing urinary tract infection (UTI).  Please take your antibiotic as prescribed and over-the-counter pain medication (Tylenol) as needed, but no more than recommended on the label instructions.  Drink PLENTY of fluids.  Call your regular doctor to schedule the next available appointment to follow up on today?s ED visit, or return immediately to the ED if your pain worsens, you have decreased urine production, develop fever, persistent vomiting, or other symptoms that concern you.

## 2017-04-26 NOTE — ED Notes (Signed)
Patient stated that when she pee's it hurts.

## 2017-04-26 NOTE — ED Provider Notes (Signed)
Emergency Department Provider Note   I have reviewed the triage vital signs and the nursing notes.   HISTORY  Chief Complaint Dysuria   HPI [name] is a 83 y.o. female with PMH of HLD, HTN, and GERD presents to the emergency department for evaluation of dysuria and mild lower abdominal discomfort. Symptoms been ongoing for the past 2-3 days. Patient reports pain only with urination. No fevers or chills. No vaginal discharge. No recent antibiotics or infections. Patient lives at home with her son who assists with her care. Denies chest pain or difficulty breathing. No radiation of symptoms.    Past Medical History:  Diagnosis Date  . Aortic insufficiency   . Arthritis    "bad; probably all over; lower back; hands, not RA" (06/23/2012)  . Blood dyscrasia    IGg  kappa  . Diverticulosis   . Fall at home    "fell in the bathtub yesterday; bruised my left arm" (06/23/2012)  . GERD (gastroesophageal reflux disease)   . High cholesterol   . Hypertension   . Mitral valve disorder   . Osteoarthritis   . Varicose veins     Patient Active Problem List   Diagnosis Date Noted  . 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 Chronic venous insufficiency 12/28/2014  . Varicose veins of leg with complications 25/95/6387  . Venous stasis ulcer of left lower extremity ([place]) 12/28/2014  . Onychomycosis 05/02/2014  . Pain in lower limb 05/02/2014  . Cellulitis 03/03/2014  . Diverticulosis 11/09/2013  . Colon, diverticulosis 11/09/2013  . Cholelithiasis 10/17/2013  . Pancreatic mass 10/12/2013  . Disease of pancreas 10/12/2013  . Hyperlipidemia 09/19/2013  . GERD (gastroesophageal reflux disease) 09/19/2013  . S/P hysterectomy 09/19/2013  . Osteoporosis 09/19/2013  . H/O total hysterectomy 09/19/2013  . Herpes zona 03/07/2013  . Malaise and fatigue 03/07/2013  . Chronic cardiopulmonary disease ([place]) 01/28/2013  . Can't get food down 01/28/2013  . Acid reflux 01/28/2013  . Cephalalgia  01/28/2013  . Elevated WBC count 01/28/2013  . Disorder of mitral valve 01/28/2013  . Muscle ache 01/28/2013  . Obstructive apnea 01/28/2013  . Arthritis or polyarthritis, rheumatoid ([place]) 01/28/2013  . Temporary cerebral vascular dysfunction 01/28/2013  . Arthritis of shoulder region, left 06/27/2012  . Disorder of shoulder 06/27/2012  . Back ache 12/04/2011  . AI (aortic insufficiency) 08/27/2011  . Arthritis 08/27/2011  . Hx-TIA (transient ischemic attack) 08/27/2011  . Aortic valve defect 08/27/2011  . Arthropathia 08/27/2011  . Cerebral infarct ([place]) 08/27/2011  . IgM monoclonal gammopathy of uncertain significance 08/26/2011  . Hypertension 08/26/2011  . Incarcerated femoral hernia 08/26/2011  . Elevated cholesterol 08/26/2011  . Essential (primary) hypertension 08/26/2011  . Femoral hernia with obstruction 08/26/2011  . Hypercholesteremia 08/26/2011  . Monoclonal paraproteinemia 08/26/2011    Past Surgical History:  Procedure Laterality Date  . CATARACT EXTRACTION W/ INTRAOCULAR LENS  IMPLANT, BILATERAL  ~ 2003  . HERNIA REPAIR    . LAPAROSCOPY  08/25/2011   Procedure: LAPAROSCOPY DIAGNOSTIC;  Surgeon: Judieth Keens, DO;  Location: [facility];  Service: General;  Laterality: N/A;  reduction obturator hernia obturator hernia repair  . PATELLA FRACTURE SURGERY  2011   right  . TOTAL SHOULDER ARTHROPLASTY  06/23/2012   left  . TOTAL SHOULDER ARTHROPLASTY  06/23/2012   Procedure: TOTAL SHOULDER ARTHROPLASTY;  Surgeon: Marin Shutter, MD;  Location: [facility];  Service: Orthopedics;  Laterality: Left;  left total shoulder arthroplasty  . VAGINAL HYSTERECTOMY  ~ 1996  Current Outpatient Rx  . Order #: [id number] Class: Historical Med  . Order #: [id number] Class: Historical Med  . Order #: [id number] Class: Historical Med  . Order #: [id number] Class: Historical Med  . Order #: [id number] Class: Print  . Order #: [id number] Class: Historical Med  . Order #: [id number] Class:  Historical Med  . Order #: [id number] Class: Normal  . Order #: [id number] Class: Normal  . Order #: [id number] Class: Historical Med  . Order #: [id number] Class: Normal  . Order #: [id number] Class: Historical Med  . Order #: [id number] Class: Historical Med  . Order #: [id number] Class: Normal  . Order #: [id number] Class: Print    Allergies Zyrtec [cetirizine hcl] and Cetirizine  Family History  Problem Relation Age of Onset  . Heart disease Father   . Breast cancer Mother     Social History Social History  Substance Use Topics  . Smoking status: Never Smoker  . Smokeless tobacco: Never Used     Comment: never used tobacco  . Alcohol use No     Comment: Rare    Review of Systems  Constitutional: No fever/chills Eyes: No visual changes. ENT: No sore throat. Cardiovascular: Denies chest pain. Respiratory: Denies shortness of breath. Gastrointestinal: No abdominal pain.  No nausea, no vomiting.  No diarrhea.  No constipation. Genitourinary: Positive for dysuria. Musculoskeletal: Negative for back pain. Skin: Negative for rash. Neurological: Negative for headaches, focal weakness or numbness.  10-point ROS otherwise negative.  ____________________________________________   PHYSICAL EXAM:  VITAL SIGNS: ED Triage Vitals  Enc Vitals Group     BP 04/26/17 1330 114/69     Pulse Rate 04/26/17 1330 69     Resp 04/26/17 1330 18     Temp 04/26/17 1330 98.9 F (37.2 C)     Temp src --      SpO2 04/26/17 1330 96 %     Weight 04/26/17 1331 104 lb (47.2 kg)     Height 04/26/17 1329 5\' 3"  (1.6 m)     Pain Score 04/26/17 1329 10    Constitutional: Alert with some mild confusion. No acute distress. Ambulatory without difficulty.  Eyes: Conjunctivae are normal.  Head: Atraumatic. Nose: No congestion/rhinnorhea. Mouth/Throat: Mucous membranes are moist. Neck: No stridor.   Cardiovascular: Normal rate, regular rhythm. Good peripheral circulation. Grossly normal heart sounds.     Respiratory: Normal respiratory effort.  No retractions. Lungs CTAB. Gastrointestinal: Soft with mild LLQ tenderness to palpation. Poorly reproducible. No rebound or guarding. No distention.  Musculoskeletal: No lower extremity tenderness nor edema. No gross deformities of extremities. Neurologic:  Normal speech and language. No gross focal neurologic deficits are appreciated.  Skin:  Skin is warm, dry and intact. No rash noted.  ____________________________________________   LABS (all labs ordered are listed, but only abnormal results are displayed)  Labs Reviewed  URINALYSIS, ROUTINE W REFLEX MICROSCOPIC - Abnormal; Notable for the following:       Result Value   APPearance HAZY (*)    Hgb urine dipstick TRACE (*)    Leukocytes, UA TRACE (*)    All other components within normal limits  COMPREHENSIVE METABOLIC PANEL - Abnormal; Notable for the following:    Sodium 134 (*)    Chloride 95 (*)    Glucose, Bld 103 (*)    BUN 21 (*)    Albumin 3.4 (*)    ALT 10 (*)    GFR calc non Af Amer 53 (*)    All other components within normal limits  CBC WITH DIFFERENTIAL/PLATELET - Abnormal; Notable for  the following:    WBC 11.9 (*)    RBC 3.34 (*)    Hemoglobin 10.6 (*)    HCT 32.5 (*)    Neutro Abs 9.5 (*)    Monocytes Absolute 1.2 (*)    All other components within normal limits  URINALYSIS, MICROSCOPIC (REFLEX) - Abnormal; Notable for the following:    Bacteria, UA RARE (*)    Squamous Epithelial / LPF 0-5 (*)    All other components within normal limits  URINE CULTURE   ____________________________________________  RADIOLOGY  None ____________________________________________   PROCEDURES  Procedure(s) performed:   Procedures  None ____________________________________________   INITIAL IMPRESSION / ASSESSMENT AND PLAN / ED COURSE  Pertinent labs & imaging results that were available during my care of the patient were reviewed by me and considered in my medical  decision making (see chart for details).  Patient presents to the emergency department for evaluation of dysuria. No fevers or chills. Patient with some mild confusion. She does have some left lower quadrant tenderness to palpation but this is mild not easily reproduced. No indication at this time for CT. Plan to UA and labs.   03:43 PM Patient with mild leukocytosis on CBC and trance leukocytes on UA. With significant UTI symptoms plan to cover with Keflex and send urine for culture, which was done.   At this time, I do not feel there is any life-threatening condition present. I have reviewed and discussed all results (EKG, imaging, lab, urine as appropriate), exam findings with patient. I have reviewed nursing notes and appropriate previous records.  I feel the patient is safe to be discharged home without further emergent workup. Discussed usual and customary return precautions. Patient and family (if present) verbalize understanding and are comfortable with this plan.  Patient will follow-up with their primary care provider. If they do not have a primary care provider, information for follow-up has been provided to them. All questions have been answered.  ____________________________________________  FINAL CLINICAL IMPRESSION(S) / ED DIAGNOSES  Final diagnoses:  Dysuria     MEDICATIONS GIVEN DURING THIS VISIT:  Medications - No data to display   NEW OUTPATIENT MEDICATIONS STARTED DURING THIS VISIT:  New Prescriptions   CEPHALEXIN (KEFLEX) 500 MG CAPSULE    Take 1 capsule (500 mg total) by mouth 2 (two) times daily.    Note:  This document was prepared using Dragon voice recognition software and may include unintentional dictation errors.  Nanda Quinton, MD Emergency Medicine    Long, [facility], MD 04/26/17 [phone]

## 2017-04-27 LAB — URINE CULTURE: Culture: NO GROWTH

## 2017-07-24 DEATH — deceased

## 2017-07-28 ENCOUNTER — Ambulatory Visit: Payer: Medicare Other | Admitting: Hematology & Oncology

## 2017-07-28 ENCOUNTER — Other Ambulatory Visit: Payer: Medicare Other
